# Patient Record
Sex: Female | Born: 1945 | Race: White | Hispanic: No | Smoking: Never smoker
Health system: Southern US, Community
[De-identification: ages and names within clinical notes are randomized; demographics above are authoritative.]

## PROBLEM LIST (undated history)

## (undated) DIAGNOSIS — M797 Fibromyalgia: Secondary | ICD-10-CM

## (undated) DIAGNOSIS — E0500 Thyrotoxicosis with diffuse goiter without thyrotoxic crisis or storm: Secondary | ICD-10-CM

## (undated) DIAGNOSIS — H47399 Other disorders of optic disc, unspecified eye: Secondary | ICD-10-CM

## (undated) DIAGNOSIS — I2510 Atherosclerotic heart disease of native coronary artery without angina pectoris: Secondary | ICD-10-CM

## (undated) DIAGNOSIS — H509 Unspecified strabismus: Secondary | ICD-10-CM

## (undated) DIAGNOSIS — H269 Unspecified cataract: Secondary | ICD-10-CM

## (undated) DIAGNOSIS — J342 Deviated nasal septum: Secondary | ICD-10-CM

## (undated) DIAGNOSIS — Z947 Corneal transplant status: Secondary | ICD-10-CM

## (undated) DIAGNOSIS — M8580 Other specified disorders of bone density and structure, unspecified site: Secondary | ICD-10-CM

## (undated) DIAGNOSIS — M810 Age-related osteoporosis without current pathological fracture: Secondary | ICD-10-CM

## (undated) DIAGNOSIS — R5382 Chronic fatigue, unspecified: Secondary | ICD-10-CM

## (undated) DIAGNOSIS — M5020 Other cervical disc displacement, unspecified cervical region: Secondary | ICD-10-CM

## (undated) DIAGNOSIS — J40 Bronchitis, not specified as acute or chronic: Secondary | ICD-10-CM

## (undated) DIAGNOSIS — I639 Cerebral infarction, unspecified: Secondary | ICD-10-CM

## (undated) DIAGNOSIS — G2581 Restless legs syndrome: Secondary | ICD-10-CM

## (undated) DIAGNOSIS — Z9889 Other specified postprocedural states: Secondary | ICD-10-CM

## (undated) DIAGNOSIS — C801 Malignant (primary) neoplasm, unspecified: Secondary | ICD-10-CM

## (undated) DIAGNOSIS — M1990 Unspecified osteoarthritis, unspecified site: Secondary | ICD-10-CM

## (undated) DIAGNOSIS — D869 Sarcoidosis, unspecified: Secondary | ICD-10-CM

## (undated) DIAGNOSIS — J189 Pneumonia, unspecified organism: Secondary | ICD-10-CM

## (undated) DIAGNOSIS — B1920 Unspecified viral hepatitis C without hepatic coma: Secondary | ICD-10-CM

## (undated) DIAGNOSIS — I10 Essential (primary) hypertension: Secondary | ICD-10-CM

## (undated) DIAGNOSIS — Z961 Presence of intraocular lens: Secondary | ICD-10-CM

## (undated) DIAGNOSIS — H0520 Unspecified exophthalmos: Secondary | ICD-10-CM

## (undated) DIAGNOSIS — M4310 Spondylolisthesis, site unspecified: Secondary | ICD-10-CM

## (undated) DIAGNOSIS — T7840XA Allergy, unspecified, initial encounter: Secondary | ICD-10-CM

## (undated) HISTORY — DX: Atherosclerotic heart disease of native coronary artery without angina pectoris: I25.10

## (undated) HISTORY — PX: SPINE SURGERY: SHX786

## (undated) HISTORY — DX: Chronic fatigue, unspecified: R53.82

## (undated) HISTORY — DX: Other specified postprocedural states: Z98.890

## (undated) HISTORY — DX: Pneumonia, unspecified organism: J18.9

## (undated) HISTORY — DX: Sarcoidosis, unspecified: D86.9

## (undated) HISTORY — DX: Essential (primary) hypertension: I10

## (undated) HISTORY — DX: Corneal transplant status: Z94.7

## (undated) HISTORY — PX: COLONOSCOPY: SHX174

## (undated) HISTORY — DX: Cerebral infarction, unspecified: I63.9

## (undated) HISTORY — DX: Deviated nasal septum: J34.2

## (undated) HISTORY — DX: Unspecified strabismus: H50.9

## (undated) HISTORY — DX: Other cervical disc displacement, unspecified cervical region: M50.20

## (undated) HISTORY — DX: Other disorders of optic disc, unspecified eye: H47.399

## (undated) HISTORY — DX: Presence of intraocular lens: Z96.1

## (undated) HISTORY — DX: Other specified disorders of bone density and structure, unspecified site: M85.80

## (undated) HISTORY — DX: Unspecified cataract: H26.9

## (undated) HISTORY — DX: Unspecified osteoarthritis, unspecified site: M19.90

## (undated) HISTORY — DX: Fibromyalgia: M79.7

## (undated) HISTORY — DX: Bronchitis, not specified as acute or chronic: J40

## (undated) HISTORY — DX: Unspecified exophthalmos: H05.20

## (undated) HISTORY — DX: Malignant (primary) neoplasm, unspecified: C80.1

## (undated) HISTORY — DX: Unspecified viral hepatitis C without hepatic coma: B19.20

## (undated) HISTORY — DX: Age-related osteoporosis without current pathological fracture: M81.0

## (undated) HISTORY — PX: EYE SURGERY: SHX253

## (undated) HISTORY — DX: Restless legs syndrome: G25.81

## (undated) HISTORY — DX: Thyrotoxicosis with diffuse goiter without thyrotoxic crisis or storm: E05.00

## (undated) HISTORY — DX: Spondylolisthesis, site unspecified: M43.10

## (undated) HISTORY — DX: Allergy, unspecified, initial encounter: T78.40XA

---

## 1972-07-24 HISTORY — PX: OTHER SURGICAL HISTORY: SHX169

## 1973-07-24 HISTORY — PX: CHOLECYSTECTOMY: SHX55

## 1978-07-24 DIAGNOSIS — D869 Sarcoidosis, unspecified: Secondary | ICD-10-CM

## 1978-07-24 HISTORY — DX: Sarcoidosis, unspecified: D86.9

## 1984-07-24 DIAGNOSIS — M5020 Other cervical disc displacement, unspecified cervical region: Secondary | ICD-10-CM

## 1984-07-24 HISTORY — PX: LAMINECTOMY: SHX219

## 1984-07-24 HISTORY — PX: LUMBAR DISC SURGERY: SHX700

## 1984-07-24 HISTORY — DX: Other cervical disc displacement, unspecified cervical region: M50.20

## 1992-07-24 DIAGNOSIS — J189 Pneumonia, unspecified organism: Secondary | ICD-10-CM

## 1992-07-24 DIAGNOSIS — J40 Bronchitis, not specified as acute or chronic: Secondary | ICD-10-CM

## 1992-07-24 HISTORY — DX: Pneumonia, unspecified organism: J18.9

## 1992-07-24 HISTORY — DX: Bronchitis, not specified as acute or chronic: J40

## 1998-07-24 DIAGNOSIS — Z9889 Other specified postprocedural states: Secondary | ICD-10-CM

## 1998-07-24 HISTORY — PX: OTHER SURGICAL HISTORY: SHX169

## 1998-07-24 HISTORY — PX: ABDOMINAL HYSTERECTOMY: SHX81

## 1998-07-24 HISTORY — DX: Other specified postprocedural states: Z98.890

## 1999-07-25 DIAGNOSIS — E0500 Thyrotoxicosis with diffuse goiter without thyrotoxic crisis or storm: Secondary | ICD-10-CM

## 1999-07-25 HISTORY — DX: Thyrotoxicosis with diffuse goiter without thyrotoxic crisis or storm: E05.00

## 2003-07-25 DIAGNOSIS — M8580 Other specified disorders of bone density and structure, unspecified site: Secondary | ICD-10-CM

## 2003-07-25 DIAGNOSIS — M797 Fibromyalgia: Secondary | ICD-10-CM

## 2003-07-25 DIAGNOSIS — I2510 Atherosclerotic heart disease of native coronary artery without angina pectoris: Secondary | ICD-10-CM

## 2003-07-25 DIAGNOSIS — H47399 Other disorders of optic disc, unspecified eye: Secondary | ICD-10-CM

## 2003-07-25 DIAGNOSIS — R5382 Chronic fatigue, unspecified: Secondary | ICD-10-CM

## 2003-07-25 DIAGNOSIS — G2581 Restless legs syndrome: Secondary | ICD-10-CM

## 2003-07-25 HISTORY — DX: Other disorders of optic disc, unspecified eye: H47.399

## 2003-07-25 HISTORY — DX: Chronic fatigue, unspecified: R53.82

## 2003-07-25 HISTORY — DX: Atherosclerotic heart disease of native coronary artery without angina pectoris: I25.10

## 2003-07-25 HISTORY — DX: Restless legs syndrome: G25.81

## 2003-07-25 HISTORY — DX: Other specified disorders of bone density and structure, unspecified site: M85.80

## 2003-07-25 HISTORY — DX: Fibromyalgia: M79.7

## 2007-07-25 DIAGNOSIS — Z961 Presence of intraocular lens: Secondary | ICD-10-CM

## 2007-07-25 HISTORY — PX: CARPAL TUNNEL RELEASE: SHX101

## 2007-07-25 HISTORY — DX: Presence of intraocular lens: Z96.1

## 2008-07-24 DIAGNOSIS — Z947 Corneal transplant status: Secondary | ICD-10-CM

## 2008-07-24 HISTORY — DX: Corneal transplant status: Z94.7

## 2009-07-24 DIAGNOSIS — H0520 Unspecified exophthalmos: Secondary | ICD-10-CM

## 2009-07-24 HISTORY — DX: Unspecified exophthalmos: H05.20

## 2009-07-24 HISTORY — PX: TOTAL THYROIDECTOMY: SHX2547

## 2011-03-25 DIAGNOSIS — J342 Deviated nasal septum: Secondary | ICD-10-CM

## 2011-03-25 HISTORY — DX: Deviated nasal septum: J34.2

## 2011-03-25 HISTORY — PX: OTHER SURGICAL HISTORY: SHX169

## 2011-07-25 DIAGNOSIS — H509 Unspecified strabismus: Secondary | ICD-10-CM

## 2011-07-25 HISTORY — DX: Unspecified strabismus: H50.9

## 2012-05-24 DIAGNOSIS — Z9889 Other specified postprocedural states: Secondary | ICD-10-CM

## 2012-05-24 HISTORY — DX: Other specified postprocedural states: Z98.890

## 2012-05-24 HISTORY — PX: SECONDARY INTRAOCULAR LENSE IMPLANTATION: SHX2390

## 2017-04-23 DIAGNOSIS — I2510 Atherosclerotic heart disease of native coronary artery without angina pectoris: Secondary | ICD-10-CM | POA: Insufficient documentation

## 2018-05-22 NOTE — Progress Notes (Signed)
Office Visit Note  Patient: Brooke Sanders             Date of Birth: 01-10-1946           MRN: [id number]             PCP: Jamesetta Orleans, PA-C Referring: Nickola Major Visit Date: 05/30/2018 Occupation: @GUAROCC @  Subjective:  Pain in multiple joints   History of Present Illness: Brooke Sanders is a 72 y.o. female seen in consultation per request of her PCP.  According to patient in 1980 she developed some cervical lymph nodes which were biopsied and they were consistent with sarcoidosis.  She was given prednisone but it was discontinued due to reaction to the prednisone.  She had followed by a pulmonologist as she developed pneumonia.  She was again given a course of prednisone but discontinued due to side effects.  She had repeated x-rays after that which she eventually became clear in 2002.  In 2005 she was diagnosed with fibromyalgia and chronic fatigue syndrome.  In 2012 she was started on Reclast infusions for osteoporosis.  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  In 2016 she had laminectomy.  And eventually fusion.  She was seen by a rheumatologist in 2015 who was treating her for fibromyalgia and sarcoidosis.  She was given Plaquenil for couple of years and then discontinued.  She was also treated with Lyrica for pain management.  That she has pain in all of her joints.  She also has generalized pain from fibromyalgia.  She has increased discomfort in her knee joints.  She states she has some swelling in her knee joints off and on.  She has been experiencing  increased fatigue.  She moved to [place] 1-1/2-year ago from [place].  Activities of Daily Living:  Patient reports morning stiffness for 30 minutes.   Patient Reports nocturnal pain.  Difficulty dressing/grooming: Denies Difficulty climbing stairs: Denies Difficulty getting out of chair: Denies Difficulty using hands for taps, buttons, cutlery,  and/or writing: Reports  Review of Systems  Constitutional: Positive for fatigue. Negative for night sweats, weight gain and weight loss.  HENT: Positive for mouth dryness. Negative for mouth sores, trouble swallowing, trouble swallowing and nose dryness.   Eyes: Positive for dryness. Negative for pain, redness and visual disturbance.  Respiratory: Negative for cough, shortness of breath and difficulty breathing.   Cardiovascular: Negative for chest pain, palpitations, hypertension, irregular heartbeat and swelling in legs/feet.  Gastrointestinal: Negative for blood in stool, constipation and diarrhea.  Endocrine: Negative for increased urination.  Genitourinary: Negative for difficulty urinating and vaginal dryness.  Musculoskeletal: Positive for arthralgias, joint pain, myalgias, morning stiffness and myalgias. Negative for joint swelling, muscle weakness and muscle tenderness.  Skin: Positive for rash and hair loss. Negative for color change, skin tightness, ulcers and sensitivity to sunlight.       Hives with change in barometric pressure  Allergic/Immunologic: Negative for susceptible to infections.  Neurological: Positive for numbness, headaches and weakness. Negative for dizziness, memory loss and night sweats.  Hematological: Negative for swollen glands.  Psychiatric/Behavioral: Positive for depressed mood and sleep disturbance. The patient is not nervous/anxious.     PMFS History:  There are no active problems to display for this patient.   Past Medical History:  Diagnosis Date  . Bilateral artificial lens implant 2009  . Bronchitis 1994  . Cervical herniated disc 1986  . Chronic  fatigue 2005  . 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 Coronary artery disease 2005  . Deviated septum 03/2011  . Fibromyalgia 2005  . Graves disease 2001  . Hepatitis C   . History of coronary angiogram 05/2012  . Hx of LASIK 2000  . Optic disc hemorrhage 2005  . Osteopenia 2005  . Pneumonia  1994  . Proptosis 2011  . Restless leg syndrome 2005  . Sarcoidosis 1980  . Spondylolisthesis   . Strabismus 2013    History reviewed. No pertinent family history. Past Surgical History:  Procedure Laterality Date  . ABDOMINAL HYSTERECTOMY  2000  . CARPAL TUNNEL RELEASE Right 2009  . CHOLECYSTECTOMY  1975  . cystic ectopic ovary  2000  . LAMINECTOMY  1986  . Dennis  . orbital decompression Bilateral 03/2011  . ossified ganglion surgery Right 1974  . SECONDARY INTRAOCULAR LENSE IMPLANTATION Right 05/2012  . TOTAL THYROIDECTOMY  2011   Social History   Social History Narrative  . Not on file    Objective: Vital Signs: BP 133/78 (BP Location: Left Arm, Patient Position: Sitting, Cuff Size: Normal)   Pulse 79   Ht 5\' 1"  (1.549 m)   Wt 140 lb (63.5 kg)   BMI 26.45 kg/m    Physical Exam  Constitutional: She is oriented to person, place, and time. She appears well-developed and well-nourished.  HENT:  Head: Normocephalic and atraumatic.  Eyes: Conjunctivae and EOM are normal.  Neck: Normal range of motion.  Cardiovascular: Normal rate, regular rhythm, normal heart sounds and intact distal pulses.  Pulmonary/Chest: Effort normal and breath sounds normal.  Abdominal: Soft. Bowel sounds are normal.  Lymphadenopathy:    She has no cervical adenopathy.  Neurological: She is alert and oriented to person, place, and time.  Skin: Skin is warm and dry. Capillary refill takes less than 2 seconds.  Psychiatric: She has a normal mood and affect. Her behavior is normal.  Nursing note and vitals reviewed.    Musculoskeletal Exam: C-spine good range of motion.  She has limited painful range of motion of lumbar spine.  Shoulder joints elbow joints wrist joints were in good range of motion.  She has some DIP and PIP thickening.  No synovitis was noted.  Hip joints were in good range of motion.  She is crepitus in her knee joints without any warmth swelling or effusion.   Ankle joints MTPs PIPs were in good range of motion.  She had generalized hyperalgesia.   CDAI Exam: CDAI Score: Not documented Patient Global Assessment: Not documented; Provider Global Assessment: Not documented Swollen: Not documented; Tender: Not documented Joint Exam   Not documented   There is currently no information documented on the homunculus. Go to the Rheumatology activity and complete the homunculus joint exam.  Investigation: No additional findings.  Imaging: Xr Hand 2 View Left  Result Date: 05/30/2018 CMC, PIP and DIP narrowing was noted.  Severe narrowing of first and second DIP narrowing with subluxation was noted.  No MCP, intercarpal radiocarpal joint space narrowing was noted.  No erosive changes were noted. Impression: These findings are consistent with osteoarthritis of the hand.  Xr Hand 2 View Right  Result Date: 05/30/2018 PIP and DIP narrowing was noted.  No MCP joint narrowing was noted.  No intercarpal radiocarpal joint space narrowing was noted.  No erosive changes were noted. Impression: These findings are consistent with osteoarthritis of the hand.  Xr Knee 3 View Left  Result Date:  05/30/2018 Moderate medial compartment narrowing.  No chondrocalcinosis was noted.  Moderate patellofemoral narrowing was noted. 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  Xr Knee 3 View Right  Result Date: 05/30/2018 Moderate medial compartment narrowing.  No chondrocalcinosis was noted.  Moderate patellofemoral narrowing was noted. 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   Recent Labs: No results found for: WBC, HGB, PLT, NA, K, CL, CO2, GLUCOSE, BUN, CREATININE, BILITOT, ALKPHOS, AST, ALT, PROT, ALBUMIN, CALCIUM, GFRAA, QFTBGOLD, QFTBGOLDPLUS  Speciality Comments: No specialty comments available.  Procedures:  No procedures performed Allergies: Prednisone    Assessment / Plan:     Visit Diagnoses: Sarcoidosis -patient was diagnosed with sarcoidosis in 1980 after positive cervical lymph node biopsy.  She had one episode of pneumonia with possibility of sarcoidosis.  She was given prednisone but could not tolerate it.  She had no recurrence of the symptoms.  She had a yearly chest x-ray for a while.  She was also treated by rheumatologist with Plaquenil for couple of years.  She has no increased shortness of breath currently.  She had no palpable lymph nodes.  I do not see any synovitis on examination.  She has been experiencing arthralgias.  Plan: Urinalysis, Routine w reflex microscopic, Sedimentation rate, Angiotensin converting enzyme, DG Chest 2 View  Pain in both hands -clinical findings are consistent with osteoarthritis with DIP and PIP thickening.  No synovitis was noted.  Plan: XR Hand 2 View Right, XR Hand 2 View Left, x-ray of bilateral hands were consistent with osteoarthritis of the hand.  Sedimentation rate, Rheumatoid factor, ANA  Chronic pain of both knees -patient complains of chronic pain and discomfort in her bilateral knee joints.  No warmth swelling or effusion was noted.  She is crepitus in her bilateral knee joints.  Plan: XR KNEE 3 VIEW RIGHT, XR KNEE 3 VIEW LEFT.  The x-ray of the knee joint showed moderate osteoarthritis and moderate chondral malacia patella.  Fibromyalgia-she has long-standing history of fibromyalgia with generalized pain and discomfort.  She has been treated in [place] for many years with the different medications.  Spinal stenosis of lumbar region without neurogenic claudication-patient had several surgeries including laminectomies and fusion of her lumbar spine.  Chronic fatigue - Plan: CBC with Differential/Platelet, COMPLETE METABOLIC PANEL WITH GFR, CK  History of hypothyroidism  History of coronary artery disease  Essential hypertension-her systolic blood pressure is mildly elevated  today.  History of hyperlipidemia  Age-related osteoporosis without current pathological fracture -patient reports that osteoporosis was induced due to treatment of Graves' disease.  She was also on Lyrica for many years.  Reclast this started in 2012   Orders: Orders Placed This Encounter  Procedures  . XR Hand 2 View Right  . XR Hand 2 View Left  . XR KNEE 3 VIEW RIGHT  . XR KNEE 3 VIEW LEFT  . DG Chest 2 View  . CBC with Differential/Platelet  . COMPLETE METABOLIC PANEL WITH GFR  . Urinalysis, Routine w reflex microscopic  . CK  . Sedimentation rate  . Rheumatoid factor  . ANA  . Angiotensin converting enzyme   No orders of the defined types were placed in this encounter.   Face-to-face time spent with patient was 50 minutes. Greater than 50% of time was spent in counseling and coordination of care.  Follow-Up Instructions: Return for sarcoidosis , OA, FMS.   Bo Merino, MD  Note - This record has  been created using [employer].  Chart creation errors have been sought, but may not always  have been located. Such creation errors do not reflect on  the standard of medical care.

## 2018-05-30 ENCOUNTER — Ambulatory Visit (INDEPENDENT_AMBULATORY_CARE_PROVIDER_SITE_OTHER): Payer: Medicare Other

## 2018-05-30 ENCOUNTER — Ambulatory Visit (INDEPENDENT_AMBULATORY_CARE_PROVIDER_SITE_OTHER): Payer: Medicare Other | Admitting: Rheumatology

## 2018-05-30 ENCOUNTER — Encounter: Payer: Self-pay | Admitting: Rheumatology

## 2018-05-30 ENCOUNTER — Ambulatory Visit (INDEPENDENT_AMBULATORY_CARE_PROVIDER_SITE_OTHER): Payer: Self-pay

## 2018-05-30 VITALS — BP 133/78 | HR 79 | Ht 61.0 in | Wt 140.0 lb

## 2018-05-30 DIAGNOSIS — M79642 Pain in left hand: Secondary | ICD-10-CM

## 2018-05-30 DIAGNOSIS — M79641 Pain in right hand: Secondary | ICD-10-CM

## 2018-05-30 DIAGNOSIS — Z8639 Personal history of other endocrine, nutritional and metabolic disease: Secondary | ICD-10-CM

## 2018-05-30 DIAGNOSIS — G8929 Other chronic pain: Secondary | ICD-10-CM | POA: Diagnosis not present

## 2018-05-30 DIAGNOSIS — D869 Sarcoidosis, unspecified: Secondary | ICD-10-CM | POA: Diagnosis not present

## 2018-05-30 DIAGNOSIS — M48061 Spinal stenosis, lumbar region without neurogenic claudication: Secondary | ICD-10-CM

## 2018-05-30 DIAGNOSIS — M797 Fibromyalgia: Secondary | ICD-10-CM | POA: Diagnosis not present

## 2018-05-30 DIAGNOSIS — M810 Age-related osteoporosis without current pathological fracture: Secondary | ICD-10-CM

## 2018-05-30 DIAGNOSIS — I10 Essential (primary) hypertension: Secondary | ICD-10-CM

## 2018-05-30 DIAGNOSIS — M25561 Pain in right knee: Secondary | ICD-10-CM

## 2018-05-30 DIAGNOSIS — R5382 Chronic fatigue, unspecified: Secondary | ICD-10-CM

## 2018-05-30 DIAGNOSIS — M25562 Pain in left knee: Secondary | ICD-10-CM | POA: Diagnosis not present

## 2018-05-30 DIAGNOSIS — Z8679 Personal history of other diseases of the circulatory system: Secondary | ICD-10-CM

## 2018-05-30 NOTE — Patient Instructions (Signed)
Knee Exercises Ask your health care provider which exercises are safe for you. Do exercises exactly as told by your health care provider and adjust them as directed. It is normal to feel mild stretching, pulling, tightness, or discomfort as you do these exercises, but you should stop right away if you feel sudden pain or your pain gets worse.Do not begin these exercises until told by your health care provider. STRETCHING AND RANGE OF MOTION EXERCISES These exercises warm up your muscles and joints and improve the movement and flexibility of your knee. These exercises also help to relieve pain, numbness, and tingling. Exercise A: Knee Extension, Prone 1. Lie on your abdomen on a bed. 2. Place your left / right knee just beyond the edge of the surface so your knee is not on the bed. You can put a towel under your left / right thigh just above your knee for comfort. 3. Relax your leg muscles and allow gravity to straighten your knee. You should feel a stretch behind your left / right knee. 4. Hold this position for __________ seconds. 5. Scoot up so your knee is supported between repetitions. Repeat __________ times. Complete this stretch __________ times a day. Exercise B: Knee Flexion, Active  1. Lie on your back with both knees straight. If this causes back discomfort, bend your left / right knee so your foot is flat on the floor. 2. Slowly slide your left / right heel back toward your buttocks until you feel a gentle stretch in the front of your knee or thigh. 3. Hold this position for __________ seconds. 4. Slowly slide your left / right heel back to the starting position. Repeat __________ times. Complete this exercise __________ times a day. Exercise C: Quadriceps, Prone  1. Lie on your abdomen on a firm surface, such as a bed or padded floor. 2. Bend your left / right knee and hold your ankle. If you cannot reach your ankle or pant leg, loop a belt around your foot and grab the belt  instead. 3. Gently pull your heel toward your buttocks. Your knee should not slide out to the side. You should feel a stretch in the front of your thigh and knee. 4. Hold this position for __________ seconds. Repeat __________ times. Complete this stretch __________ times a day. Exercise D: Hamstring, Supine 1. Lie on your back. 2. Loop a belt or towel over the ball of your left / right foot. The ball of your foot is on the walking surface, right under your toes. 3. Straighten your left / right knee and slowly pull on the belt to raise your leg until you feel a gentle stretch behind your knee. ? Do not let your left / right knee bend while you do this. ? Keep your other leg flat on the floor. 4. Hold this position for __________ seconds. Repeat __________ times. Complete this stretch __________ times a day. STRENGTHENING EXERCISES These exercises build strength and endurance in your knee. Endurance is the ability to use your muscles for a long time, even after they get tired. Exercise E: Quadriceps, Isometric  1. Lie on your back with your left / right leg extended and your other knee bent. Put a rolled towel or small pillow under your knee if told by your health care provider. 2. Slowly tense the muscles in the front of your left / right thigh. You should see your kneecap slide up toward your hip or see increased dimpling just above the knee. This   motion will push the back of the knee toward the floor. 3. For __________ seconds, keep the muscle as tight as you can without increasing your pain. 4. Relax the muscles slowly and completely. Repeat __________ times. Complete this exercise __________ times a day. Exercise F: Straight Leg Raises - Quadriceps 1. Lie on your back with your left / right leg extended and your other knee bent. 2. Tense the muscles in the front of your left / right thigh. You should see your kneecap slide up or see increased dimpling just above the knee. Your thigh may  even shake a bit. 3. Keep these muscles tight as you raise your leg 4-6 inches (10-15 cm) off the floor. Do not let your knee bend. 4. Hold this position for __________ seconds. 5. Keep these muscles tense as you lower your leg. 6. Relax your muscles slowly and completely after each repetition. Repeat __________ times. Complete this exercise __________ times a day. Exercise G: Hamstring, Isometric 1. Lie on your back on a firm surface. 2. Bend your left / right knee approximately __________ degrees. 3. Dig your left / right heel into the surface as if you are trying to pull it toward your buttocks. Tighten the muscles in the back of your thighs to dig as hard as you can without increasing any pain. 4. Hold this position for __________ seconds. 5. Release the tension gradually and allow your muscles to relax completely for __________ seconds after each repetition. Repeat __________ times. Complete this exercise __________ times a day. Exercise H: Hamstring Curls  If told by your health care provider, do this exercise while wearing ankle weights. Begin with __________ weights. Then increase the weight by 1 lb (0.5 kg) increments. Do not wear ankle weights that are more than __________. 1. Lie on your abdomen with your legs straight. 2. Bend your left / right knee as far as you can without feeling pain. Keep your hips flat against the floor. 3. Hold this position for __________ seconds. 4. Slowly lower your leg to the starting position.  Repeat __________ times. Complete this exercise __________ times a day. Exercise I: Squats (Quadriceps) 1. Stand in front of a table, with your feet and knees pointing straight ahead. You may rest your hands on the table for balance but not for support. 2. Slowly bend your knees and lower your hips like you are going to sit in a chair. ? Keep your weight over your heels, not over your toes. ? Keep your lower legs upright so they are parallel with the table  legs. ? Do not let your hips go lower than your knees. ? Do not bend lower than told by your health care provider. ? If your knee pain increases, do not bend as low. 3. Hold the squat position for __________ seconds. 4. Slowly push with your legs to return to standing. Do not use your hands to pull yourself to standing. Repeat __________ times. Complete this exercise __________ times a day. Exercise J: Wall Slides (Quadriceps)  1. Lean your back against a smooth wall or door while you walk your feet out 18-24 inches (46-61 cm) from it. 2. Place your feet hip-width apart. 3. Slowly slide down the wall or door until your knees bend __________ degrees. Keep your knees over your heels, not over your toes. Keep your knees in line with your hips. 4. Hold for __________ seconds. Repeat __________ times. Complete this exercise __________ times a day. Exercise K: Straight Leg Raises -   Hip Abductors 1. Lie on your side with your left / right leg in the top position. Lie so your head, shoulder, knee, and hip line up. You may bend your bottom knee to help you keep your balance. 2. Roll your hips slightly forward so your hips are stacked directly over each other and your left / right knee is facing forward. 3. Leading with your heel, lift your top leg 4-6 inches (10-15 cm). You should feel the muscles in your outer hip lifting. ? Do not let your foot drift forward. ? Do not let your knee roll toward the ceiling. 4. Hold this position for __________ seconds. 5. Slowly return your leg to the starting position. 6. Let your muscles relax completely after each repetition. Repeat __________ times. Complete this exercise __________ times a day. Exercise L: Straight Leg Raises - Hip Extensors 1. Lie on your abdomen on a firm surface. You can put a pillow under your hips if that is more comfortable. 2. Tense the muscles in your buttocks and lift your left / right leg about 4-6 inches (10-15 cm). Keep your knee  straight as you lift your leg. 3. Hold this position for __________ seconds. 4. Slowly lower your leg to the starting position. 5. Let your leg relax completely after each repetition. Repeat __________ times. Complete this exercise __________ times a day. This information is not intended to replace advice given to you by your health care provider. Make sure you discuss any questions you have with your health care provider. Document Released: 05/24/2005 Document Revised: 04/03/2016 Document Reviewed: 05/16/2015 Elsevier Interactive Patient Education  2018 [employer] Hand Exercises Hand exercises can be helpful to almost anyone. These exercises can strengthen the hands, improve flexibility and movement, and increase blood flow to the hands. These results can make work and daily tasks easier. Hand exercises can be especially helpful for people who have joint pain from arthritis or have nerve damage from overuse (carpal tunnel syndrome). These exercises can also help people who have injured a hand. Most of these hand exercises are fairly gentle stretching routines. You can do them often throughout the day. Still, it is a good idea to ask your health care provider which exercises would be best for you. Warming your hands before exercise may help to reduce stiffness. You can do this with gentle massage or by placing your hands in warm water for 15 minutes. Also, make sure you pay attention to your level of hand pain as you begin an exercise routine. Exercises Knuckle Bend Repeat this exercise 5-10 times with each hand. 1. Stand or sit with your arm, hand, and all five fingers pointed straight up. Make sure your wrist is straight. 2. Gently and slowly bend your fingers down and inward until the tips of your fingers are touching the tops of your palm. 3. Hold this position for a few seconds. 4. Extend your fingers out to their original position, all pointing straight up again.  Finger Fan Repeat this  exercise 5-10 times with each hand. 1. Hold your arm and hand out in front of you. Keep your wrist straight. 2. Squeeze your hand into a fist. 3. Hold this position for a few seconds. 4. Fan out, or spread apart, your hand and fingers as much as possible, stretching every joint fully.  Tabletop Repeat this exercise 5-10 times with each hand. 1. Stand or sit with your arm, hand, and all five fingers pointed straight up. Make sure your wrist is straight.   2. Gently and slowly bend your fingers at the knuckles where they meet the hand until your hand is making an upside-down L shape. Your fingers should form a tabletop. 3. Hold this position for a few seconds. 4. Extend your fingers out to their original position, all pointing straight up again.  Making Os Repeat this exercise 5-10 times with each hand. 1. Stand or sit with your arm, hand, and all five fingers pointed straight up. Make sure your wrist is straight. 2. Make an O shape by touching your pointer finger to your thumb. Hold for a few seconds. Then open your hand wide. 3. Repeat this motion with each finger on your hand.  Table Spread Repeat this exercise 5-10 times with each hand. 1. Place your hand on a table with your palm facing down. Make sure your wrist is straight. 2. Spread your fingers out as much as possible. Hold this position for a few seconds. 3. Slide your fingers back together again. Hold for a few seconds.  Ball Grip  Repeat this exercise 10-15 times with each hand. 1. Hold a tennis ball or another soft ball in your hand. 2. While slowly increasing pressure, squeeze the ball as hard as possible. 3. Squeeze as hard as you can for 3-5 seconds. 4. Relax and repeat.  Wrist Curls Repeat this exercise 10-15 times with each hand. 1. Sit in a chair that has armrests. 2. Hold a light weight in your hand, such as a dumbbell that weighs 1-3 pounds (0.5-1.4 kg). Ask your health care provider what weight would be best for  you. 3. Rest your hand just over the end of the chair arm with your palm facing up. 4. Gently pivot your wrist up and down while holding the weight. Do not twist your wrist from side to side.  Contact a health care provider if:  Your hand pain or discomfort gets much worse when you do an exercise.  Your hand pain or discomfort does not improve within 2 hours after you exercise. If you have any of these problems, stop doing these exercises right away. Do not do them again unless your health care provider says that you can. Get help right away if:  You develop sudden, severe hand pain. If this happens, stop doing these exercises right away. Do not do them again unless your health care provider says that you can. This information is not intended to replace advice given to you by your health care provider. Make sure you discuss any questions you have with your health care provider. Document Released: 06/21/2015 Document Revised: 12/16/2015 Document Reviewed: 01/18/2015 Elsevier Interactive Patient Education  2018 [employer]  

## 2018-06-03 LAB — CBC WITH DIFFERENTIAL/PLATELET
BASOS ABS: 42 {cells}/uL (ref 0–200)
Basophils Relative: 0.4 %
EOS PCT: 0.4 %
Eosinophils Absolute: 42 cells/uL (ref 15–500)
HCT: 40.6 % (ref 35.0–45.0)
HEMOGLOBIN: 13.9 g/dL (ref 11.7–15.5)
Lymphs Abs: 1924 cells/uL (ref 850–3900)
MCH: 30.2 pg (ref 27.0–33.0)
MCHC: 34.2 g/dL (ref 32.0–36.0)
MCV: 88.1 fL (ref 80.0–100.0)
MONOS PCT: 9.1 %
MPV: 11.1 fL (ref 7.5–12.5)
NEUTROS ABS: 7446 {cells}/uL (ref 1500–7800)
NEUTROS PCT: 71.6 %
PLATELETS: 267 10*3/uL (ref 140–400)
RBC: 4.61 10*6/uL (ref 3.80–5.10)
RDW: 11.5 % (ref 11.0–15.0)
Total Lymphocyte: 18.5 %
WBC mixed population: 946 cells/uL (ref 200–950)
WBC: 10.4 10*3/uL (ref 3.8–10.8)

## 2018-06-03 LAB — COMPLETE METABOLIC PANEL WITH GFR
AG Ratio: 1.8 (calc) (ref 1.0–2.5)
ALT: 14 U/L (ref 6–29)
AST: 22 U/L (ref 10–35)
Albumin: 4.8 g/dL (ref 3.6–5.1)
Alkaline phosphatase (APISO): 55 U/L (ref 33–130)
BUN: 14 mg/dL (ref 7–25)
CALCIUM: 10 mg/dL (ref 8.6–10.4)
CO2: 31 mmol/L (ref 20–32)
CREATININE: 0.86 mg/dL (ref 0.60–0.93)
Chloride: 88 mmol/L — ABNORMAL LOW (ref 98–110)
GFR, EST NON AFRICAN AMERICAN: 67 mL/min/{1.73_m2} (ref >=60)
GFR, Est African American: 78 mL/min/{1.73_m2} (ref >=60)
GLOBULIN: 2.6 g/dL (ref 1.9–3.7)
GLUCOSE: 87 mg/dL (ref 65–99)
Potassium: 3.7 mmol/L (ref 3.5–5.3)
SODIUM: 130 mmol/L — AB (ref 135–146)
Total Bilirubin: 0.9 mg/dL (ref 0.2–1.2)
Total Protein: 7.4 g/dL (ref 6.1–8.1)

## 2018-06-03 LAB — ANTI-NUCLEAR AB-TITER (ANA TITER)

## 2018-06-03 LAB — RHEUMATOID FACTOR: Rhuematoid fact SerPl-aCnc: <14 IU/mL (ref <14)

## 2018-06-03 LAB — URINALYSIS, ROUTINE W REFLEX MICROSCOPIC
BILIRUBIN URINE: NEGATIVE
Bacteria, UA: NONE SEEN /HPF
Glucose, UA: NEGATIVE
Hgb urine dipstick: NEGATIVE
Hyaline Cast: NONE SEEN /LPF
NITRITE: NEGATIVE
PROTEIN: NEGATIVE
SQUAMOUS EPITHELIAL / LPF: NONE SEEN /HPF (ref <=5)
Specific Gravity, Urine: 1.02 (ref 1.001–1.03)
pH: 5.5 (ref 5.0–8.0)

## 2018-06-03 LAB — ANA: Anti Nuclear Antibody(ANA): POSITIVE — AB

## 2018-06-03 LAB — ANGIOTENSIN CONVERTING ENZYME: Angiotensin-Converting Enzyme: 23 U/L (ref 9–67)

## 2018-06-03 LAB — CK: Total CK: 72 U/L (ref 29–143)

## 2018-06-03 LAB — SEDIMENTATION RATE: SED RATE: 9 mm/h (ref 0–30)

## 2018-06-03 NOTE — Progress Notes (Signed)
Will discuss labs at the fu visit.

## 2018-06-04 ENCOUNTER — Ambulatory Visit (HOSPITAL_BASED_OUTPATIENT_CLINIC_OR_DEPARTMENT_OTHER)
Admission: RE | Admit: 2018-06-04 | Discharge: 2018-06-04 | Disposition: A | Discharge status: Home / Self Care | Payer: Medicare Other | Source: Ambulatory Visit | Attending: Rheumatology | Admitting: Rheumatology

## 2018-06-04 DIAGNOSIS — D869 Sarcoidosis, unspecified: Secondary | ICD-10-CM

## 2018-06-04 NOTE — Progress Notes (Signed)
CXR normal.

## 2018-06-06 DIAGNOSIS — Z9109 Other allergy status, other than to drugs and biological substances: Secondary | ICD-10-CM | POA: Insufficient documentation

## 2018-06-24 DIAGNOSIS — M5136 Other intervertebral disc degeneration, lumbar region: Secondary | ICD-10-CM | POA: Insufficient documentation

## 2018-06-24 DIAGNOSIS — D869 Sarcoidosis, unspecified: Secondary | ICD-10-CM | POA: Insufficient documentation

## 2018-06-24 DIAGNOSIS — Z8679 Personal history of other diseases of the circulatory system: Secondary | ICD-10-CM | POA: Insufficient documentation

## 2018-06-24 DIAGNOSIS — M51369 Other intervertebral disc degeneration, lumbar region without mention of lumbar back pain or lower extremity pain: Secondary | ICD-10-CM | POA: Insufficient documentation

## 2018-06-24 DIAGNOSIS — E890 Postprocedural hypothyroidism: Secondary | ICD-10-CM | POA: Insufficient documentation

## 2018-06-24 DIAGNOSIS — M19041 Primary osteoarthritis, right hand: Secondary | ICD-10-CM | POA: Insufficient documentation

## 2018-06-24 DIAGNOSIS — M810 Age-related osteoporosis without current pathological fracture: Secondary | ICD-10-CM | POA: Insufficient documentation

## 2018-06-24 DIAGNOSIS — I10 Essential (primary) hypertension: Secondary | ICD-10-CM | POA: Insufficient documentation

## 2018-06-24 DIAGNOSIS — M797 Fibromyalgia: Secondary | ICD-10-CM | POA: Insufficient documentation

## 2018-06-24 DIAGNOSIS — M19042 Primary osteoarthritis, left hand: Secondary | ICD-10-CM

## 2018-06-24 DIAGNOSIS — M170 Bilateral primary osteoarthritis of knee: Secondary | ICD-10-CM | POA: Insufficient documentation

## 2018-06-24 DIAGNOSIS — Z8639 Personal history of other endocrine, nutritional and metabolic disease: Secondary | ICD-10-CM | POA: Insufficient documentation

## 2018-06-24 DIAGNOSIS — E039 Hypothyroidism, unspecified: Secondary | ICD-10-CM | POA: Insufficient documentation

## 2018-06-24 DIAGNOSIS — E785 Hyperlipidemia, unspecified: Secondary | ICD-10-CM | POA: Insufficient documentation

## 2018-06-24 NOTE — Progress Notes (Signed)
Office Visit Note  Patient: Brooke Sanders             Date of Birth: August 26, 1945           MRN: [id number]             PCP: Jamesetta Orleans, PA-C Referring: No ref. provider found Visit Date: 07/04/2018 Occupation: '@GUAROCC' @  Subjective:  Pain in joints   History of Present Illness: Brooke Sanders is a 72 y.o. female with history of sarcoidosis osteoarthritis and fibromyalgia.  Continues to have discomfort in her hands and knees due to underlying osteoarthritis.  Lower back pain also persist she has history of a spinal stenosis and had fusion x2.  She also has neck pain due to underlying disc disease per patient.  Her last bone density was in 2015.  She states she had Reclast from 2012 till 2015.  Activities of Daily Living:  Patient reports morning stiffness for 15 minutes.   Patient Denies nocturnal pain.  Difficulty dressing/grooming: Denies Difficulty climbing stairs: Denies Difficulty getting out of chair: Denies Difficulty using hands for taps, buttons, cutlery, and/or writing: Reports  Review of Systems  Constitutional: Negative for fatigue, night sweats, weight gain and weight loss.  HENT: Negative for mouth sores, trouble swallowing, trouble swallowing, mouth dryness and nose dryness.   Eyes: Positive for dryness. Negative for pain, redness and visual disturbance.  Respiratory: Negative for cough, shortness of breath and difficulty breathing.   Cardiovascular: Negative for chest pain, palpitations, hypertension, irregular heartbeat and swelling in legs/feet.  Gastrointestinal: Negative for blood in stool, constipation and diarrhea.  Endocrine: Negative for increased urination.  Genitourinary: Negative for difficulty urinating and vaginal dryness.  Musculoskeletal: Positive for arthralgias, joint pain, muscle weakness and morning stiffness. Negative for joint swelling, myalgias, muscle tenderness and myalgias.  Skin: Positive for rash. Negative for color change,  hair loss, skin tightness, ulcers and sensitivity to sunlight.  Allergic/Immunologic: Negative for susceptible to infections.  Neurological: Negative for dizziness, numbness, memory loss, night sweats and weakness.  Hematological: Negative for bruising/bleeding tendency and swollen glands.  Psychiatric/Behavioral: Negative for depressed mood and sleep disturbance. The patient is not nervous/anxious.     PMFS History:  Patient Active Problem List   Diagnosis Date Noted  . Sarcoidosis 06/24/2018  . Primary osteoarthritis of both hands 06/24/2018  . Primary osteoarthritis of both knees 06/24/2018  . DDD (degenerative disc disease), lumbar 06/24/2018  . Fibromyalgia 06/24/2018  . Age-related osteoporosis without current pathological fracture 06/24/2018  . Essential hypertension 06/24/2018  . Dyslipidemia 06/24/2018  . History of coronary artery disease 06/24/2018  . History of hypothyroidism 06/24/2018    Past Medical History:  Diagnosis Date  . Bilateral artificial lens implant 2009  . Bronchitis 1994  . Cervical herniated disc 1986  . Chronic fatigue 2005  . 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 Coronary artery disease 2005  . Deviated septum 03/2011  . Fibromyalgia 2005  . Graves disease 2001  . Hepatitis C   . History of coronary angiogram 05/2012  . Hx of LASIK 2000  . Optic disc hemorrhage 2005  . Osteopenia 2005  . Pneumonia 1994  . Proptosis 2011  . Restless leg syndrome 2005  . Sarcoidosis 1980  . Spondylolisthesis   . Strabismus 2013    History reviewed. No pertinent family history. Past Surgical History:  Procedure Laterality Date  . ABDOMINAL HYSTERECTOMY  2000  . CARPAL TUNNEL RELEASE Right 2009  . CHOLECYSTECTOMY  1975  .  cystic ectopic ovary  2000  . LAMINECTOMY  1986  . [place]  . orbital decompression Bilateral 03/2011  . ossified ganglion surgery Right 1974  . SECONDARY INTRAOCULAR LENSE IMPLANTATION Right 05/2012  . TOTAL  THYROIDECTOMY  2011   Social History   Social History Narrative  . Not on file    Objective: Vital Signs: BP 118/64 (BP Location: Left Arm, Patient Position: Sitting, Cuff Size: Normal)   Pulse 85   Resp 16   Ht '5\' 1"'  (1.549 m)   Wt 141 lb 9.6 oz (64.2 kg)   BMI 26.76 kg/m    Physical Exam Vitals signs and nursing note reviewed.  Constitutional:      Appearance: She is well-developed.  HENT:     Head: Normocephalic and atraumatic.  Eyes:     Conjunctiva/sclera: Conjunctivae normal.  Neck:     Musculoskeletal: Normal range of motion.  Cardiovascular:     Rate and Rhythm: Normal rate and regular rhythm.     Heart sounds: Normal heart sounds.  Pulmonary:     Effort: Pulmonary effort is normal.     Breath sounds: Normal breath sounds.  Abdominal:     General: Bowel sounds are normal.     Palpations: Abdomen is soft.  Lymphadenopathy:     Cervical: No cervical adenopathy.  Skin:    General: Skin is warm and dry.     Capillary Refill: Capillary refill takes less than 2 seconds.  Neurological:     Mental Status: She is alert and oriented to person, place, and time.  Psychiatric:        Behavior: Behavior normal.      Musculoskeletal Exam: She has limited range of motion of her cervical and lumbar spine.  Shoulder joints elbow joints wrist joints were in good range of motion.  She has bilateral PIP and DIP thickening consistent with osteoarthritis.  Hip joints and knee joints with good range of motion with no synovitis.  She had positive tender points on examination.  CDAI Exam: CDAI Score: Not documented Patient Global Assessment: Not documented; Provider Global Assessment: Not documented Swollen: Not documented; Tender: Not documented Joint Exam   Not documented   There is currently no information documented on the homunculus. Go to the Rheumatology activity and complete the homunculus joint exam.  Investigation: No additional findings.  Imaging: No results  found.  Recent Labs: Lab Results  Component Value Date   WBC 10.4 05/30/2018   HGB 13.9 05/30/2018   PLT 267 05/30/2018   NA 130 (L) 05/30/2018   K 3.7 05/30/2018   CL 88 (L) 05/30/2018   CO2 31 05/30/2018   GLUCOSE 87 05/30/2018   BUN 14 05/30/2018   CREATININE 0.86 05/30/2018   BILITOT 0.9 05/30/2018   AST 22 05/30/2018   ALT 14 05/30/2018   PROT 7.4 05/30/2018   CALCIUM 10.0 05/30/2018   GFRAA 78 05/30/2018   UA negative, ANA 1: 80 homogeneous, ACE 23, RF negative, ESR 9, CK 72 Speciality Comments: No specialty comments available.  Procedures:  No procedures performed Allergies: Prednisone   Assessment / Plan:     Visit Diagnoses: Sarcoidosis - Diagnosed in 1980 after positive cervical lymph node biopsy.  Treated by pulmonologist in the past with Plaquenil.  Her chest was clear to auscultation.  Her ACE level is normal.  Sedimentation rate is normal.  All the labs were reviewed with patient.  She has low titer positive ANA but no clinical features  of autoimmune disease at this point.  Primary osteoarthritis of both hands-joint protection muscle strengthening was discussed.  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  Muscle strengthening was discussed.  DDD (degenerative disc disease), cervical-she has limited range of motion with chronic discomfort.  DDD (degenerative disc disease), lumbar - With a spinal stenosis and neurogenic claudication.  status post fusion x2.  She continues to have some lower back pain.  Fibromyalgia-she has generalized pain positive tender points.  Need for regular exercise and stretching exercises were discussed.  Other fatigue-sleep hygiene was discussed.  It appears her fatigue may be related to insomnia.  Age-related osteoporosis without current pathological fracture - Due to Graves' disease per patient.  History of treatment with Lyrica.  Reclast since 2012. Last DXA in 2015.   I will schedule her bone density.  Use of calcium, vitamin D and resistive exercises was discussed.  Essential hypertension-her blood pressure is well controlled.  Dyslipidemia  History of coronary artery disease  History of hypothyroidism   Orders: Orders Placed This Encounter  Procedures  . DG BONE DENSITY (DXA)   No orders of the defined types were placed in this encounter.     Follow-Up Instructions: Return in about 6 months (around 01/03/2019) for sarcoidosis, , Osteoarthritis, Osteoporosis.   Bo Merino, MD  Note - This record has been created using Editor, commissioning.  Chart creation errors have been sought, but may not always  have been located. Such creation errors do not reflect on  the standard of medical care.

## 2018-07-04 ENCOUNTER — Ambulatory Visit (INDEPENDENT_AMBULATORY_CARE_PROVIDER_SITE_OTHER): Payer: Medicare Other | Admitting: Rheumatology

## 2018-07-04 ENCOUNTER — Encounter: Payer: Self-pay | Admitting: Rheumatology

## 2018-07-04 VITALS — BP 118/64 | HR 85 | Resp 16 | Ht 61.0 in | Wt 141.6 lb

## 2018-07-04 DIAGNOSIS — Z8639 Personal history of other endocrine, nutritional and metabolic disease: Secondary | ICD-10-CM

## 2018-07-04 DIAGNOSIS — M67431 Ganglion, right wrist: Secondary | ICD-10-CM | POA: Insufficient documentation

## 2018-07-04 DIAGNOSIS — M810 Age-related osteoporosis without current pathological fracture: Secondary | ICD-10-CM

## 2018-07-04 DIAGNOSIS — D869 Sarcoidosis, unspecified: Secondary | ICD-10-CM

## 2018-07-04 DIAGNOSIS — M19041 Primary osteoarthritis, right hand: Secondary | ICD-10-CM | POA: Diagnosis not present

## 2018-07-04 DIAGNOSIS — R5383 Other fatigue: Secondary | ICD-10-CM

## 2018-07-04 DIAGNOSIS — M5030 Other cervical disc degeneration, unspecified cervical region: Secondary | ICD-10-CM | POA: Diagnosis not present

## 2018-07-04 DIAGNOSIS — M797 Fibromyalgia: Secondary | ICD-10-CM

## 2018-07-04 DIAGNOSIS — E785 Hyperlipidemia, unspecified: Secondary | ICD-10-CM

## 2018-07-04 DIAGNOSIS — Z8679 Personal history of other diseases of the circulatory system: Secondary | ICD-10-CM

## 2018-07-04 DIAGNOSIS — M170 Bilateral primary osteoarthritis of knee: Secondary | ICD-10-CM | POA: Diagnosis not present

## 2018-07-04 DIAGNOSIS — I10 Essential (primary) hypertension: Secondary | ICD-10-CM

## 2018-07-04 DIAGNOSIS — M5136 Other intervertebral disc degeneration, lumbar region: Secondary | ICD-10-CM

## 2018-07-04 DIAGNOSIS — M19042 Primary osteoarthritis, left hand: Secondary | ICD-10-CM

## 2018-08-21 ENCOUNTER — Telehealth: Payer: Self-pay | Admitting: Rheumatology

## 2018-08-21 NOTE — Telephone Encounter (Signed)
Patient has been provided with information to [facility] so she may schedule her bone density Scan.

## 2018-08-21 NOTE — Telephone Encounter (Signed)
Patient left a voicemail stating she had to leave town for 3 weeks and was unable to schedule her bone density scan.  Patient is requesting a return call.

## 2018-09-03 ENCOUNTER — Ambulatory Visit (HOSPITAL_BASED_OUTPATIENT_CLINIC_OR_DEPARTMENT_OTHER)
Admission: RE | Admit: 2018-09-03 | Discharge: 2018-09-03 | Disposition: A | Discharge status: Home / Self Care | Payer: Medicare Other | Source: Ambulatory Visit | Attending: Rheumatology | Admitting: Rheumatology

## 2018-09-03 DIAGNOSIS — M810 Age-related osteoporosis without current pathological fracture: Secondary | ICD-10-CM | POA: Diagnosis not present

## 2018-09-03 NOTE — Progress Notes (Signed)
DXA is consistent with osteopenia.  She may try calcium, vitamin D and resistive exercises.  She will need repeat bone density in 2 years.

## 2018-12-24 NOTE — Progress Notes (Signed)
Office Visit Note  Patient: Brooke Sanders             Date of Birth: 1945-08-15           MRN: [id number]             PCP: Jamesetta Orleans, PA-C Referring: Nickola Major Visit Date: 01/02/2019 Occupation: @GUAROCC @  Subjective:  Pain in multiple joints.   History of Present Illness: Brooke Sanders is a 73 y.o. female with history of osteoarthritis.  She states she continues to have pain and stiffness in her hands, bilateral knee joints and her lower back.  She denies any shortness of breath.  She has longstanding history of fibromyalgia which causes generalized pain and discomfort.  Activities of Daily Living:  Patient reports morning stiffness for 5 minutes.   Patient Reports nocturnal pain.  Difficulty dressing/grooming: Denies Difficulty climbing stairs: Denies Difficulty getting out of chair: Denies Difficulty using hands for taps, buttons, cutlery, and/or writing: Reports  Review of Systems  Constitutional: Positive for fatigue. Negative for night sweats, weight gain and weight loss.  HENT: Negative for mouth sores, trouble swallowing, trouble swallowing, mouth dryness and nose dryness.   Eyes: Positive for dryness. Negative for pain, redness and visual disturbance.  Respiratory: Negative for cough, shortness of breath and difficulty breathing.   Cardiovascular: Negative for chest pain, palpitations, hypertension, irregular heartbeat and swelling in legs/feet.  Gastrointestinal: Negative for blood in stool, constipation and diarrhea.  Endocrine: Negative for excessive thirst and increased urination.  Genitourinary: Negative for painful urination and vaginal dryness.  Musculoskeletal: Positive for arthralgias, joint pain and morning stiffness. Negative for joint swelling, myalgias, muscle weakness, muscle tenderness and myalgias.  Skin: Negative for color change, rash, hair loss, skin tightness, ulcers and sensitivity to sunlight.  Allergic/Immunologic:  Negative for susceptible to infections.  Neurological: Negative for dizziness, memory loss, night sweats and weakness.  Hematological: Negative for bruising/bleeding tendency and swollen glands.  Psychiatric/Behavioral: Positive for depressed mood. Negative for sleep disturbance. The patient is not nervous/anxious.     PMFS History:  Patient Active Problem List   Diagnosis Date Noted  . Sarcoidosis 06/24/2018  . Primary osteoarthritis of both hands 06/24/2018  . Primary osteoarthritis of both knees 06/24/2018  . DDD (degenerative disc disease), lumbar 06/24/2018  . Fibromyalgia 06/24/2018  . Age-related osteoporosis without current pathological fracture 06/24/2018  . Essential hypertension 06/24/2018  . Dyslipidemia 06/24/2018  . History of coronary artery disease 06/24/2018  . History of hypothyroidism 06/24/2018    Past Medical History:  Diagnosis Date  . Bilateral artificial lens implant 2009  . Bronchitis 1994  . Cervical herniated disc 1986  . Chronic fatigue 2005  . 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 Coronary artery disease 2005  . Deviated septum 03/2011  . Fibromyalgia 2005  . Graves disease 2001  . Hepatitis C   . History of coronary angiogram 05/2012  . Hx of LASIK 2000  . Optic disc hemorrhage 2005  . Osteopenia 2005  . Pneumonia 1994  . Proptosis 2011  . Restless leg syndrome 2005  . Sarcoidosis 1980  . Spondylolisthesis   . Strabismus 2013    History reviewed. No pertinent family history. Past Surgical History:  Procedure Laterality Date  . ABDOMINAL HYSTERECTOMY  2000  . CARPAL TUNNEL RELEASE Right 2009  . CHOLECYSTECTOMY  1975  . cystic ectopic ovary  2000  . LAMINECTOMY  1986  . [place]  . orbital  decompression Bilateral 03/2011  . ossified ganglion surgery Right 1974  . SECONDARY INTRAOCULAR LENSE IMPLANTATION Right 05/2012  . TOTAL THYROIDECTOMY  2011   Social History   Social History Narrative  . Not on file     There is no immunization history on file for this patient.   Objective: Vital Signs: BP 136/73 (BP Location: Left Arm, Patient Position: Sitting, Cuff Size: Normal)   Pulse 75   Resp 16   Ht 5' 1.5" (1.562 m)   Wt 145 lb 12.8 oz (66.1 kg)   BMI 27.10 kg/m    Physical Exam Vitals signs and nursing note reviewed.  Constitutional:      Appearance: She is well-developed.  HENT:     Head: Normocephalic and atraumatic.  Eyes:     Conjunctiva/sclera: Conjunctivae normal.  Neck:     Musculoskeletal: Normal range of motion.  Cardiovascular:     Rate and Rhythm: Normal rate and regular rhythm.     Heart sounds: Normal heart sounds.  Pulmonary:     Effort: Pulmonary effort is normal.     Breath sounds: Normal breath sounds.  Abdominal:     General: Bowel sounds are normal.     Palpations: Abdomen is soft.  Lymphadenopathy:     Cervical: No cervical adenopathy.  Skin:    General: Skin is warm and dry.     Capillary Refill: Capillary refill takes less than 2 seconds.  Neurological:     Mental Status: She is alert and oriented to person, place, and time.  Psychiatric:        Behavior: Behavior normal.      Musculoskeletal Exam: C-spine thoracic spine was in good range of motion.  She has some discomfort range of motion of her lumbar spine.  Shoulder joints elbow joints wrist joints with good range of motion.  She has DIP and PIP thickening and some subluxation of DIPs due to underlying osteoarthritis.  No synovitis was noted.  Hip joints were in good range of motion.  She has crepitus in her knee joints without any warmth swelling or effusion.  She has some generalized hyperalgesia.  CDAI Exam: CDAI Score: - Patient Global: -; Provider Global: - Swollen: -; Tender: - Joint Exam   No joint exam has been documented for this visit   There is currently no information documented on the homunculus. Go to the Rheumatology activity and complete the homunculus joint exam.   Investigation: No additional findings.  Imaging: No results found.  Recent Labs: Lab Results  Component Value Date   WBC 10.4 05/30/2018   HGB 13.9 05/30/2018   PLT 267 05/30/2018   NA 130 (L) 05/30/2018   K 3.7 05/30/2018   CL 88 (L) 05/30/2018   CO2 31 05/30/2018   GLUCOSE 87 05/30/2018   BUN 14 05/30/2018   CREATININE 0.86 05/30/2018   BILITOT 0.9 05/30/2018   AST 22 05/30/2018   ALT 14 05/30/2018   PROT 7.4 05/30/2018   CALCIUM 10.0 05/30/2018   GFRAA 78 05/30/2018  UA negative, ANA 1: 80 homogeneous, CK 72, sed rate 9, RF negative, ACE negative  DXA 09/03/2018 The BMD measured at Femur Neck Left is 0.697 g/cm2 with a T-score of -2.4.  Speciality Comments: No specialty comments available.  Procedures:  No procedures performed Allergies: Prednisone   Assessment / Plan:     Visit Diagnoses: Sarcoidosis - Diagnosed in 1980 after positive cervical lymph node biopsy.ACE normal.  Her most recent chest x-ray was unremarkable.  She is currently not having any symptoms.  Patient states she was treated with Plaquenil in 1980s by rheumatologist.  Primary osteoarthritis of both hands -clinical and radiographic findings are consistent with osteoarthritis.  Plan: Joint protection muscle strengthening was discussed.  Use of diclofenac gel was discussed.  A handout on hand exercises was given.  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 -She continues to have pain and discomfort in her knee joints.  Plan: She may use diclofenac gel on her knees as well.  A handout on knee exercises was given.  DDD (degenerative disc disease), cervical - Plan: Exercises for cervical spine were demonstrated in the office.  DDD (degenerative disc disease), lumbar - Status post fusion x2 - Plan: A handout on back exercises was given.  Fibromyalgia -patient has longstanding history of fibromyalgia.  She has been doing regular exercises.  Good sleep hygiene was discussed.   Other fatigue -related to insomnia.  Good sleep hygiene was emphasized.  Age-related osteoporosis without current pathological fracture - Due to Graves' disease per patient.  History of treatment with Lyrica.  Reclast since 2012. Last DXA 08/2018 showed T score of minus 2.4. - Plan: Use of calcium and vitamin D was discussed.  She will need repeat bone density in 2 years.  Essential hypertension - Plan: Her blood pressure is well controlled.  Other medical problems are listed as follows:  Dyslipidemia   History of coronary artery disease   History of hypothyroidism  Orders: No orders of the defined types were placed in this encounter.  No orders of the defined types were placed in this encounter.   Face-to-face time spent with patient was 30 minutes. Greater than 50% of time was spent in counseling and coordination of care.  Follow-Up Instructions: Return in about 1 year (around 01/02/2020) for Sarcoidosis, Osteoarthritis.   Bo Merino, MD  Note - This record has been created using Editor, commissioning.  Chart creation errors have been sought, but may not always  have been located. Such creation errors do not reflect on  the standard of medical care.

## 2019-01-02 ENCOUNTER — Other Ambulatory Visit: Payer: Self-pay

## 2019-01-02 ENCOUNTER — Ambulatory Visit (INDEPENDENT_AMBULATORY_CARE_PROVIDER_SITE_OTHER): Payer: Medicare Other | Admitting: Rheumatology

## 2019-01-02 ENCOUNTER — Encounter: Payer: Self-pay | Admitting: Rheumatology

## 2019-01-02 VITALS — BP 136/73 | HR 75 | Resp 16 | Ht 61.5 in | Wt 145.8 lb

## 2019-01-02 DIAGNOSIS — M170 Bilateral primary osteoarthritis of knee: Secondary | ICD-10-CM

## 2019-01-02 DIAGNOSIS — M5030 Other cervical disc degeneration, unspecified cervical region: Secondary | ICD-10-CM

## 2019-01-02 DIAGNOSIS — Z8639 Personal history of other endocrine, nutritional and metabolic disease: Secondary | ICD-10-CM

## 2019-01-02 DIAGNOSIS — M797 Fibromyalgia: Secondary | ICD-10-CM

## 2019-01-02 DIAGNOSIS — M19041 Primary osteoarthritis, right hand: Secondary | ICD-10-CM

## 2019-01-02 DIAGNOSIS — Z8679 Personal history of other diseases of the circulatory system: Secondary | ICD-10-CM

## 2019-01-02 DIAGNOSIS — M810 Age-related osteoporosis without current pathological fracture: Secondary | ICD-10-CM

## 2019-01-02 DIAGNOSIS — D869 Sarcoidosis, unspecified: Secondary | ICD-10-CM | POA: Diagnosis not present

## 2019-01-02 DIAGNOSIS — R5383 Other fatigue: Secondary | ICD-10-CM

## 2019-01-02 DIAGNOSIS — M5136 Other intervertebral disc degeneration, lumbar region: Secondary | ICD-10-CM

## 2019-01-02 DIAGNOSIS — M19042 Primary osteoarthritis, left hand: Secondary | ICD-10-CM

## 2019-01-02 DIAGNOSIS — E785 Hyperlipidemia, unspecified: Secondary | ICD-10-CM

## 2019-01-02 DIAGNOSIS — I10 Essential (primary) hypertension: Secondary | ICD-10-CM

## 2019-01-02 NOTE — Patient Instructions (Signed)
Knee Exercises Ask your health care provider which exercises are safe for you. Do exercises exactly as told by your health care provider and adjust them as directed. It is normal to feel mild stretching, pulling, tightness, or discomfort as you do these exercises, but you should stop right away if you feel sudden pain or your pain gets worse.Do not begin these exercises until told by your health care provider. STRETCHING AND RANGE OF MOTION EXERCISES These exercises warm up your muscles and joints and improve the movement and flexibility of your knee. These exercises also help to relieve pain, numbness, and tingling. Exercise A: Knee Extension, Prone 1. Lie on your abdomen on a bed. 2. Place your left / right knee just beyond the edge of the surface so your knee is not on the bed. You can put a towel under your left / right thigh just above your knee for comfort. 3. Relax your leg muscles and allow gravity to straighten your knee. You should feel a stretch behind your left / right knee. 4. Hold this position for __________ seconds. 5. Scoot up so your knee is supported between repetitions. Repeat __________ times. Complete this stretch __________ times a day. Exercise B: Knee Flexion, Active  1. Lie on your back with both knees straight. If this causes back discomfort, bend your left / right knee so your foot is flat on the floor. 2. Slowly slide your left / right heel back toward your buttocks until you feel a gentle stretch in the front of your knee or thigh. 3. Hold this position for __________ seconds. 4. Slowly slide your left / right heel back to the starting position. Repeat __________ times. Complete this exercise __________ times a day. Exercise C: Quadriceps, Prone  1. Lie on your abdomen on a firm surface, such as a bed or padded floor. 2. Bend your left / right knee and hold your ankle. If you cannot reach your ankle or pant leg, loop a belt around your foot and grab the belt  instead. 3. Gently pull your heel toward your buttocks. Your knee should not slide out to the side. You should feel a stretch in the front of your thigh and knee. 4. Hold this position for __________ seconds. Repeat __________ times. Complete this stretch __________ times a day. Exercise D: Hamstring, Supine 1. Lie on your back. 2. Loop a belt or towel over the ball of your left / right foot. The ball of your foot is on the walking surface, right under your toes. 3. Straighten your left / right knee and slowly pull on the belt to raise your leg until you feel a gentle stretch behind your knee. ? Do not let your left / right knee bend while you do this. ? Keep your other leg flat on the floor. 4. Hold this position for __________ seconds. Repeat __________ times. Complete this stretch __________ times a day. STRENGTHENING EXERCISES These exercises build strength and endurance in your knee. Endurance is the ability to use your muscles for a long time, even after they get tired. Exercise E: Quadriceps, Isometric  1. Lie on your back with your left / right leg extended and your other knee bent. Put a rolled towel or small pillow under your knee if told by your health care provider. 2. Slowly tense the muscles in the front of your left / right thigh. You should see your kneecap slide up toward your hip or see increased dimpling just above the knee. This   motion will push the back of the knee toward the floor. 3. For __________ seconds, keep the muscle as tight as you can without increasing your pain. 4. Relax the muscles slowly and completely. Repeat __________ times. Complete this exercise __________ times a day. Exercise F: Straight Leg Raises - Quadriceps 1. Lie on your back with your left / right leg extended and your other knee bent. 2. Tense the muscles in the front of your left / right thigh. You should see your kneecap slide up or see increased dimpling just above the knee. Your thigh may  even shake a bit. 3. Keep these muscles tight as you raise your leg 4-6 inches (10-15 cm) off the floor. Do not let your knee bend. 4. Hold this position for __________ seconds. 5. Keep these muscles tense as you lower your leg. 6. Relax your muscles slowly and completely after each repetition. Repeat __________ times. Complete this exercise __________ times a day. Exercise G: Hamstring, Isometric 1. Lie on your back on a firm surface. 2. Bend your left / right knee approximately __________ degrees. 3. Dig your left / right heel into the surface as if you are trying to pull it toward your buttocks. Tighten the muscles in the back of your thighs to dig as hard as you can without increasing any pain. 4. Hold this position for __________ seconds. 5. Release the tension gradually and allow your muscles to relax completely for __________ seconds after each repetition. Repeat __________ times. Complete this exercise __________ times a day. Exercise H: Hamstring Curls  If told by your health care provider, do this exercise while wearing ankle weights. Begin with __________ weights. Then increase the weight by 1 lb (0.5 kg) increments. Do not wear ankle weights that are more than __________. 1. Lie on your abdomen with your legs straight. 2. Bend your left / right knee as far as you can without feeling pain. Keep your hips flat against the floor. 3. Hold this position for __________ seconds. 4. Slowly lower your leg to the starting position.  Repeat __________ times. Complete this exercise __________ times a day. Exercise I: Squats (Quadriceps) 1. Stand in front of a table, with your feet and knees pointing straight ahead. You may rest your hands on the table for balance but not for support. 2. Slowly bend your knees and lower your hips like you are going to sit in a chair. ? Keep your weight over your heels, not over your toes. ? Keep your lower legs upright so they are parallel with the table  legs. ? Do not let your hips go lower than your knees. ? Do not bend lower than told by your health care provider. ? If your knee pain increases, do not bend as low. 3. Hold the squat position for __________ seconds. 4. Slowly push with your legs to return to standing. Do not use your hands to pull yourself to standing. Repeat __________ times. Complete this exercise __________ times a day. Exercise J: Wall Slides (Quadriceps)  1. Lean your back against a smooth wall or door while you walk your feet out 18-24 inches (46-61 cm) from it. 2. Place your feet hip-width apart. 3. Slowly slide down the wall or door until your knees bend __________ degrees. Keep your knees over your heels, not over your toes. Keep your knees in line with your hips. 4. Hold for __________ seconds. Repeat __________ times. Complete this exercise __________ times a day. Exercise K: Straight Leg Raises -   Hip Abductors 1. Lie on your side with your left / right leg in the top position. Lie so your head, shoulder, knee, and hip line up. You may bend your bottom knee to help you keep your balance. 2. Roll your hips slightly forward so your hips are stacked directly over each other and your left / right knee is facing forward. 3. Leading with your heel, lift your top leg 4-6 inches (10-15 cm). You should feel the muscles in your outer hip lifting. ? Do not let your foot drift forward. ? Do not let your knee roll toward the ceiling. 4. Hold this position for __________ seconds. 5. Slowly return your leg to the starting position. 6. Let your muscles relax completely after each repetition. Repeat __________ times. Complete this exercise __________ times a day. Exercise L: Straight Leg Raises - Hip Extensors 1. Lie on your abdomen on a firm surface. You can put a pillow under your hips if that is more comfortable. 2. Tense the muscles in your buttocks and lift your left / right leg about 4-6 inches (10-15 cm). Keep your knee  straight as you lift your leg. 3. Hold this position for __________ seconds. 4. Slowly lower your leg to the starting position. 5. Let your leg relax completely after each repetition. Repeat __________ times. Complete this exercise __________ times a day. This information is not intended to replace advice given to you by your health care provider. Make sure you discuss any questions you have with your health care provider. Document Released: 05/24/2005 Document Revised: 04/03/2016 Document Reviewed: 05/16/2015 Elsevier Interactive Patient Education  2018 [employer] Hand Exercises Hand exercises can be helpful to almost anyone. These exercises can strengthen the hands, improve flexibility and movement, and increase blood flow to the hands. These results can make work and daily tasks easier. Hand exercises can be especially helpful for people who have joint pain from arthritis or have nerve damage from overuse (carpal tunnel syndrome). These exercises can also help people who have injured a hand. Most of these hand exercises are fairly gentle stretching routines. You can do them often throughout the day. Still, it is a good idea to ask your health care provider which exercises would be best for you. Warming your hands before exercise may help to reduce stiffness. You can do this with gentle massage or by placing your hands in warm water for 15 minutes. Also, make sure you pay attention to your level of hand pain as you begin an exercise routine. Exercises Knuckle bend Repeat this exercise 5-10 times with each hand. 1. Stand or sit with your arm, hand, and all five fingers pointed straight up. Make sure your wrist is straight. 2. Gently and slowly bend your fingers down and inward until the tips of your fingers are touching the tops of your palm. 3. Hold this position for a few seconds. 4. Extend your fingers out to their original position, all pointing straight up again. Finger fan Repeat this  exercise 5-10 times with each hand. 1. Hold your arm and hand out in front of you. Keep your wrist straight. 2. Squeeze your hand into a fist. 3. Hold this position for a few seconds. 4. Fan out, or spread apart, your hand and fingers as much as possible, stretching every joint fully. Tabletop Repeat this exercise 5-10 times with each hand. 1. Stand or sit with your arm, hand, and all five fingers pointed straight up. Make sure your wrist is straight. 2. Gently   and slowly bend your fingers at the knuckles where they meet the hand until your hand is making an upside-down L shape. Your fingers should form a tabletop. 3. Hold this position for a few seconds. 4. Extend your fingers out to their original position, all pointing straight up again. Making Os Repeat this exercise 5-10 times with each hand. 1. Stand or sit with your arm, hand, and all five fingers pointed straight up. Make sure your wrist is straight. 2. Make an O shape by touching your pointer finger to your thumb. Hold for a few seconds. Then open your hand wide. 3. Repeat this motion with each finger on your hand. Table spread Repeat this exercise 5-10 times with each hand. 1. Place your hand on a table with your palm facing down. Make sure your wrist is straight. 2. Spread your fingers out as much as possible. Hold this position for a few seconds. 3. Slide your fingers back together again. Hold for a few seconds. Ball grip Repeat this exercise 10-15 times with each hand. 1. Hold a tennis ball or another soft ball in your hand. 2. While slowly increasing pressure, squeeze the ball as hard as possible. 3. Squeeze as hard as you can for 3-5 seconds. 4. Relax and repeat.  Wrist curls Repeat this exercise 10-15 times with each hand. 1. Sit in a chair that has armrests. 2. Hold a light weight in your hand, such as a dumbbell that weighs 1-3 pounds (0.5-1.4 kg). Ask your health care provider what weight would be best for you. 3.  Rest your hand just over the end of the chair arm with your palm facing up. 4. Gently pivot your wrist up and down while holding the weight. Do not twist your wrist from side to side. Contact a health care provider if:  Your hand pain or discomfort gets much worse when you do an exercise.  Your hand pain or discomfort does not improve within 2 hours after you exercise. If you have any of these problems, stop doing these exercises right away. Do not do them again unless your health care provider says that you can. Get help right away if:  You develop sudden, severe hand pain. If this happens, stop doing these exercises right away. Do not do them again unless your health care provider says that you can. This information is not intended to replace advice given to you by your health care provider. Make sure you discuss any questions you have with your health care provider. Document Released: 06/21/2015 Document Revised: 11/13/2017 Document Reviewed: 01/18/2015 Elsevier Interactive Patient Education  2019 [employer] Back Exercises The following exercises strengthen the muscles that help to support the back. They also help to keep the lower back flexible. Doing these exercises can help to prevent back pain or lessen existing pain. If you have back pain or discomfort, try doing these exercises 2-3 times each day or as told by your health care provider. When the pain goes away, do them once each day, but increase the number of times that you repeat the steps for each exercise (do more repetitions). If you do not have back pain or discomfort, do these exercises once each day or as told by your health care provider. Exercises Single Knee to Chest Repeat these steps 3-5 times for each leg: 1. Lie on your back on a firm bed or the floor with your legs extended. 2. Bring one knee to your chest. Your other leg should stay extended  and in contact with the floor. 3. Hold your knee in place by grabbing your  knee or thigh. 4. Pull on your knee until you feel a gentle stretch in your lower back. 5. Hold the stretch for 10-30 seconds. 6. Slowly release and straighten your leg. Pelvic Tilt Repeat these steps 5-10 times: 1. Lie on your back on a firm bed or the floor with your legs extended. 2. Bend your knees so they are pointing toward the ceiling and your feet are flat on the floor. 3. Tighten your lower abdominal muscles to press your lower back against the floor. This motion will tilt your pelvis so your tailbone points up toward the ceiling instead of pointing to your feet or the floor. 4. With gentle tension and even breathing, hold this position for 5-10 seconds. Cat-Cow Repeat these steps until your lower back becomes more flexible: 1. Get into a hands-and-knees position on a firm surface. Keep your hands under your shoulders, and keep your knees under your hips. You may place padding under your knees for comfort. 2. Let your head hang down, and point your tailbone toward the floor so your lower back becomes rounded like the back of a cat. 3. Hold this position for 5 seconds. 4. Slowly lift your head and point your tailbone up toward the ceiling so your back forms a sagging arch like the back of a cow. 5. Hold this position for 5 seconds.  Press-Ups Repeat these steps 5-10 times: 1. Lie on your abdomen (face-down) on the floor. 2. Place your palms near your head, about shoulder-width apart. 3. While you keep your back as relaxed as possible and keep your hips on the floor, slowly straighten your arms to raise the top half of your body and lift your shoulders. Do not use your back muscles to raise your upper torso. You may adjust the placement of your hands to make yourself more comfortable. 4. Hold this position for 5 seconds while you keep your back relaxed. 5. Slowly return to lying flat on the floor.  Bridges Repeat these steps 10 times: 1. Lie on your back on a firm surface. 2. Bend  your knees so they are pointing toward the ceiling and your feet are flat on the floor. 3. Tighten your buttocks muscles and lift your buttocks off of the floor until your waist is at almost the same height as your knees. You should feel the muscles working in your buttocks and the back of your thighs. If you do not feel these muscles, slide your feet 1-2 inches farther away from your buttocks. 4. Hold this position for 3-5 seconds. 5. Slowly lower your hips to the starting position, and allow your buttocks muscles to relax completely. If this exercise is too easy, try doing it with your arms crossed over your chest. Abdominal Crunches Repeat these steps 5-10 times: 1. Lie on your back on a firm bed or the floor with your legs extended. 2. Bend your knees so they are pointing toward the ceiling and your feet are flat on the floor. 3. Cross your arms over your chest. 4. Tip your chin slightly toward your chest without bending your neck. 5. Tighten your abdominal muscles and slowly raise your trunk (torso) high enough to lift your shoulder blades a tiny bit off of the floor. Avoid raising your torso higher than that, because it can put too much stress on your low back and it does not help to strengthen your abdominal muscles.  6. Slowly return to your starting position. Back Lifts Repeat these steps 5-10 times: 1. Lie on your abdomen (face-down) with your arms at your sides, and rest your forehead on the floor. 2. Tighten the muscles in your legs and your buttocks. 3. Slowly lift your chest off of the floor while you keep your hips pressed to the floor. Keep the back of your head in line with the curve in your back. Your eyes should be looking at the floor. 4. Hold this position for 3-5 seconds. 5. Slowly return to your starting position. Contact a health care provider if:  Your back pain or discomfort gets much worse when you do an exercise.  Your back pain or discomfort does not lessen within  2 hours after you exercise. If you have any of these problems, stop doing these exercises right away. Do not do them again unless your health care provider says that you can. Get help right away if:  You develop sudden, severe back pain. If this happens, stop doing the exercises right away. Do not do them again unless your health care provider says that you can. This information is not intended to replace advice given to you by your health care provider. Make sure you discuss any questions you have with your health care provider. Document Released: 08/17/2004 Document Revised: 11/13/2017 Document Reviewed: 09/03/2014 Elsevier Interactive Patient Education  [employer].

## 2019-07-24 ENCOUNTER — Telehealth: Payer: Self-pay | Admitting: Rheumatology

## 2019-07-24 NOTE — Telephone Encounter (Signed)
Advised patient, per Dr. Estanislado Pandy she does recommend the vaccine and patient is okay to receive it due to the vaccine not being live. Advised patient she can contact the health department about registering. Patient verbalized understanding.

## 2019-07-24 NOTE — Telephone Encounter (Signed)
Patient left a voicemail requesting a return call regarding the COVID vaccine and information on how to register.

## 2019-07-25 HISTORY — PX: COLON SURGERY: SHX602

## 2019-07-29 ENCOUNTER — Encounter: Payer: Self-pay | Admitting: Rheumatology

## 2019-08-12 ENCOUNTER — Ambulatory Visit: Payer: Medicare Other | Attending: Internal Medicine

## 2019-08-12 DIAGNOSIS — Z23 Encounter for immunization: Secondary | ICD-10-CM

## 2019-08-12 NOTE — Progress Notes (Signed)
   Covid-19 Vaccination Clinic  Name:  Brooke Sanders    MRN: [id number] DOB: 1945-10-14  08/12/2019  Brooke Sanders was observed post Covid-19 immunization for 15 minutes without incidence. She was provided with Vaccine Information Sheet and instruction to access the V-Safe system.   Brooke Sanders was instructed to call 911 with any severe reactions post vaccine: Marland Kitchen Difficulty breathing  . Swelling of your face and throat  . A fast heartbeat  . A bad rash all over your body  . Dizziness and weakness    Immunizations Administered    Name Date Dose VIS Date Route   Pfizer COVID-19 Vaccine 08/12/2019  1:04 PM 0.3 mL 07/04/2019 Intramuscular   Manufacturer: [place]   Lot: [id number]   [place]: [id number]

## 2019-09-01 ENCOUNTER — Ambulatory Visit: Payer: Medicare Other | Attending: Internal Medicine

## 2019-09-01 DIAGNOSIS — Z23 Encounter for immunization: Secondary | ICD-10-CM | POA: Insufficient documentation

## 2019-09-01 NOTE — Progress Notes (Signed)
   Covid-19 Vaccination Clinic  Name:  Brooke Sanders    MRN: [id number] DOB: 02/03/1946  09/01/2019  Brooke Sanders was observed post Covid-19 immunization for 15 minutes without incidence. She was provided with Vaccine Information Sheet and instruction to access the V-Safe system.   Brooke Sanders was instructed to call 911 with any severe reactions post vaccine: Marland Kitchen Difficulty breathing  . Swelling of your face and throat  . A fast heartbeat  . A bad rash all over your body  . Dizziness and weakness    Immunizations Administered    Name Date Dose VIS Date Route   Pfizer COVID-19 Vaccine 09/01/2019  1:29 PM 0.3 mL 07/04/2019 Intramuscular   Manufacturer: [place]   Lot: [id number]   [place]: [id number]

## 2019-09-19 DIAGNOSIS — Z86010 Personal history of colonic polyps: Secondary | ICD-10-CM | POA: Insufficient documentation

## 2020-02-11 DIAGNOSIS — A630 Anogenital (venereal) warts: Secondary | ICD-10-CM | POA: Insufficient documentation

## 2020-04-12 ENCOUNTER — Other Ambulatory Visit (HOSPITAL_BASED_OUTPATIENT_CLINIC_OR_DEPARTMENT_OTHER): Payer: Self-pay | Admitting: Ophthalmology

## 2020-04-13 ENCOUNTER — Other Ambulatory Visit (HOSPITAL_BASED_OUTPATIENT_CLINIC_OR_DEPARTMENT_OTHER): Payer: Self-pay | Admitting: Ophthalmology

## 2020-04-13 DIAGNOSIS — E0500 Thyrotoxicosis with diffuse goiter without thyrotoxic crisis or storm: Secondary | ICD-10-CM

## 2020-04-23 NOTE — Progress Notes (Signed)
Office Visit Note  Patient: Brooke Sanders             Date of Birth: Jun 28, 1946           MRN: [id number]             PCP: Drosinis, Pamalee Leyden, PA-C Referring: Drosinis, Pamalee Leyden, PA-C Visit Date: 05/06/2020 Occupation: @GUAROCC @  Subjective:  Other (right knee pain- patient reports recent fall approximately 6 weeks ago. )   History of Present Illness: Brooke Sanders is a 74 y.o. female with history of sarcoidosis, osteoarthritis and fibromyalgia.  Her last chest x-ray 2 years ago was negative.  She denies any shortness of breath or any other symptoms.  She has flares of fibromyalgia off and on which causes discomfort.  Currently she is not having any discomfort.  She does have osteoarthritis in her hands and knees.  She had a fall about 6 weeks ago and she landed on her both knees.  She has been having increased discomfort in her right knee joint.  She states the pain has improved but has been persistent pain for the last 2 weeks.  She has noticed swelling in her right knee especially when she walks.  She denies any locking or giving out sensation.  She has been taking calcium and vitamin D for the history of osteoporosis.  She was recently seen by her ophthalmologist who referred her to a plastic surgeon for evaluation.    Activities of Daily Living:  Patient reports morning stiffness for 30  minutes.   Patient Reports nocturnal pain.  Difficulty dressing/grooming: Denies Difficulty climbing stairs: Denies Difficulty getting out of chair: Denies Difficulty using hands for taps, buttons, cutlery, and/or writing: Reports  Review of Systems  Constitutional: Positive for fatigue.  HENT: Positive for mouth dryness and nose dryness. Negative for mouth sores.   Eyes: Negative for pain, itching and dryness.  Respiratory: Negative for shortness of breath and difficulty breathing.   Cardiovascular: Negative for chest pain and palpitations.  Gastrointestinal: Negative for blood in  stool, constipation and diarrhea.  Endocrine: Negative for increased urination.  Genitourinary: Negative for difficulty urinating.  Musculoskeletal: Positive for arthralgias, joint pain, myalgias, morning stiffness, muscle tenderness and myalgias. Negative for joint swelling.  Skin: Negative for color change and rash.  Allergic/Immunologic: Negative for susceptible to infections.  Neurological: Positive for numbness. Negative for dizziness, headaches, memory loss and weakness.  Hematological: Positive for bruising/bleeding tendency.  Psychiatric/Behavioral: Positive for sleep disturbance. Negative for confusion.    PMFS History:  Patient Active Problem List   Diagnosis Date Noted  . Sarcoidosis 06/24/2018  . Primary osteoarthritis of both hands 06/24/2018  . Primary osteoarthritis of both knees 06/24/2018  . DDD (degenerative disc disease), lumbar 06/24/2018  . Fibromyalgia 06/24/2018  . Age-related osteoporosis without current pathological fracture 06/24/2018  . Essential hypertension 06/24/2018  . Dyslipidemia 06/24/2018  . History of coronary artery disease 06/24/2018  . History of hypothyroidism 06/24/2018    Past Medical History:  Diagnosis Date  . Bilateral artificial lens implant 2009  . Bronchitis 1994  . Cervical herniated disc 1986  . Chronic fatigue 2005  . 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 Coronary artery disease 2005  . Deviated septum 03/2011  . Fibromyalgia 2005  . Graves disease 2001  . Hepatitis C   . History of coronary angiogram 05/2012  . Hx of LASIK 2000  . Optic disc hemorrhage 2005  . Osteopenia 2005  . Pneumonia 1994  .  Proptosis 2011  . Restless leg syndrome 2005  . Sarcoidosis 1980  . Spondylolisthesis   . Strabismus 2013    History reviewed. No pertinent family history. Past Surgical History:  Procedure Laterality Date  . ABDOMINAL HYSTERECTOMY  2000  . CARPAL TUNNEL RELEASE Right 2009  . CHOLECYSTECTOMY  1975  . COLON SURGERY  2021   . COLONOSCOPY     x3 in 6 months. 2020-2021  . cystic ectopic ovary  2000  . LAMINECTOMY  1986  . [place]  . orbital decompression Bilateral 03/2011  . ossified ganglion surgery Right 1974  . SECONDARY INTRAOCULAR LENSE IMPLANTATION Right 05/2012  . TOTAL THYROIDECTOMY  2011   Social History   Social History Narrative  . Not on file   Immunization History  Administered Date(s) Administered  . PFIZER SARS-COV-2 Vaccination 08/12/2019, 09/01/2019, 04/02/2020     Objective: Vital Signs: BP (!) 152/74 (BP Location: Left Arm, Patient Position: Sitting, Cuff Size: Normal)   Pulse 75   Resp 14   Ht 5\' 2"  (1.575 m)   Wt 125 lb (56.7 kg)   BMI 22.86 kg/m    Physical Exam Vitals and nursing note reviewed.  Constitutional:      Appearance: She is well-developed.  HENT:     Head: Normocephalic and atraumatic.  Eyes:     Conjunctiva/sclera: Conjunctivae normal.  Cardiovascular:     Rate and Rhythm: Normal rate and regular rhythm.     Heart sounds: Normal heart sounds.  Pulmonary:     Effort: Pulmonary effort is normal.     Breath sounds: Normal breath sounds.  Abdominal:     General: Bowel sounds are normal.     Palpations: Abdomen is soft.  Musculoskeletal:     Cervical back: Normal range of motion.  Lymphadenopathy:     Cervical: No cervical adenopathy.  Skin:    General: Skin is warm and dry.     Capillary Refill: Capillary refill takes less than 2 seconds.  Neurological:     Mental Status: She is alert and oriented to person, place, and time.  Psychiatric:        Behavior: Behavior normal.      Musculoskeletal Exam: C-spine was in good range of motion.  She had limited range of motion of her lumbar spine with some discomfort.  Shoulder joints, elbow joints, wrist joints with good range of motion.  She has bilateral PIP and DIP thickening of her hands and feet consistent with osteoarthritis.  She had discomfort range of motion of her right knee  joint but no warmth swelling or effusion was noted.  CDAI Exam: CDAI Score: -- Patient Global: --; Provider Global: -- Swollen: --; Tender: -- Joint Exam 05/06/2020   No joint exam has been documented for this visit   There is currently no information documented on the homunculus. Go to the Rheumatology activity and complete the homunculus joint exam.  Investigation: No additional findings.  Imaging: No results found.  Recent Labs: Lab Results  Component Value Date   WBC 10.4 05/30/2018   HGB 13.9 05/30/2018   PLT 267 05/30/2018   NA 130 (L) 05/30/2018   K 3.7 05/30/2018   CL 88 (L) 05/30/2018   CO2 31 05/30/2018   GLUCOSE 87 05/30/2018   BUN 14 05/30/2018   CREATININE 0.86 05/30/2018   BILITOT 0.9 05/30/2018   AST 22 05/30/2018   ALT 14 05/30/2018   PROT 7.4 05/30/2018   CALCIUM 10.0 05/30/2018  GFRAA 78 05/30/2018    Speciality Comments: No specialty comments available.  Procedures:  No procedures performed Allergies: Prednisone   Assessment / Plan:     Visit Diagnoses: Sarcoidosis - Diagnosed in 1980 after positive cervical lymph node biopsy.ACE normal.  Patient states that she had lung involvement at one point.  She has been asymptomatic for many years.  Her most recent chest x-ray June 04, 2018  was unremarkable.  Primary osteoarthritis of both hands - clinical and radiographic findings are consistent with osteoarthritis.  Joint protection muscle strengthening was discussed.  Acute pain of right knee -she had a fall about 6 weeks ago.  She has been having persistent pain in her right knee joint.  No warmth swelling or effusion was noted.  Patient denies any history of instability.  Plan: XR KNEE 3 VIEW RIGHT.  X-ray showed moderate osteoarthritis and moderate chondromalacia patella.  On Ro calcinosis was noted.  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  Which is not symptomatic.  No treatment is needed at this point.  Close fracture of right patella,  initial encounter-patella fracture was noted on the x-ray.  X-ray findings were reviewed with the patient.  I also discussed x-rays findings with Dr. Durward Fortes.  He recommended an immobilizer brace.  A prescription for the brace was given.  She will use it for the next 2 weeks as needed.  Have advised her to schedule an appointment with Dr. Durward Fortes in case she has persistent symptoms.  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  DDD (degenerative disc disease), lumbar - Status post fusion x2.  She has some limitation and discomfort but doing fairly well.  Fibromyalgia-she continues to have some episodic discomfort from fibromyalgia  Other fatigue-related to fibromyalgia.  Age-related osteoporosis without current pathological fracture - Due to Graves' disease per patient.  History of treatment with Lyrica.  Reclast since 2012x 3. Last DXA 08/2018 showed T score of minus 2.4.  I will schedule DEXA for February 2022.  Other medical problems are listed as follows:  Dyslipidemia  History of coronary artery disease  Essential hypertension-her systolic blood pressure is elevated.  Have advised her to monitor blood pressure closely.  History of hypothyroidism  Educated about COVID-19 virus infection-she is fully vaccinated against COVID-19.  Use of mask, social distancing and hand hygiene was discussed.  Orders: Orders Placed This Encounter  Procedures  . XR KNEE 3 VIEW RIGHT   No orders of the defined types were placed in this encounter.    Follow-Up Instructions: Return in about 6 months (around 11/04/2020) for Osteoarthritis.   Bo Merino, MD  Note - This record has been created using Editor, commissioning.  Chart creation errors have been sought, but may not always  have been located. Such creation errors do not reflect on  the standard of medical care.

## 2020-05-06 ENCOUNTER — Ambulatory Visit: Payer: Self-pay

## 2020-05-06 ENCOUNTER — Encounter: Payer: Self-pay | Admitting: Rheumatology

## 2020-05-06 ENCOUNTER — Ambulatory Visit (INDEPENDENT_AMBULATORY_CARE_PROVIDER_SITE_OTHER): Payer: Medicare Other | Admitting: Rheumatology

## 2020-05-06 ENCOUNTER — Other Ambulatory Visit: Payer: Self-pay

## 2020-05-06 VITALS — BP 152/74 | HR 75 | Resp 14 | Ht 62.0 in | Wt 125.0 lb

## 2020-05-06 DIAGNOSIS — Z8639 Personal history of other endocrine, nutritional and metabolic disease: Secondary | ICD-10-CM

## 2020-05-06 DIAGNOSIS — M797 Fibromyalgia: Secondary | ICD-10-CM

## 2020-05-06 DIAGNOSIS — I10 Essential (primary) hypertension: Secondary | ICD-10-CM

## 2020-05-06 DIAGNOSIS — D869 Sarcoidosis, unspecified: Secondary | ICD-10-CM | POA: Diagnosis not present

## 2020-05-06 DIAGNOSIS — M25561 Pain in right knee: Secondary | ICD-10-CM

## 2020-05-06 DIAGNOSIS — S82091A Other fracture of right patella, initial encounter for closed fracture: Secondary | ICD-10-CM

## 2020-05-06 DIAGNOSIS — Z7189 Other specified counseling: Secondary | ICD-10-CM

## 2020-05-06 DIAGNOSIS — M51369 Other intervertebral disc degeneration, lumbar region without mention of lumbar back pain or lower extremity pain: Secondary | ICD-10-CM

## 2020-05-06 DIAGNOSIS — E785 Hyperlipidemia, unspecified: Secondary | ICD-10-CM

## 2020-05-06 DIAGNOSIS — M170 Bilateral primary osteoarthritis of knee: Secondary | ICD-10-CM | POA: Diagnosis not present

## 2020-05-06 DIAGNOSIS — M5030 Other cervical disc degeneration, unspecified cervical region: Secondary | ICD-10-CM

## 2020-05-06 DIAGNOSIS — M810 Age-related osteoporosis without current pathological fracture: Secondary | ICD-10-CM

## 2020-05-06 DIAGNOSIS — M19042 Primary osteoarthritis, left hand: Secondary | ICD-10-CM

## 2020-05-06 DIAGNOSIS — M5136 Other intervertebral disc degeneration, lumbar region: Secondary | ICD-10-CM

## 2020-05-06 DIAGNOSIS — M1120 Other chondrocalcinosis, unspecified site: Secondary | ICD-10-CM

## 2020-05-06 DIAGNOSIS — M19041 Primary osteoarthritis, right hand: Secondary | ICD-10-CM | POA: Diagnosis not present

## 2020-05-06 DIAGNOSIS — Z8679 Personal history of other diseases of the circulatory system: Secondary | ICD-10-CM

## 2020-05-06 DIAGNOSIS — R5383 Other fatigue: Secondary | ICD-10-CM

## 2020-05-13 ENCOUNTER — Other Ambulatory Visit: Payer: Self-pay

## 2020-05-13 ENCOUNTER — Ambulatory Visit (HOSPITAL_BASED_OUTPATIENT_CLINIC_OR_DEPARTMENT_OTHER)
Admission: RE | Admit: 2020-05-13 | Discharge: 2020-05-13 | Disposition: A | Discharge status: Home / Self Care | Payer: Medicare Other | Source: Ambulatory Visit | Attending: Ophthalmology | Admitting: Ophthalmology

## 2020-05-13 ENCOUNTER — Encounter (HOSPITAL_BASED_OUTPATIENT_CLINIC_OR_DEPARTMENT_OTHER): Payer: Self-pay

## 2020-05-13 DIAGNOSIS — E0500 Thyrotoxicosis with diffuse goiter without thyrotoxic crisis or storm: Secondary | ICD-10-CM | POA: Diagnosis present

## 2020-05-13 MED ORDER — IOHEXOL 300 MG/ML  SOLN
100.0000 mL | Freq: Once | INTRAMUSCULAR | Status: AC | PRN
  Administered 2020-05-13: 80 mL via INTRAVENOUS

## 2020-06-03 ENCOUNTER — Telehealth: Payer: Self-pay | Admitting: *Deleted

## 2020-06-03 NOTE — Telephone Encounter (Signed)
Labs received from Dr. Colman Cater Drawn on 05/12/2020 Reviewed by Hazel Sams, PA-C  Glucose 134 Sodium 133 Chloride 91  We do note have patient on any medication.

## 2020-09-20 ENCOUNTER — Other Ambulatory Visit: Payer: Self-pay

## 2020-09-20 ENCOUNTER — Ambulatory Visit (HOSPITAL_BASED_OUTPATIENT_CLINIC_OR_DEPARTMENT_OTHER)
Admission: RE | Admit: 2020-09-20 | Discharge: 2020-09-20 | Disposition: A | Discharge status: Home / Self Care | Payer: Medicare Other | Source: Ambulatory Visit | Attending: Rheumatology | Admitting: Rheumatology

## 2020-09-20 DIAGNOSIS — M810 Age-related osteoporosis without current pathological fracture: Secondary | ICD-10-CM

## 2020-09-20 NOTE — Progress Notes (Signed)
DEXA findings are consistent with osteopenia.  We will discuss results at the follow-up visit.

## 2020-10-29 NOTE — Progress Notes (Signed)
Office Visit Note  Patient: Brooke Sanders             Date of Birth: 1945/10/19           MRN: [id number]             PCP: Drosinis, Pamalee Leyden, PA-C Referring: Drosinis, Pamalee Leyden, PA-C Visit Date: 11/11/2020 Occupation: @GUAROCC @  Subjective:  Pain in multiple joints.   History of Present Illness: Brooke Sanders is a 75 y.o. female with a history of osteoarthritis, degenerative disc disease, chondrocalcinosis, sarcoidosis, fibromyalgia and osteoporosis.  She continues to have some discomfort in her bilateral hands and bilateral knee joints.  She is some stiffness in her cervical spine.  The lower back pain is doing well.  She has flares of osteoarthritis and fibromyalgia with the weather change.  She has not had a flare of sarcoidosis in many years.  She denies any shortness of breath.  Has been taking calcium and vitamin D and has been exercising on a regular basis.  Activities of Daily Living:  Patient reports morning stiffness for less than 5  minutes.   Patient Reports nocturnal pain.  Difficulty dressing/grooming: Denies Difficulty climbing stairs: Denies Difficulty getting out of chair: Denies Difficulty using hands for taps, buttons, cutlery, and/or writing: Reports  Review of Systems  Constitutional: Negative for fatigue, night sweats, weight gain and weight loss.  HENT: Positive for mouth dryness and nose dryness. Negative for mouth sores, trouble swallowing and trouble swallowing.   Eyes: Positive for dryness. Negative for pain, redness, itching and visual disturbance.  Respiratory: Negative for cough, shortness of breath and difficulty breathing.   Cardiovascular: Negative for chest pain, palpitations, hypertension, irregular heartbeat and swelling in legs/feet.  Gastrointestinal: Negative for blood in stool, constipation and diarrhea.  Endocrine: Negative for increased urination.  Genitourinary: Negative for difficulty urinating and vaginal dryness.   Musculoskeletal: Positive for arthralgias, joint pain and morning stiffness. Negative for joint swelling, myalgias, muscle weakness, muscle tenderness and myalgias.  Skin: Negative for color change, rash, hair loss, redness, skin tightness, ulcers and sensitivity to sunlight.  Allergic/Immunologic: Negative for susceptible to infections.  Neurological: Positive for headaches. Negative for dizziness, numbness, memory loss, night sweats and weakness.  Hematological: Negative for bruising/bleeding tendency and swollen glands.  Psychiatric/Behavioral: Negative for depressed mood, confusion and sleep disturbance. The patient is not nervous/anxious.     PMFS History:  Patient Active Problem List   Diagnosis Date Noted  . DDD (degenerative disc disease), cervical 11/11/2020  . Chondrocalcinosis 11/11/2020  . Sarcoidosis 06/24/2018  . Primary osteoarthritis of both hands 06/24/2018  . Primary osteoarthritis of both knees 06/24/2018  . DDD (degenerative disc disease), lumbar 06/24/2018  . Fibromyalgia 06/24/2018  . Age-related osteoporosis without current pathological fracture 06/24/2018  . Essential hypertension 06/24/2018  . Dyslipidemia 06/24/2018  . History of coronary artery disease 06/24/2018  . History of hypothyroidism 06/24/2018    Past Medical History:  Diagnosis Date  . Bilateral artificial lens implant 2009  . Bronchitis 1994  . Cervical herniated disc 1986  . Chronic fatigue 2005  . 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 Coronary artery disease 2005  . Deviated septum 03/2011  . Fibromyalgia 2005  . Graves disease 2001  . Hepatitis C   . History of coronary angiogram 05/2012  . Hx of LASIK 2000  . Optic disc hemorrhage 2005  . Osteopenia 2005  . Pneumonia 1994  . Proptosis 2011  . Restless leg syndrome 2005  .  Sarcoidosis 1980  . Spondylolisthesis   . Strabismus 2013    History reviewed. No pertinent family history. Past Surgical History:  Procedure Laterality  Date  . ABDOMINAL HYSTERECTOMY  2000  . CARPAL TUNNEL RELEASE Right 2009  . CHOLECYSTECTOMY  1975  . COLON SURGERY  2021  . COLONOSCOPY     x3 in 6 months. 2020-2021  . cystic ectopic ovary  2000  . LAMINECTOMY  1986  . [place]  . orbital decompression Bilateral 03/2011  . ossified ganglion surgery Right 1974  . SECONDARY INTRAOCULAR LENSE IMPLANTATION Right 05/2012  . TOTAL THYROIDECTOMY  2011   Social History   Social History Narrative  . Not on file   Immunization History  Administered Date(s) Administered  . PFIZER(Purple Top)SARS-COV-2 Vaccination 08/12/2019, 09/01/2019, 04/02/2020, 10/22/2020     Objective: Vital Signs: BP 138/84 (BP Location: Left Arm, Patient Position: Sitting, Cuff Size: Normal)   Pulse 60   Resp 14   Ht 5\' 2"  (1.575 m)   Wt 126 lb 12.8 oz (57.5 kg)   BMI 23.19 kg/m    Physical Exam Vitals and nursing note reviewed.  Constitutional:      Appearance: She is well-developed.  HENT:     Head: Normocephalic and atraumatic.  Eyes:     Conjunctiva/sclera: Conjunctivae normal.  Cardiovascular:     Rate and Rhythm: Normal rate and regular rhythm.     Heart sounds: Normal heart sounds.  Pulmonary:     Effort: Pulmonary effort is normal.     Breath sounds: Normal breath sounds.  Abdominal:     General: Bowel sounds are normal.     Palpations: Abdomen is soft.  Musculoskeletal:     Cervical back: Normal range of motion.  Lymphadenopathy:     Cervical: No cervical adenopathy.  Skin:    General: Skin is warm and dry.     Capillary Refill: Capillary refill takes less than 2 seconds.  Neurological:     Mental Status: She is alert and oriented to person, place, and time.  Psychiatric:        Behavior: Behavior normal.      Musculoskeletal Exam: She has limited range of motion of her cervical and lumbar spine .  She has some thoracic kyphosis.  Shoulder joints, elbow joints, wrist joints with good range of motion.  She has  bilateral PIP and DIP thickening.  Hip joints, knee joints, ankles with good range of motion.  She had no tenderness over MTPs.  CDAI Exam: CDAI Score: -- Patient Global: --; Provider Global: -- Swollen: --; Tender: -- Joint Exam 11/11/2020   No joint exam has been documented for this visit   There is currently no information documented on the homunculus. Go to the Rheumatology activity and complete the homunculus joint exam.  Investigation: No additional findings.  Imaging: No results found.  Recent Labs: Lab Results  Component Value Date   WBC 10.4 05/30/2018   HGB 13.9 05/30/2018   PLT 267 05/30/2018   NA 130 (L) 05/30/2018   K 3.7 05/30/2018   CL 88 (L) 05/30/2018   CO2 31 05/30/2018   GLUCOSE 87 05/30/2018   BUN 14 05/30/2018   CREATININE 0.86 05/30/2018   BILITOT 0.9 05/30/2018   AST 22 05/30/2018   ALT 14 05/30/2018   PROT 7.4 05/30/2018   CALCIUM 10.0 05/30/2018   GFRAA 78 05/30/2018    Speciality Comments: No specialty comments available.  Procedures:  No procedures performed Allergies:  Prednisone   Assessment / Plan:     Visit Diagnoses: Sarcoidosis - Diagnosed in 1980 after positive cervical lymph node biopsy.ACE normal.  She is asymptomatic.  She has no lymphadenopathy and no shortness of breath.  Primary osteoarthritis of both hands - clinical and radiographic findings are consistent with osteoarthritis.  Joint protection muscle strengthening was discussed.  I have given her a handout on hand muscle strengthening exercises.  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  She has off-and-on discomfort in her knee joints.  Lower extremity muscle exercises were discussed and handout was given.  Chondrocalcinosis-she has not had any flares of chondrocalcinosis.  DDD (degenerative disc disease), cervical-she has limited range of motion and off-and-on discomfort.  DDD (degenerative disc disease), lumbar - Status post fusion x2.  She  is not having any lower back pain.  Fibromyalgia-she has fibromyalgia flares with the weather change.  Other fatigue -she has ongoing fatigue.  Plan: TSH  Age-related osteoporosis without current pathological fracture - Reclast since 2012x 3. DEXA 09/20/20 The BMD measured at Femur Neck Left is 0.699 g/cm2 with a T-score of -2.4.  She has been off Reclast since 2015. -I discussed the bone density results.  She would like to restart on Reclast.  She has been seeing a dentist on a regular basis.  She states she would not need any dental work in the near future.  Use of calcium rich diet and vitamin D was discussed.  We will also schedule her Reclast infusion after the lab work is available.  A handout on Reclast was given and the side effects were discussed.  Plan: VITAMIN D 25 Hydroxy (Vit-D Deficiency, Fractures), Parathyroid hormone, intact (no Ca), Phosphorus, Serum protein electrophoresis with reflex  Dyslipidemia-she will see her cardiologist.  History of coronary artery disease-need for regular exercise and dietary management was discussed.  Essential hypertension-her blood pressure is well controlled.  History of hypothyroidism  Medication monitoring encounter - Plan: CBC with Differential/Platelet, COMPLETE METABOLIC PANEL WITH GFR  Orders: Orders Placed This Encounter  Procedures  . CBC with Differential/Platelet  . COMPLETE METABOLIC PANEL WITH GFR  . VITAMIN D 25 Hydroxy (Vit-D Deficiency, Fractures)  . TSH  . Parathyroid hormone, intact (no Ca)  . Phosphorus  . Serum protein electrophoresis with reflex   No orders of the defined types were placed in this encounter.     Follow-Up Instructions: Return in about 6 months (around 05/13/2021) for Osteoarthritis, Osteoporosis.   Bo Merino, MD  Note - This record has been created using Editor, commissioning.  Chart creation errors have been sought, but may not always  have been located. Such creation errors do not reflect  on  the standard of medical care.

## 2020-11-11 ENCOUNTER — Other Ambulatory Visit: Payer: Self-pay

## 2020-11-11 ENCOUNTER — Ambulatory Visit (INDEPENDENT_AMBULATORY_CARE_PROVIDER_SITE_OTHER): Payer: Medicare Other | Admitting: Rheumatology

## 2020-11-11 ENCOUNTER — Telehealth: Payer: Self-pay

## 2020-11-11 ENCOUNTER — Encounter: Payer: Self-pay | Admitting: Rheumatology

## 2020-11-11 VITALS — BP 138/84 | HR 60 | Resp 14 | Ht 62.0 in | Wt 126.8 lb

## 2020-11-11 DIAGNOSIS — E785 Hyperlipidemia, unspecified: Secondary | ICD-10-CM

## 2020-11-11 DIAGNOSIS — R5383 Other fatigue: Secondary | ICD-10-CM

## 2020-11-11 DIAGNOSIS — M1120 Other chondrocalcinosis, unspecified site: Secondary | ICD-10-CM

## 2020-11-11 DIAGNOSIS — M19041 Primary osteoarthritis, right hand: Secondary | ICD-10-CM | POA: Diagnosis not present

## 2020-11-11 DIAGNOSIS — M797 Fibromyalgia: Secondary | ICD-10-CM

## 2020-11-11 DIAGNOSIS — D869 Sarcoidosis, unspecified: Secondary | ICD-10-CM

## 2020-11-11 DIAGNOSIS — M5030 Other cervical disc degeneration, unspecified cervical region: Secondary | ICD-10-CM

## 2020-11-11 DIAGNOSIS — M170 Bilateral primary osteoarthritis of knee: Secondary | ICD-10-CM

## 2020-11-11 DIAGNOSIS — M25561 Pain in right knee: Secondary | ICD-10-CM

## 2020-11-11 DIAGNOSIS — Z8679 Personal history of other diseases of the circulatory system: Secondary | ICD-10-CM

## 2020-11-11 DIAGNOSIS — Z8639 Personal history of other endocrine, nutritional and metabolic disease: Secondary | ICD-10-CM

## 2020-11-11 DIAGNOSIS — M810 Age-related osteoporosis without current pathological fracture: Secondary | ICD-10-CM

## 2020-11-11 DIAGNOSIS — M19042 Primary osteoarthritis, left hand: Secondary | ICD-10-CM

## 2020-11-11 DIAGNOSIS — I10 Essential (primary) hypertension: Secondary | ICD-10-CM

## 2020-11-11 DIAGNOSIS — M5136 Other intervertebral disc degeneration, lumbar region: Secondary | ICD-10-CM

## 2020-11-11 DIAGNOSIS — Z5181 Encounter for therapeutic drug level monitoring: Secondary | ICD-10-CM

## 2020-11-11 DIAGNOSIS — S82091A Other fracture of right patella, initial encounter for closed fracture: Secondary | ICD-10-CM

## 2020-11-11 NOTE — Patient Instructions (Addendum)
Zoledronic Acid Injection (Paget's Disease, Osteoporosis) What is this medicine? ZOLEDRONIC ACID (ZOE le dron ik AS id) slows calcium loss from bones. It treats Paget's disease and osteoporosis. It may be used in other people at risk for bone loss. This medicine may be used for other purposes; ask your health care provider or pharmacist if you have questions. COMMON BRAND NAME(S): Reclast, Zometa What should I tell my health care provider before I take this medicine? They need to know if you have any of these conditions:  bleeding disorder  cancer  dental disease  kidney disease  low levels of calcium in the blood  low red blood cell counts  lung or breathing disease (asthma)  receiving steroids like dexamethasone or prednisone  an unusual or allergic reaction to zoledronic acid, other medicines, foods, dyes, or preservatives  pregnant or trying to get pregnant  breast-feeding How should I use this medicine? This drug is injected into a vein. It is given by a health care provider in a hospital or clinic setting. A special MedGuide will be given to you before each treatment. Be sure to read this information carefully each time. Talk to your health care provider about the use of this drug in children. Special care may be needed. Overdosage: If you think you have taken too much of this medicine contact a poison control center or emergency room at once. NOTE: This medicine is only for you. Do not share this medicine with others. What if I miss a dose? Keep appointments for follow-up doses. It is important not to miss your dose. Call your health care provider if you are unable to keep an appointment. What may interact with this medicine?  certain antibiotics given by injection  NSAIDs, medicines for pain and inflammation, like ibuprofen or naproxen  some diuretics like bumetanide, furosemide  teriparatide This list may not describe all possible interactions. Give your  health care provider a list of all the medicines, herbs, non-prescription drugs, or dietary supplements you use. Also tell them if you smoke, drink alcohol, or use illegal drugs. Some items may interact with your medicine. What should I watch for while using this medicine? Visit your health care provider for regular checks on your progress. It may be some time before you see the benefit from this drug. Some people who take this drug have severe bone, joint, or muscle pain. This drug may also increase your risk for jaw problems or a broken thigh bone. Tell your health care provider right away if you have severe pain in your jaw, bones, joints, or muscles. Tell you health care provider if you have any pain that does not go away or that gets worse. You should make sure you get enough calcium and vitamin D while you are taking this drug. Discuss the foods you eat and the vitamins you take with your health care provider. You may need blood work done while you are taking this drug. Tell your dentist and dental surgeon that you are taking this drug. You should not have major dental surgery while on this drug. See your dentist to have a dental exam and fix any dental problems before starting this drug. Take good care of your teeth while on this drug. Make sure you see your dentist for regular follow-up appointments. What side effects may I notice from receiving this medicine? Side effects that you should report to your doctor or health care provider as soon as possible:  allergic reactions (skin rash, itching or  hives; swelling of the face, lips, or tongue)  bone pain  infection (fever, chills, cough, sore throat, pain or trouble passing urine)  jaw pain, especially after dental work  joint pain  kidney injury (trouble passing urine or change in the amount of urine)  low calcium levels (fast heartbeat; muscle cramps or pain; pain, tingling, or numbness in the hands or feet; seizures)  low red blood  cell counts (trouble breathing; feeling faint; lightheaded, falls; unusually weak or tired)  muscle pain  palpitations  redness, blistering, peeling, or loosening of the skin, including inside the mouth Side effects that usually do not require medical attention (report to your doctor or health care provider if they continue or are bothersome):  diarrhea  eye irritation, itching, or pain  fever  general ill feeling or flu-like symptoms  headache  increase in blood pressure  nausea  pain, redness, or irritation at site where injected  stomach pain  upset stomach This list may not describe all possible side effects. Call your doctor for medical advice about side effects. You may report side effects to FDA at 1-800-FDA-1088. Where should I keep my medicine? This drug is given in a hospital or clinic. It will not be stored at home. NOTE: This sheet is a summary. It may not cover all possible information. If you have questions about this medicine, talk to your doctor, pharmacist, or health care provider.  2021 Elsevier/Gold Standard (2019-04-28 11:46:18)     Hand Exercises Hand exercises can be helpful for almost anyone. These exercises can strengthen the hands, improve flexibility and movement, and increase blood flow to the hands. These results can make work and daily tasks easier. Hand exercises can be especially helpful for people who have joint pain from arthritis or have nerve damage from overuse (carpal tunnel syndrome). These exercises can also help people who have injured a hand. Exercises Most of these hand exercises are gentle stretching and motion exercises. It is usually safe to do them often throughout the day. Warming up your hands before exercise may help to reduce stiffness. You can do this with gentle massage or by placing your hands in warm water for 10-15 minutes. It is normal to feel some stretching, pulling, tightness, or mild discomfort as you begin new  exercises. This will gradually improve. Stop an exercise right away if you feel sudden, severe pain or your pain gets worse. Ask your health care provider which exercises are best for you. Knuckle bend or "claw" fist 1. Stand or sit with your arm, hand, and all five fingers pointed straight up. Make sure to keep your wrist straight during the exercise. 2. Gently bend your fingers down toward your palm until the tips of your fingers are touching the top of your palm. Keep your big knuckle straight and just bend the small knuckles in your fingers. 3. Hold this position for __________ seconds. 4. Straighten (extend) your fingers back to the starting position. Repeat this exercise 5-10 times with each hand. Full finger fist 1. Stand or sit with your arm, hand, and all five fingers pointed straight up. Make sure to keep your wrist straight during the exercise. 2. Gently bend your fingers into your palm until the tips of your fingers are touching the middle of your palm. 3. Hold this position for __________ seconds. 4. Extend your fingers back to the starting position, stretching every joint fully. Repeat this exercise 5-10 times with each hand. Straight fist 1. Stand or sit with your  arm, hand, and all five fingers pointed straight up. Make sure to keep your wrist straight during the exercise. 2. Gently bend your fingers at the big knuckle, where your fingers meet your hand, and the middle knuckle. Keep the knuckle at the tips of your fingers straight and try to touch the bottom of your palm. 3. Hold this position for __________ seconds. 4. Extend your fingers back to the starting position, stretching every joint fully. Repeat this exercise 5-10 times with each hand. Tabletop 1. Stand or sit with your arm, hand, and all five fingers pointed straight up. Make sure to keep your wrist straight during the exercise. 2. Gently bend your fingers at the big knuckle, where your fingers meet your hand, as far  down as you can while keeping the small knuckles in your fingers straight. Think of forming a tabletop with your fingers. 3. Hold this position for __________ seconds. 4. Extend your fingers back to the starting position, stretching every joint fully. Repeat this exercise 5-10 times with each hand. Finger spread 1. Place your hand flat on a table with your palm facing down. Make sure your wrist stays straight as you do this exercise. 2. Spread your fingers and thumb apart from each other as far as you can until you feel a gentle stretch. Hold this position for __________ seconds. 3. Bring your fingers and thumb tight together again. Hold this position for __________ seconds. Repeat this exercise 5-10 times with each hand. Making circles 1. Stand or sit with your arm, hand, and all five fingers pointed straight up. Make sure to keep your wrist straight during the exercise. 2. Make a circle by touching the tip of your thumb to the tip of your index finger. 3. Hold for __________ seconds. Then open your hand wide. 4. Repeat this motion with your thumb and each finger on your hand. Repeat this exercise 5-10 times with each hand. Thumb motion 1. Sit with your forearm resting on a table and your wrist straight. Your thumb should be facing up toward the ceiling. Keep your fingers relaxed as you move your thumb. 2. Lift your thumb up as high as you can toward the ceiling. Hold for __________ seconds. 3. Bend your thumb across your palm as far as you can, reaching the tip of your thumb for the small finger (pinkie) side of your palm. Hold for __________ seconds. Repeat this exercise 5-10 times with each hand. Grip strengthening 1. Hold a stress ball or other soft ball in the middle of your hand. 2. Slowly increase the pressure, squeezing the ball as much as you can without causing pain. Think of bringing the tips of your fingers into the middle of your palm. All of your finger joints should bend when  doing this exercise. 3. Hold your squeeze for __________ seconds, then relax. Repeat this exercise 5-10 times with each hand.   Contact a health care provider if:  Your hand pain or discomfort gets much worse when you do an exercise.  Your hand pain or discomfort does not improve within 2 hours after you exercise. If you have any of these problems, stop doing these exercises right away. Do not do them again unless your health care provider says that you can. Get help right away if:  You develop sudden, severe hand pain or swelling. If this happens, stop doing these exercises right away. Do not do them again unless your health care provider says that you can. This information is not  intended to replace advice given to you by your health care provider. Make sure you discuss any questions you have with your health care provider. Document Revised: 10/31/2018 Document Reviewed: 07/11/2018 Elsevier Patient Education  2021 [place] for Nurse Practitioners, 15(4), [phone]. Retrieved April 29, 2018 from http://clinicalkey.com/nursing">  Knee Exercises Ask your health care provider which exercises are safe for you. Do exercises exactly as told by your health care provider and adjust them as directed. It is normal to feel mild stretching, pulling, tightness, or discomfort as you do these exercises. Stop right away if you feel sudden pain or your pain gets worse. Do not begin these exercises until told by your health care provider. Stretching and range-of-motion exercises These exercises warm up your muscles and joints and improve the movement and flexibility of your knee. These exercises also help to relieve pain and swelling. Knee extension, prone 1. Lie on your abdomen (prone position) on a bed. 2. Place your left / right knee just beyond the edge of the surface so your knee is not on the bed. You can put a towel under your left / right thigh just above your kneecap for  comfort. 3. Relax your leg muscles and allow gravity to straighten your knee (extension). You should feel a stretch behind your left / right knee. 4. Hold this position for __________ seconds. 5. Scoot up so your knee is supported between repetitions. Repeat __________ times. Complete this exercise __________ times a day. Knee flexion, active 1. Lie on your back with both legs straight. If this causes back discomfort, bend your left / right knee so your foot is flat on the floor. 2. Slowly slide your left / right heel back toward your buttocks. Stop when you feel a gentle stretch in the front of your knee or thigh (flexion). 3. Hold this position for __________ seconds. 4. Slowly slide your left / right heel back to the starting position. Repeat __________ times. Complete this exercise __________ times a day.   Quadriceps stretch, prone 1. Lie on your abdomen on a firm surface, such as a bed or padded floor. 2. Bend your left / right knee and hold your ankle. If you cannot reach your ankle or pant leg, loop a belt around your foot and grab the belt instead. 3. Gently pull your heel toward your buttocks. Your knee should not slide out to the side. You should feel a stretch in the front of your thigh and knee (quadriceps). 4. Hold this position for __________ seconds. Repeat __________ times. Complete this exercise __________ times a day.   Hamstring, supine 1. Lie on your back (supine position). 2. Loop a belt or towel over the ball of your left / right foot. The ball of your foot is on the walking surface, right under your toes. 3. Straighten your left / right knee and slowly pull on the belt to raise your leg until you feel a gentle stretch behind your knee (hamstring). ? Do not let your knee bend while you do this. ? Keep your other leg flat on the floor. 4. Hold this position for __________ seconds. Repeat __________ times. Complete this exercise __________ times a day. Strengthening  exercises These exercises build strength and endurance in your knee. Endurance is the ability to use your muscles for a long time, even after they get tired. Quadriceps, isometric This exercise stretches the muscles in front of your thigh (quadriceps) without moving your knee joint (isometric). 1. Lie on your  back with your left / right leg extended and your other knee bent. Put a rolled towel or small pillow under your knee if told by your health care provider. 2. Slowly tense the muscles in the front of your left / right thigh. You should see your kneecap slide up toward your hip or see increased dimpling just above the knee. This motion will push the back of the knee toward the floor. 3. For __________ seconds, hold the muscle as tight as you can without increasing your pain. 4. Relax the muscles slowly and completely. Repeat __________ times. Complete this exercise __________ times a day.   Straight leg raises This exercise stretches the muscles in front of your thigh (quadriceps) and the muscles that move your hips (hip flexors). 1. Lie on your back with your left / right leg extended and your other knee bent. 2. Tense the muscles in the front of your left / right thigh. You should see your kneecap slide up or see increased dimpling just above the knee. Your thigh may even shake a bit. 3. Keep these muscles tight as you raise your leg 4-6 inches (10-15 cm) off the floor. Do not let your knee bend. 4. Hold this position for __________ seconds. 5. Keep these muscles tense as you lower your leg. 6. Relax your muscles slowly and completely after each repetition. Repeat __________ times. Complete this exercise __________ times a day. Hamstring, isometric 1. Lie on your back on a firm surface. 2. Bend your left / right knee about __________ degrees. 3. Dig your left / right heel into the surface as if you are trying to pull it toward your buttocks. Tighten the muscles in the back of your thighs  (hamstring) to "dig" as hard as you can without increasing any pain. 4. Hold this position for __________ seconds. 5. Release the tension gradually and allow your muscles to relax completely for __________ seconds after each repetition. Repeat __________ times. Complete this exercise __________ times a day. Hamstring curls If told by your health care provider, do this exercise while wearing ankle weights. Begin with __________ lb weights. Then increase the weight by 1 lb (0.5 kg) increments. Do not wear ankle weights that are more than __________ lb. 1. Lie on your abdomen with your legs straight. 2. Bend your left / right knee as far as you can without feeling pain. Keep your hips flat against the floor. 3. Hold this position for __________ seconds. 4. Slowly lower your leg to the starting position. Repeat __________ times. Complete this exercise __________ times a day.   Squats This exercise strengthens the muscles in front of your thigh and knee (quadriceps). 1. Stand in front of a table, with your feet and knees pointing straight ahead. You may rest your hands on the table for balance but not for support. 2. Slowly bend your knees and lower your hips like you are going to sit in a chair. ? Keep your weight over your heels, not over your toes. ? Keep your lower legs upright so they are parallel with the table legs. ? Do not let your hips go lower than your knees. ? Do not bend lower than told by your health care provider. ? If your knee pain increases, do not bend as low. 3. Hold the squat position for __________ seconds. 4. Slowly push with your legs to return to standing. Do not use your hands to pull yourself to standing. Repeat __________ times. Complete this exercise __________ times  a day. Wall slides This exercise strengthens the muscles in front of your thigh and knee (quadriceps). 1. Lean your back against a smooth wall or door, and walk your feet out 18-24 inches (46-61 cm) from  it. 2. Place your feet hip-width apart. 3. Slowly slide down the wall or door until your knees bend __________ degrees. Keep your knees over your heels, not over your toes. Keep your knees in line with your hips. 4. Hold this position for __________ seconds. Repeat __________ times. Complete this exercise __________ times a day.   Straight leg raises This exercise strengthens the muscles that rotate the leg at the hip and move it away from your body (hip abductors). 1. Lie on your side with your left / right leg in the top position. Lie so your head, shoulder, knee, and hip line up. You may bend your bottom knee to help you keep your balance. 2. Roll your hips slightly forward so your hips are stacked directly over each other and your left / right knee is facing forward. 3. Leading with your heel, lift your top leg 4-6 inches (10-15 cm). You should feel the muscles in your outer hip lifting. ? Do not let your foot drift forward. ? Do not let your knee roll toward the ceiling. 4. Hold this position for __________ seconds. 5. Slowly return your leg to the starting position. 6. Let your muscles relax completely after each repetition. Repeat __________ times. Complete this exercise __________ times a day.   Straight leg raises This exercise stretches the muscles that move your hips away from the front of the pelvis (hip extensors). 1. Lie on your abdomen on a firm surface. You can put a pillow under your hips if that is more comfortable. 2. Tense the muscles in your buttocks and lift your left / right leg about 4-6 inches (10-15 cm). Keep your knee straight as you lift your leg. 3. Hold this position for __________ seconds. 4. Slowly lower your leg to the starting position. 5. Let your leg relax completely after each repetition. Repeat __________ times. Complete this exercise __________ times a day. This information is not intended to replace advice given to you by your health care provider. Make  sure you discuss any questions you have with your health care provider. Document Revised: 04/30/2018 Document Reviewed: 04/30/2018 Elsevier Patient Education  2021 [employer].

## 2020-11-11 NOTE — Telephone Encounter (Signed)
Findings of benefits investigation for Reclast [id number] :   Insurance: Medicare/ [employer]   Phone:  [phone]  Plan is ACTIVE  Medicare covers 80% of the infusion and no authorization is required, and the supplement would cover the 20% of the cost that was not paid for by Medicare as long as Medicare covered the medication after patient pays her $233 Medicare B deductible. Once met coverage will be 100% for patient.

## 2020-11-11 NOTE — Telephone Encounter (Signed)
Please call patient to discuss reclast IV. Patient has had 3 reclast infusion in the past prior to moving. I advised patient you would call her to discuss and provide instructions when orders are placed. Thanks!

## 2020-11-11 NOTE — Telephone Encounter (Signed)
Patient returned call and we discussed Reclast BIV. Last infusion was in December 2014 in [place] per her recollection. She is amenable to paying the Part B deductible. Labs are pending from today's OV and patient advised that orders will not be placed until labs result

## 2020-11-11 NOTE — Telephone Encounter (Signed)
ATC patient to discuss. She has Medicare + [employer] supplement.  Will start Reclast BIV

## 2020-11-15 LAB — COMPLETE METABOLIC PANEL WITH GFR
AG Ratio: 1.8 (calc) (ref 1.0–2.5)
ALT: 19 U/L (ref 6–29)
AST: 23 U/L (ref 10–35)
Albumin: 4.7 g/dL (ref 3.6–5.1)
Alkaline phosphatase (APISO): 44 U/L (ref 37–153)
BUN: 13 mg/dL (ref 7–25)
CO2: 31 mmol/L (ref 20–32)
Calcium: 9.5 mg/dL (ref 8.6–10.4)
Chloride: 91 mmol/L — ABNORMAL LOW (ref 98–110)
Creat: 0.7 mg/dL (ref 0.60–0.93)
GFR, Est African American: 98 mL/min/{1.73_m2} (ref >=60)
GFR, Est Non African American: 84 mL/min/{1.73_m2} (ref >=60)
Globulin: 2.6 g/dL (calc) (ref 1.9–3.7)
Glucose, Bld: 86 mg/dL (ref 65–99)
Potassium: 4.3 mmol/L (ref 3.5–5.3)
Sodium: 130 mmol/L — ABNORMAL LOW (ref 135–146)
Total Bilirubin: 0.7 mg/dL (ref 0.2–1.2)
Total Protein: 7.3 g/dL (ref 6.1–8.1)

## 2020-11-15 LAB — CBC WITH DIFFERENTIAL/PLATELET
Absolute Monocytes: 632 cells/uL (ref 200–950)
Basophils Absolute: 57 cells/uL (ref 0–200)
Basophils Relative: 0.7 %
Eosinophils Absolute: 41 cells/uL (ref 15–500)
Eosinophils Relative: 0.5 %
HCT: 40.6 % (ref 35.0–45.0)
Hemoglobin: 13.6 g/dL (ref 11.7–15.5)
Lymphs Abs: 2009 cells/uL (ref 850–3900)
MCH: 30.2 pg (ref 27.0–33.0)
MCHC: 33.5 g/dL (ref 32.0–36.0)
MCV: 90 fL (ref 80.0–100.0)
MPV: 10.3 fL (ref 7.5–12.5)
Monocytes Relative: 7.8 %
Neutro Abs: 5362 cells/uL (ref 1500–7800)
Neutrophils Relative %: 66.2 %
Platelets: 271 10*3/uL (ref 140–400)
RBC: 4.51 10*6/uL (ref 3.80–5.10)
RDW: 11.4 % (ref 11.0–15.0)
Total Lymphocyte: 24.8 %
WBC: 8.1 10*3/uL (ref 3.8–10.8)

## 2020-11-15 LAB — PROTEIN ELECTROPHORESIS, SERUM, WITH REFLEX
Albumin ELP: 4.7 g/dL (ref 3.8–4.8)
Alpha 1: 0.3 g/dL (ref 0.2–0.3)
Alpha 2: 0.7 g/dL (ref 0.5–0.9)
Beta 2: 0.3 g/dL (ref 0.2–0.5)
Beta Globulin: 0.5 g/dL (ref 0.4–0.6)
Gamma Globulin: 0.8 g/dL (ref 0.8–1.7)
Total Protein: 7.4 g/dL (ref 6.1–8.1)

## 2020-11-15 LAB — PHOSPHORUS: Phosphorus: 3.9 mg/dL (ref 2.1–4.3)

## 2020-11-15 LAB — VITAMIN D 25 HYDROXY (VIT D DEFICIENCY, FRACTURES): Vit D, 25-Hydroxy: 36 ng/mL (ref 30–100)

## 2020-11-15 LAB — TSH: TSH: 1.17 mIU/L (ref 0.40–4.50)

## 2020-11-15 LAB — PARATHYROID HORMONE, INTACT (NO CA): PTH: 24 pg/mL (ref 16–77)

## 2020-11-15 NOTE — Progress Notes (Signed)
All the labs are within normal limits.  We should be able to proceed with Reclast IV.

## 2020-11-16 ENCOUNTER — Other Ambulatory Visit: Payer: Self-pay | Admitting: Pharmacist

## 2020-11-16 DIAGNOSIS — M810 Age-related osteoporosis without current pathological fracture: Secondary | ICD-10-CM

## 2020-11-16 NOTE — Telephone Encounter (Signed)
Reclast orders placed and patient was provided with phone number for [facility] in [place]. Will f/u to ensure Reclast is scheduled

## 2020-11-16 NOTE — Progress Notes (Addendum)
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   Called patient and provided with phone number [facility] [phone]) [place] . Left [place]. Will f/u to ensure Reclast is scheduled  Knox Saliva, PharmD, MPH Clinical Pharmacist (Rheumatology and Pulmonology)  Addendum: Reclast scheduled for 12/01/20. Will f/u to ensure completed

## 2020-12-01 ENCOUNTER — Ambulatory Visit (HOSPITAL_COMMUNITY)
Admission: RE | Admit: 2020-12-01 | Discharge: 2020-12-01 | Disposition: A | Discharge status: Home / Self Care | Payer: Medicare Other | Source: Ambulatory Visit | Attending: Rheumatology | Admitting: Rheumatology

## 2020-12-01 ENCOUNTER — Other Ambulatory Visit: Payer: Self-pay

## 2020-12-01 DIAGNOSIS — M810 Age-related osteoporosis without current pathological fracture: Secondary | ICD-10-CM | POA: Diagnosis present

## 2020-12-01 MED ORDER — ZOLEDRONIC ACID 5 MG/100ML IV SOLN
INTRAVENOUS | Status: AC
  Filled 2020-12-01: qty 100

## 2020-12-01 MED ORDER — ZOLEDRONIC ACID 5 MG/100ML IV SOLN
5.0000 mg | Freq: Once | INTRAVENOUS | Status: AC
  Administered 2020-12-01: 5 mg via INTRAVENOUS

## 2020-12-01 MED ORDER — DIPHENHYDRAMINE HCL 25 MG PO CAPS
ORAL_CAPSULE | ORAL | Status: AC
  Filled 2020-12-01: qty 1

## 2020-12-01 MED ORDER — ACETAMINOPHEN 325 MG PO TABS
650.0000 mg | ORAL_TABLET | Freq: Once | ORAL | Status: AC
  Administered 2020-12-01: 650 mg via ORAL

## 2020-12-01 MED ORDER — ACETAMINOPHEN 325 MG PO TABS
ORAL_TABLET | ORAL | Status: AC
  Filled 2020-12-01: qty 2

## 2020-12-01 MED ORDER — DIPHENHYDRAMINE HCL 25 MG PO CAPS
25.0000 mg | ORAL_CAPSULE | Freq: Once | ORAL | Status: AC
Start: 2020-12-01 — End: 2020-12-01
  Administered 2020-12-01: 25 mg via ORAL

## 2021-01-18 ENCOUNTER — Other Ambulatory Visit: Payer: Self-pay

## 2021-01-18 ENCOUNTER — Ambulatory Visit (INDEPENDENT_AMBULATORY_CARE_PROVIDER_SITE_OTHER): Payer: Medicare Other | Admitting: Family

## 2021-01-18 ENCOUNTER — Encounter: Payer: Self-pay | Admitting: Family

## 2021-01-18 VITALS — BP 130/72 | HR 66 | Temp 98.0°F | Ht 62.0 in | Wt 125.2 lb

## 2021-01-18 DIAGNOSIS — D869 Sarcoidosis, unspecified: Secondary | ICD-10-CM

## 2021-01-18 DIAGNOSIS — E785 Hyperlipidemia, unspecified: Secondary | ICD-10-CM

## 2021-01-18 DIAGNOSIS — M810 Age-related osteoporosis without current pathological fracture: Secondary | ICD-10-CM

## 2021-01-18 DIAGNOSIS — E039 Hypothyroidism, unspecified: Secondary | ICD-10-CM

## 2021-01-18 DIAGNOSIS — I10 Essential (primary) hypertension: Secondary | ICD-10-CM

## 2021-01-18 DIAGNOSIS — J309 Allergic rhinitis, unspecified: Secondary | ICD-10-CM

## 2021-01-18 DIAGNOSIS — I2510 Atherosclerotic heart disease of native coronary artery without angina pectoris: Secondary | ICD-10-CM

## 2021-01-18 MED ORDER — MONTELUKAST SODIUM 10 MG PO TABS: 10.0000 mg | ORAL_TABLET | Freq: Every day | ORAL | 0 refills | Status: DC

## 2021-01-18 NOTE — Progress Notes (Signed)
Brooke Sanders is a 75 y.o. female with the following history as recorded in EpicCare:  Patient Active Problem List   Diagnosis Date Noted   DDD (degenerative disc disease), cervical 11/11/2020   Chondrocalcinosis 11/11/2020   Sarcoidosis 06/24/2018   Primary osteoarthritis of both hands 06/24/2018   Primary osteoarthritis of both knees 06/24/2018   DDD (degenerative disc disease), lumbar 06/24/2018   Fibromyalgia 06/24/2018   Age-related osteoporosis without current pathological fracture 06/24/2018   Essential hypertension 06/24/2018   Dyslipidemia 06/24/2018   History of coronary artery disease 06/24/2018   History of hypothyroidism 06/24/2018    Current Outpatient Medications  Medication Sig Dispense Refill   APPLE CIDER VINEGAR PO Take by mouth daily.     aspirin EC 81 MG tablet Take by mouth.     b complex vitamins tablet Take by mouth.     Calcium-Magnesium-Vitamin D (CITRACAL SLOW RELEASE PO) Take by mouth.     COLLAGEN PO Take by mouth.     fluticasone (FLONASE) 50 MCG/ACT nasal spray Place into the nose.     hydrochlorothiazide (HYDRODIURIL) 25 MG tablet TAKE 1 TABLET(25 MG) BY MOUTH DAILY     levothyroxine (SYNTHROID, LEVOTHROID) 88 MCG tablet TAKE 1 TABLET BY MOUTH DAILY AT 6 AM     metoprolol succinate (TOPROL-XL) 25 MG 24 hr tablet Take 1 tablet by mouth daily.     montelukast (SINGULAIR) 10 MG tablet Take 1 tablet (10 mg total) by mouth at bedtime. 90 tablet 0   Multiple Vitamins-Minerals (ALIVE WOMENS ENERGY) TABS Take by mouth.      Polyethyl Glycol-Propyl Glycol (SYSTANE OP) Apply to eye.     pravastatin (PRAVACHOL) 10 MG tablet TAKE 1/2 TABLET(5 MG) BY MOUTH EVERY EVENING     pregabalin (LYRICA) 50 MG capsule Take by mouth.     Propylene Glycol 0.6 % SOLN 1 drop.     triamcinolone cream (KENALOG) 0.1 % Apply topically.     XIIDRA 5 % SOLN SMARTSIG:1 Drop(s) In Eye(s) Every 12 Hours     montelukast (SINGULAIR) 10 MG tablet Take by mouth.     No current  facility-administered medications for this visit.    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 2020-2021   cystic ectopic ovary  [place]   orbital decompression Bilateral 03/2011   ossified ganglion surgery Right 1974   SECONDARY INTRAOCULAR LENSE IMPLANTATION Right 05/2012   TOTAL THYROIDECTOMY  2011    No family history on file.  Social History   Tobacco Use   Smoking status: Never  Smokeless tobacco: Never  Substance Use Topics   Alcohol use: Yes    Comment: rarely    Subjective:  Patient presents today- in transition moving from [place] to [place];  Per patient, she is continuing to work with specialists;   Dr. Patrecia Pour- sarcoidosis/ osteoporosis Cardiology- yearly/ history of CAD/ HTN; CVA in 2005; due for follow up in 2005;  Under care of ID/ general surgery for condyloma- last seen in March 2022/ due for repeat in 2023;  Sees ophthalmology and dermatology as well;   Due for yearly CPE in August 2022;  Is unsure of date of last mammogram; wants to check records;    Objective:  Vitals:   01/18/21 0955 01/18/21 1039  BP: (!) 144/90 130/72  Pulse: 66   Temp: 98 F (36.7 C)   TempSrc: Oral    SpO2: 99%   Weight: 125 lb 3.2 oz (56.8 kg)   Height: 5\' 2"  (1.575 m)     General: Well developed, well nourished, in no acute distress  Skin : Warm and dry.  Head: Normocephalic and atraumatic  Eyes: Sclera and conjunctiva clear; pupils round and reactive to light; extraocular movements intact  Lungs: Respirations unlabored; clear to auscultation bilaterally without wheeze, rales, rhonchi  CVS exam: normal rate and regular rhythm.  Neurologic: Alert and oriented; speech intact; face symmetrical; moves all extremities well; CNII-XII intact without focal deficit   Assessment:  1. Essential hypertension   2. Hyperlipidemia, unspecified hyperlipidemia type   3. Hypothyroidism, unspecified type   4. Sarcoidosis   5. Osteoporosis, unspecified osteoporosis type, unspecified pathological fracture presence   6. Allergic rhinitis, unspecified seasonality, unspecified trigger   7. Coronary artery disease involving native heart without angina pectoris, unspecified vessel or lesion type     Plan:   Stable; continue same medications; Plan to check lipids at next OV; Plan to check TSH at next OV; Continue with rheumatology; Continue with rheumatology- on Reclast; Refill given on Singulair 10 mg daily; she will plan to see her ENT; Stable; continue with cardiology in November 2023;  She will check her records regarding mammogram and re-evaluate at upcoming [place];  This visit occurred during the SARS-CoV-2 public health emergency.  Safety protocols were in place, including screening questions prior to the visit, additional usage of staff PPE, and extensive cleaning of exam room while observing appropriate contact time as indicated for disinfecting solutions.    Return in about 3 months (around 04/20/2021) for yearly CPE.  No orders of the defined types were placed in this encounter.   Requested Prescriptions   Signed Prescriptions Disp Refills   montelukast (SINGULAIR) 10 MG tablet 90 tablet  0    Sig: Take 1 tablet (10 mg total) by mouth at bedtime.

## 2021-01-18 NOTE — Patient Instructions (Signed)
Please check on the status of your last mammogram;

## 2021-02-09 ENCOUNTER — Other Ambulatory Visit: Payer: Self-pay | Admitting: Family

## 2021-02-09 NOTE — Telephone Encounter (Signed)
Requesting: Lyrica 50mg  Contract: None UDS: None Last Visit: 01/18/2021 Next Visit: 04/21/2021 Last Refill: 05/30/2018 ?  Please Advise

## 2021-02-21 DIAGNOSIS — R053 Chronic cough: Secondary | ICD-10-CM | POA: Insufficient documentation

## 2021-04-21 ENCOUNTER — Encounter: Payer: Medicare Other | Admitting: Family

## 2021-04-26 ENCOUNTER — Ambulatory Visit (INDEPENDENT_AMBULATORY_CARE_PROVIDER_SITE_OTHER): Payer: Medicare Other | Admitting: Family

## 2021-04-26 ENCOUNTER — Telehealth (HOSPITAL_BASED_OUTPATIENT_CLINIC_OR_DEPARTMENT_OTHER): Payer: Self-pay

## 2021-04-26 ENCOUNTER — Other Ambulatory Visit: Payer: Self-pay

## 2021-04-26 VITALS — BP 120/60 | HR 70 | Temp 97.8°F | Ht 62.0 in | Wt 120.4 lb

## 2021-04-26 DIAGNOSIS — Z23 Encounter for immunization: Secondary | ICD-10-CM

## 2021-04-26 DIAGNOSIS — Z1231 Encounter for screening mammogram for malignant neoplasm of breast: Secondary | ICD-10-CM | POA: Diagnosis not present

## 2021-04-26 DIAGNOSIS — Z13220 Encounter for screening for lipoid disorders: Secondary | ICD-10-CM | POA: Diagnosis not present

## 2021-04-26 DIAGNOSIS — L989 Disorder of the skin and subcutaneous tissue, unspecified: Secondary | ICD-10-CM

## 2021-04-26 DIAGNOSIS — E039 Hypothyroidism, unspecified: Secondary | ICD-10-CM

## 2021-04-26 DIAGNOSIS — I10 Essential (primary) hypertension: Secondary | ICD-10-CM

## 2021-04-26 DIAGNOSIS — Z8639 Personal history of other endocrine, nutritional and metabolic disease: Secondary | ICD-10-CM

## 2021-04-26 DIAGNOSIS — E785 Hyperlipidemia, unspecified: Secondary | ICD-10-CM

## 2021-04-26 LAB — LIPID PANEL
Cholesterol: 173 mg/dL (ref 0–200)
HDL: 77.7 mg/dL (ref >39.00)
LDL Cholesterol: 77 mg/dL (ref 0–99)
NonHDL: 95.39
Total CHOL/HDL Ratio: 2
Triglycerides: 91 mg/dL (ref 0.0–149.0)
VLDL: 18.2 mg/dL (ref 0.0–40.0)

## 2021-04-26 LAB — TSH: TSH: 0.51 u[IU]/mL (ref 0.35–5.50)

## 2021-04-26 MED ORDER — LEVOTHYROXINE SODIUM 88 MCG PO TABS: ORAL_TABLET | ORAL | 3 refills | Status: DC

## 2021-04-26 MED ORDER — PRAVASTATIN SODIUM 10 MG PO TABS: ORAL_TABLET | ORAL | 3 refills | Status: DC

## 2021-04-26 MED ORDER — METOPROLOL SUCCINATE ER 25 MG PO TB24: 25.0000 mg | ORAL_TABLET | Freq: Every day | ORAL | 3 refills | Status: DC

## 2021-04-26 MED ORDER — PREGABALIN 50 MG PO CAPS: 50.0000 mg | ORAL_CAPSULE | Freq: Two times a day (BID) | ORAL | 1 refills | Status: DC

## 2021-04-26 MED ORDER — HYDROCHLOROTHIAZIDE 25 MG PO TABS: ORAL_TABLET | ORAL | 3 refills | Status: DC

## 2021-04-26 MED ORDER — MUPIROCIN 2 % EX OINT: 1.0000 "application " | TOPICAL_OINTMENT | Freq: Two times a day (BID) | CUTANEOUS | 0 refills | Status: DC

## 2021-04-26 NOTE — Progress Notes (Signed)
Brooke Sanders is a 75 y.o. female with the following history as recorded in EpicCare:  Patient Active Problem List   Diagnosis Date Noted   DDD (degenerative disc disease), cervical 11/11/2020   Chondrocalcinosis 11/11/2020   Sarcoidosis 06/24/2018   Primary osteoarthritis of both hands 06/24/2018   Primary osteoarthritis of both knees 06/24/2018   DDD (degenerative disc disease), lumbar 06/24/2018   Fibromyalgia 06/24/2018   Age-related osteoporosis without current pathological fracture 06/24/2018   Essential hypertension 06/24/2018   Dyslipidemia 06/24/2018   History of coronary artery disease 06/24/2018   History of hypothyroidism 06/24/2018    Current Outpatient Medications  Medication Sig Dispense Refill   APPLE CIDER VINEGAR PO Take by mouth daily.     aspirin EC 81 MG tablet Take by mouth.     b complex vitamins tablet Take by mouth.     Calcium-Magnesium-Vitamin D (CITRACAL SLOW RELEASE PO) Take by mouth.     COLLAGEN PO Take by mouth.     FLOVENT HFA 110 MCG/ACT inhaler Inhale 1 puff into the lungs 2 (two) times daily.     fluticasone (FLONASE) 50 MCG/ACT nasal spray Place into the nose.     montelukast (SINGULAIR) 10 MG tablet Take 1 tablet (10 mg total) by mouth at bedtime. 90 tablet 0   Multiple Vitamins-Minerals (ALIVE WOMENS ENERGY) TABS Take by mouth.      mupirocin ointment (BACTROBAN) 2 % Apply 1 application topically 2 (two) times daily. 22 g 0   Polyethyl Glycol-Propyl Glycol (SYSTANE OP) Apply to eye.     Propylene Glycol 0.6 % SOLN 1 drop.     triamcinolone cream (KENALOG) 0.1 % Apply topically.     XIIDRA 5 % SOLN SMARTSIG:1 Drop(s) In Eye(s) Every 12 Hours     hydrochlorothiazide (HYDRODIURIL) 25 MG tablet TAKE 1 TABLET(25 MG) BY MOUTH DAILY 90 tablet 3   levothyroxine (SYNTHROID) 88 MCG tablet TAKE 1 TABLET BY MOUTH DAILY AT 6 AM 90 tablet 3   metoprolol succinate (TOPROL-XL) 25 MG 24 hr tablet Take 1 tablet (25 mg total) by mouth daily. 90 tablet 3    pravastatin (PRAVACHOL) 10 MG tablet TAKE 1/2 TABLET(5 MG) BY MOUTH EVERY EVENING 90 tablet 3   pregabalin (LYRICA) 50 MG capsule Take 1 capsule (50 mg total) by mouth 2 (two) times daily. 180 capsule 1   No current facility-administered medications for this visit.    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 2020-2021   cystic ectopic ovary  [place]   orbital decompression Bilateral 03/2011  ossified ganglion surgery Right 1974   SECONDARY INTRAOCULAR LENSE IMPLANTATION Right 05/2012   TOTAL THYROIDECTOMY  2011    No family history on file.  Social History   Tobacco Use   Smoking status: Never   Smokeless tobacco: Never  Substance Use Topics   Alcohol use: Yes    Comment: rarely    Subjective:  Follow up on chronic care needs- requesting refills/ fasting labs; Will be getting flu shot here today and COVID vaccine through her pharmacy; Will be seeing ID provider at [facility] in March 2023 to follow up on anal dysplasia; Continuing with rheumatology for management of sarcoidosis/ osteoporosis;  Under care of ENT for chronic cough/ allergies;      Objective:  Vitals:   04/26/21 0841  BP: 120/60  Pulse: 70   Temp: 97.8 F (36.6 C)  TempSrc: Oral  SpO2: 99%  Weight: 120 lb 6.4 oz (54.6 kg)  Height: 5\' 2"  (1.575 m)    General: Well developed, well nourished, in no acute distress  Skin : Warm and dry.  Head: Normocephalic and atraumatic  Eyes: Sclera and conjunctiva clear; pupils round and reactive to light; extraocular movements intact  Ears: External normal; canals clear; tympanic membranes normal  Oropharynx: Pink, supple. No suspicious lesions  Neck: Supple without thyromegaly, adenopathy  Lungs: Respirations unlabored; clear to auscultation bilaterally without wheeze, rales, rhonchi  CVS exam: normal rate and regular rhythm.  Neurologic: Alert and oriented; speech intact; face symmetrical; moves all extremities well; CNII-XII intact without focal deficit   Assessment:  1. Hypothyroidism, unspecified type   2. Lipid screening   3. Visit for screening mammogram   4. Need for influenza vaccination   5. Essential hypertension   6. Dyslipidemia   7. Skin lesion     Plan:  Age appropriate preventive healthcare needs addressed; encouraged regular eye doctor and dental exams; encouraged regular exercise; will update labs and refills as needed today; follow-up to be determined; Flu shot given today; she is also encouraged to see dermatology about lesion on back of her left leg if does not respond to antibiotic cream given.  Follow up here in 6 months, sooner prn.   This visit occurred during the SARS-CoV-2 public health emergency.  Safety protocols were in place, including screening questions prior to the visit, additional usage of staff PPE, and extensive cleaning of exam room while observing appropriate contact time as indicated for disinfecting solutions.    No follow-ups on file.  Orders Placed This Encounter  Procedures   MM Digital Screening    Standing Status:   Future    Standing Expiration Date:   04/26/2022    Order Specific Question:   Reason for Exam (SYMPTOM  OR DIAGNOSIS  REQUIRED)    Answer:   screening mammogram    Order Specific Question:   Preferred imaging location?    Answer:   [facility]   Flu Vaccine QUAD High Dose(Fluad)   Lipid panel   TSH    Requested Prescriptions   Signed Prescriptions Disp Refills   hydrochlorothiazide (HYDRODIURIL) 25 MG tablet 90 tablet 3    Sig: TAKE 1 TABLET(25 MG) BY MOUTH DAILY   levothyroxine (SYNTHROID) 88 MCG tablet 90 tablet 3    Sig: TAKE 1 TABLET BY MOUTH DAILY AT 6 AM   metoprolol succinate (TOPROL-XL) 25 MG 24 hr tablet 90 tablet 3    Sig: Take 1 tablet (25 mg total) by mouth daily.   pravastatin (PRAVACHOL) 10  MG tablet 90 tablet 3    Sig: TAKE 1/2 TABLET(5 MG) BY MOUTH EVERY EVENING   pregabalin (LYRICA) 50 MG capsule 180 capsule 1    Sig: Take 1 capsule (50 mg total) by mouth 2 (two) times daily.   mupirocin ointment (BACTROBAN) 2 % 22 g 0    Sig: Apply 1 application topically 2 (two) times daily.

## 2021-04-26 NOTE — Patient Instructions (Signed)
Please see your dermatologist if the area on the back of your leg does not clear up; use the cream 2x per day on the area for the next 7-10 days;

## 2021-04-28 NOTE — Progress Notes (Signed)
Office Visit Note  Patient: Brooke Sanders             Date of Birth: 1945/12/08           MRN: [id number]             PCP: Marrian Salvage, FNP Referring: Malachi Pro Pamalee Leyden, PA-C Visit Date: 05/12/2021 Occupation: @GUAROCC @  Subjective:  Hair loss   History of Present Illness: Brooke Sanders is a 75 y.o. female with a history of sarcoidosis, osteoarthritis and osteoporosis.  She moved recently which was quite stressful.  She states that she had COVID-19 and flu vaccine.  She also had Reclast infusion.  She has been noticing increased hair loss.  She has some hair growth coming back.  She has been experiencing increasing stiffness in her neck which causes tenderness in the back of her head which she describes in the occipital region.  She states since her move she has been having discomfort in her shoulders and her elbows.  She has no difficulty raising her arms.  She has difficulty  lifting heavy objects.  She denies any history of joint swelling.  She denies any discomfort in her knee joints.  Activities of Daily Living:  Patient reports morning stiffness for  10 minutes.   Patient Reports nocturnal pain.  Difficulty dressing/grooming: Denies Difficulty climbing stairs: Denies Difficulty getting out of chair: Denies Difficulty using hands for taps, buttons, cutlery, and/or writing: Reports  Review of Systems  Constitutional:  Negative for fatigue.  HENT:  Positive for mouth dryness and nose dryness. Negative for mouth sores.   Eyes:  Positive for dryness. Negative for pain and itching.  Respiratory:  Negative for shortness of breath and difficulty breathing.   Cardiovascular:  Negative for chest pain and palpitations.  Gastrointestinal:  Negative for blood in stool, constipation and diarrhea.  Endocrine: Negative for increased urination.  Genitourinary:  Negative for difficulty urinating.  Musculoskeletal:  Positive for joint pain, joint pain, myalgias, muscle  weakness, morning stiffness and myalgias. Negative for joint swelling and muscle tenderness.  Skin:  Positive for hair loss. Negative for rash and redness.  Allergic/Immunologic: Negative for susceptible to infections.  Neurological:  Positive for numbness. Negative for dizziness, headaches, memory loss and weakness.  Hematological:  Negative for bruising/bleeding tendency.  Psychiatric/Behavioral:  Positive for sleep disturbance. Negative for confusion.    PMFS History:  Patient Active Problem List   Diagnosis Date Noted   DDD (degenerative disc disease), cervical 11/11/2020   Chondrocalcinosis 11/11/2020   Sarcoidosis 06/24/2018   Primary osteoarthritis of both hands 06/24/2018   Primary osteoarthritis of both knees 06/24/2018   DDD (degenerative disc disease), lumbar 06/24/2018   Fibromyalgia 06/24/2018   Age-related osteoporosis without current pathological fracture 06/24/2018   Essential hypertension 06/24/2018   Dyslipidemia 06/24/2018   History of coronary artery disease 06/24/2018   History of hypothyroidism 06/24/2018    Past Medical History:  Diagnosis Date   Bilateral artificial lens implant 2009   Bronchitis 1994   Cervical herniated disc 1986   Chronic fatigue 2005   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 No pertinent family history. Past Surgical History:  Procedure Laterality Date   ABDOMINAL HYSTERECTOMY  2000   CARPAL TUNNEL RELEASE Right 2009   CHOLECYSTECTOMY  1975   COLON SURGERY  2021   COLONOSCOPY     x3 in 6 months. 2020-2021   cystic ectopic ovary  [place]   orbital decompression Bilateral 03/2011   ossified ganglion surgery Right 1974   SECONDARY INTRAOCULAR LENSE IMPLANTATION Right 05/2012   TOTAL THYROIDECTOMY  2011   Social History   Social History Narrative   Not on file   Immunization History  Administered Date(s) Administered   Fluad Quad(high Dose 65+) 04/26/2021   PFIZER(Purple Top)SARS-COV-2 Vaccination 08/12/2019, 09/01/2019, 04/02/2020, 10/22/2020     Objective: Vital Signs: BP (!) 143/82 (BP Location: Left Arm, Patient Position: Sitting, Cuff Size: Normal)   Pulse 70   Ht 5\' 2"  (1.575 m)   Wt 124 lb 3.2 oz (56.3 kg)   BMI 22.72 kg/m    Physical Exam Vitals and nursing note reviewed.  Constitutional:      Appearance: She is well-developed.  HENT:     Head: Normocephalic and atraumatic.  Eyes:     Conjunctiva/sclera: Conjunctivae normal.  Cardiovascular:     Rate and Rhythm: Normal rate and regular rhythm.     Heart sounds: Normal heart sounds.  Pulmonary:     Effort: Pulmonary effort is normal.     Breath sounds: Normal breath sounds.  Abdominal:     General: Bowel sounds are normal.     Palpations: Abdomen is soft.  Musculoskeletal:     Cervical back: Normal range of motion.  Lymphadenopathy:     Cervical: No cervical adenopathy.  Skin:    General: Skin is warm and dry.     Capillary Refill: Capillary refill takes less than 2 seconds.  Neurological:     Mental Status: She is alert and oriented to person, place, and time.     Comments: She had no temporal artery tenderness.  No muscular weakness or tenderness was noted.  Psychiatric:        Behavior: Behavior normal.     Musculoskeletal Exam: C-spine was in good range of motion.  Shoulder joints with good range of motion.  She had tenderness over the anterior aspect of her shoulders and over the biceps tendon.  Elbow joints wrist joints MCPs PIPs and DIPs with good range of motion with no synovitis.  Hip joints, knee joints, ankles,  MTPs and PIPs with good range of motion with no synovitis.  CDAI Exam: CDAI Score: -- Patient Global: --; Provider Global: -- Swollen: --; Tender: -- Joint Exam 05/12/2021   No joint exam has been documented for this visit   There is currently no information documented on the homunculus. Go to the Rheumatology activity and complete the homunculus joint exam.  Investigation: No additional findings.  Imaging: No results found.  Recent Labs: Lab Results  Component Value Date   WBC 8.1 11/11/2020   HGB 13.6 11/11/2020   PLT 271 11/11/2020   NA 130 (L) 11/11/2020   K 4.3 11/11/2020   CL 91 (L) 11/11/2020   CO2 31 11/11/2020   GLUCOSE 86 11/11/2020   BUN 13 11/11/2020   CREATININE 0.70 11/11/2020   BILITOT 0.7 11/11/2020   AST 23 11/11/2020   ALT 19 11/11/2020  PROT 7.3 11/11/2020   PROT 7.4 11/11/2020   CALCIUM 9.5 11/11/2020   GFRAA 98 11/11/2020    Speciality Comments: No specialty comments available.  Procedures:  No procedures performed Allergies: Prednisone   Assessment / Plan:     Visit Diagnoses: Sarcoidosis - Diagnosed in 1980 after positive cervical lymph node biopsy.ACE normal.  She had no recurrence of sarcoidosis since then.  Primary osteoarthritis of both hands - clinical and radiographic findings are consistent with osteoarthritis.  Joint protection muscle strengthening was discussed.  Chronic pain of both shoulders-she complains of discomfort in her both shoulders and the over the anterior aspect since she has moved.  She has difficulty lifting objects.  She has no difficulty raising her arms.  There is no muscular weakness.  I will refer her to physical therapy.  Biceps tendinitis bilateral-she had tenderness over bilateral biceps tendon.  I will refer her to physical therapy.  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  Doing well she had no discomfort.  DDD (degenerative disc disease), cervical-she had good range of  motion of her cervical spine.  Although she believes she has some cervical strain and some occipital discomfort due to her neck pain.  Stretching exercises were discussed.  DDD (degenerative disc disease), lumbar - Status post fusion x2.  She denies any discomfort today.  Chondrocalcinosis-she has not had a flare.  Hair loss-patient has had increased hair loss recently.  She is concerned that it may be related to either flu shot or COVID vaccination or Reclast infusion.  She has been under a lot of stress and has moved recently.  Use of biotin and topical Rogaine was discussed.  Fibromyalgia-she continues to have some generalized pain and discomfort.  Other fatigue  Age-related osteoporosis without current pathological fracture - Reclast since 2012x 3. DEXA 09/20/20 The BMD measured at Femur Neck Left is 0.699 g/cm2 with a T-score of -2.4.  She has been off Reclast since 2015.  She had Reclast on Dec 01, 2020.  Dyslipidemia  History of coronary artery disease  History of hypothyroidism  Essential hypertension-blood pressure is mildly elevated today.  Orders: No orders of the defined types were placed in this encounter.  No orders of the defined types were placed in this encounter.  .  Follow-Up Instructions: Return in about 5 months (around 10/10/2021) for Osteoarthritis, Sarcoidosis, Osteoporosis.   Bo Merino, MD  Note - This record has been created using Editor, commissioning.  Chart creation errors have been sought, but may not always  have been located. Such creation errors do not reflect on  the standard of medical care.

## 2021-05-02 NOTE — Progress Notes (Signed)
Letter mailed to pt.  

## 2021-05-12 ENCOUNTER — Encounter: Payer: Self-pay | Admitting: Rheumatology

## 2021-05-12 ENCOUNTER — Other Ambulatory Visit: Payer: Self-pay

## 2021-05-12 ENCOUNTER — Ambulatory Visit (INDEPENDENT_AMBULATORY_CARE_PROVIDER_SITE_OTHER): Payer: Medicare Other | Admitting: Rheumatology

## 2021-05-12 VITALS — BP 143/82 | HR 70 | Ht 62.0 in | Wt 124.2 lb

## 2021-05-12 DIAGNOSIS — M7522 Bicipital tendinitis, left shoulder: Secondary | ICD-10-CM

## 2021-05-12 DIAGNOSIS — M25512 Pain in left shoulder: Secondary | ICD-10-CM

## 2021-05-12 DIAGNOSIS — M810 Age-related osteoporosis without current pathological fracture: Secondary | ICD-10-CM

## 2021-05-12 DIAGNOSIS — R5383 Other fatigue: Secondary | ICD-10-CM

## 2021-05-12 DIAGNOSIS — M19041 Primary osteoarthritis, right hand: Secondary | ICD-10-CM

## 2021-05-12 DIAGNOSIS — M797 Fibromyalgia: Secondary | ICD-10-CM

## 2021-05-12 DIAGNOSIS — G8929 Other chronic pain: Secondary | ICD-10-CM

## 2021-05-12 DIAGNOSIS — L659 Nonscarring hair loss, unspecified: Secondary | ICD-10-CM

## 2021-05-12 DIAGNOSIS — D869 Sarcoidosis, unspecified: Secondary | ICD-10-CM | POA: Diagnosis not present

## 2021-05-12 DIAGNOSIS — M5030 Other cervical disc degeneration, unspecified cervical region: Secondary | ICD-10-CM

## 2021-05-12 DIAGNOSIS — M7521 Bicipital tendinitis, right shoulder: Secondary | ICD-10-CM

## 2021-05-12 DIAGNOSIS — Z8679 Personal history of other diseases of the circulatory system: Secondary | ICD-10-CM

## 2021-05-12 DIAGNOSIS — M19042 Primary osteoarthritis, left hand: Secondary | ICD-10-CM

## 2021-05-12 DIAGNOSIS — M5136 Other intervertebral disc degeneration, lumbar region: Secondary | ICD-10-CM

## 2021-05-12 DIAGNOSIS — I10 Essential (primary) hypertension: Secondary | ICD-10-CM

## 2021-05-12 DIAGNOSIS — M170 Bilateral primary osteoarthritis of knee: Secondary | ICD-10-CM | POA: Diagnosis not present

## 2021-05-12 DIAGNOSIS — E785 Hyperlipidemia, unspecified: Secondary | ICD-10-CM

## 2021-05-12 DIAGNOSIS — I2510 Atherosclerotic heart disease of native coronary artery without angina pectoris: Secondary | ICD-10-CM

## 2021-05-12 DIAGNOSIS — M1120 Other chondrocalcinosis, unspecified site: Secondary | ICD-10-CM

## 2021-05-12 DIAGNOSIS — Z8639 Personal history of other endocrine, nutritional and metabolic disease: Secondary | ICD-10-CM

## 2021-05-12 DIAGNOSIS — M25511 Pain in right shoulder: Secondary | ICD-10-CM

## 2021-05-24 ENCOUNTER — Other Ambulatory Visit: Payer: Self-pay

## 2021-05-24 ENCOUNTER — Encounter (HOSPITAL_BASED_OUTPATIENT_CLINIC_OR_DEPARTMENT_OTHER): Payer: Self-pay

## 2021-05-24 ENCOUNTER — Ambulatory Visit (HOSPITAL_BASED_OUTPATIENT_CLINIC_OR_DEPARTMENT_OTHER)
Admission: RE | Admit: 2021-05-24 | Discharge: 2021-05-24 | Disposition: A | Discharge status: Home / Self Care | Payer: Medicare Other | Source: Ambulatory Visit | Attending: Family | Admitting: Family

## 2021-05-24 DIAGNOSIS — Z1231 Encounter for screening mammogram for malignant neoplasm of breast: Secondary | ICD-10-CM | POA: Insufficient documentation

## 2021-10-06 HISTORY — PX: TUMOR REMOVAL: SHX12

## 2021-11-01 ENCOUNTER — Ambulatory Visit: Payer: Medicare Other | Admitting: Family

## 2021-11-02 NOTE — Progress Notes (Signed)
? ?Office Visit Note ? ?Patient: Brooke Sanders             ?Date of Birth: [birth year]           ?MRN: [id number]             ?PCP: Marrian Salvage, [place] ?Referring: Marrian Salvage,* ?Visit Date: 11/16/2021 ?Occupation: '@GUAROCC'$ @ ? ?Subjective:  ?Discuss reclast   ? ?History of Present Illness: Brooke Sanders is a 76 y.o. female with history of sarcoidosis, fibromyalgia, osteoporosis, osteoarthritis, and DDD.  She is not currently taking any immunosuppressive agents.  She denies any recurrence of sarcoidosis.  She denies any symptoms of a flare in her eyes.  She is not experiencing eye pain, redness, or photophobia.  She denies any new or worsening pulmonary symptoms.  She denies any EN lesions or recent rashes.  She denies any increased joint pain or joint swelling.  She has occasional myalgias and muscle tenderness due to fibromyalgia.   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  The lesion was excised and she later developed a wound infection.  She was evaluated in the ED on 11/03/2021 and provided antibiotics and advised to follow up outpatient. She states the wound has completely healed.  ?She denies any recurrent falls or recent fractures.  Her last reclast infusion was in May 2022 (4th infusion total). She is taking a calcium and vitamin D supplement daily.  ? ? ?Activities of Daily Living:  ?Patient reports morning stiffness for 0 minutes.   ?Patient Reports nocturnal pain.  ?Difficulty dressing/grooming: Denies ?Difficulty climbing stairs: Denies ?Difficulty getting out of chair: Denies ?Difficulty using hands for taps, buttons, cutlery, and/or writing: Reports ? ?Review of Systems  ?Constitutional:  Positive for fatigue.  ?HENT:  Negative for mouth sores, mouth dryness and nose dryness.   ?Eyes:  Positive for dryness. Negative for pain and itching.  ?Respiratory:  Positive for shortness of breath. Negative for difficulty breathing.    ?Cardiovascular:  Negative for chest pain and palpitations.  ?Gastrointestinal:  Negative for blood in stool, constipation and diarrhea.  ?Endocrine: Negative for increased urination.  ?Genitourinary:  Negative for difficulty urinating.  ?Musculoskeletal:  Positive for myalgias and myalgias. Negative for joint pain, joint pain, joint swelling, morning stiffness and muscle tenderness.  ?Skin:  Negative for color change, rash and redness.  ?Allergic/Immunologic: Negative for susceptible to infections.  ?Neurological:  Positive for headaches. Negative for dizziness, numbness, memory loss and weakness.  ?Hematological:  Positive for bruising/bleeding tendency.  ?Psychiatric/Behavioral:  Negative for confusion.   ? ?PMFS History:  ?Patient Active Problem List  ? Diagnosis Date Noted  ? Chronic cough 02/21/2021  ? DDD (degenerative disc disease), cervical 11/11/2020  ? Chondrocalcinosis 11/11/2020  ? Condyloma 02/11/2020  ? History of colonic polyps 09/19/2019  ? Ganglion cyst of volar aspect of right wrist 07/04/2018  ? Sarcoidosis 06/24/2018  ? Primary osteoarthritis of both hands 06/24/2018  ? Primary osteoarthritis of both knees 06/24/2018  ? DDD (degenerative disc disease), lumbar 06/24/2018  ? Fibromyalgia 06/24/2018  ? Age-related osteoporosis without current pathological fracture 06/24/2018  ? Essential hypertension 06/24/2018  ? Dyslipidemia 06/24/2018  ? History of coronary artery disease 06/24/2018  ? History of hypothyroidism 06/24/2018  ? Allergy to pollen 06/06/2018  ? Coronary artery disease involving native coronary artery of native heart without angina pectoris 04/23/2017  ?  ?Past Medical History:  ?Diagnosis Date  ? Allergy 2009  ?  Arthritis   ? Bilateral artificial lens implant 2009  ? Bronchitis 1994  ? Cancer ([place]) 2023 Squamous  ? Cataract   ? Cervical herniated disc 1986  ? Chronic fatigue 2005  ? 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 Coronary artery disease 2005  ? Deviated septum 03/2011  ?  Fibromyalgia 2005  ? Graves disease 2001  ? Hepatitis C   ? History of coronary angiogram 05/2012  ? Hx of LASIK 2000  ? Hypertension   ? Optic disc hemorrhage 2005  ? Osteopenia 2005  ? Osteoporosis   ? Pneumonia 1994  ? Proptosis 2011  ? Restless leg syndrome 2005  ? Sarcoidosis 1980  ? Spondylolisthesis   ? Strabismus 2013  ? Stroke [facility])   ?  ?Family History  ?Problem Relation Age of Onset  ? Vision loss Mother   ? Varicose Veins Mother   ? Heart disease Father   ? Stroke Father   ? Vision loss Father   ? Heart disease Brother   ? Vision loss Brother   ? Vision loss Sister   ? ?Past Surgical History:  ?Procedure Laterality Date  ? ABDOMINAL HYSTERECTOMY  2000  ? CARPAL TUNNEL RELEASE Right 2009  ? CHOLECYSTECTOMY  1975  ? COLON SURGERY  2021  ? COLONOSCOPY    ? x3 in 6 months. 2020-2021  ? cystic ectopic ovary  2000  ? EYE SURGERY    ? LAMINECTOMY  1986  ? [place]  ? orbital decompression Bilateral 03/2011  ? ossified ganglion surgery Right 1974  ? SECONDARY INTRAOCULAR LENSE IMPLANTATION Right 05/2012  ? SPINE SURGERY    ? TOTAL THYROIDECTOMY  2011  ? TUMOR REMOVAL Left 10/06/2021  ? left leg  ? ?Social History  ? ?Social History Narrative  ? Lives alone, has dog-Josie.  ? 3 grown children.  ? 2 granddaughters, who live in [place].  ? ?Immunization History  ?Administered Date(s) Administered  ? Fluad Quad(high Dose 65+) 04/26/2021  ? Influenza-Unspecified 03/14/2019  ? PFIZER(Purple Top)SARS-COV-2 Vaccination 08/12/2019, 09/01/2019, 04/02/2020, 10/22/2020  ? Pneumococcal Conjugate-13 07/24/2014  ? Pneumococcal Polysaccharide-23 03/06/2018  ? Tdap 01/24/2019  ? Zoster Recombinat (Shingrix) 05/29/2018, 07/30/2018, 01/24/2019  ?  ? ?Objective: ?Vital Signs: BP 130/70 (BP Location: Left Arm, Patient Position: Sitting, Cuff Size: Normal)   Pulse 63   Ht '5\' 2"'$  (1.575 m)   Wt 125 lb 12.8 oz (57.1 kg)   BMI 23.01 kg/m?   ? ?Physical Exam ?Vitals and nursing note reviewed.  ?Constitutional:   ?    Appearance: She is well-developed.  ?HENT:  ?   Head: Normocephalic and atraumatic.  ?Eyes:  ?   Conjunctiva/sclera: Conjunctivae normal.  ?Cardiovascular:  ?   Rate and Rhythm: Normal rate and regular rhythm.  ?   Heart sounds: Normal heart sounds.  ?Pulmonary:  ?   Effort: Pulmonary effort is normal.  ?   Breath sounds: Normal breath sounds.  ?Abdominal:  ?   General: Bowel sounds are normal.  ?   Palpations: Abdomen is soft.  ?Musculoskeletal:  ?   Cervical back: Normal range of motion.  ?Skin: ?   General: Skin is warm and dry.  ?   Capillary Refill: Capillary refill takes less than 2 seconds.  ?Neurological:  ?   Mental Status: She is alert and oriented to person, place, and time.  ?Psychiatric:     ?   Behavior: Behavior normal.  ?  ? ?Musculoskeletal Exam: C-spine has slightly  limited ROM with lateral rotation. Thoracic spine and lumbar spine good ROM.  Shoulder joints, elbow joints, wrist joints, Mcps, PIPs, DIPs have good ROM with no synovitis.  Complete fist formation.  PIP and DIP thickening consistent with osteoarthritis of both hands. Hip joints, knee joints, and ankle joints have good ROM with no discomfort.  No warmth or effusion of knee joints.  No tenderness or swelling of ankle joints.  ? ?CDAI Exam: ?CDAI Score: -- ?Patient Global: --; Provider Global: -- ?Swollen: --; Tender: -- ?Joint Exam 11/16/2021  ? ?No joint exam has been documented for this visit  ? ?There is currently no information documented on the homunculus. Go to the Rheumatology activity and complete the homunculus joint exam. ? ?Investigation: ?No additional findings. ? ?Imaging: ?No results found. ? ?Recent Labs: ?Lab Results  ?Component Value Date  ? WBC 6.8 11/03/2021  ? HGB 12.6 11/03/2021  ? PLT 232 11/03/2021  ? NA 129 (L) 11/03/2021  ? K 3.8 11/03/2021  ? CL 90 (L) 11/03/2021  ? CO2 29 11/03/2021  ? GLUCOSE 85 11/03/2021  ? BUN 16 11/03/2021  ? CREATININE 0.70 11/03/2021  ? BILITOT 0.6 11/03/2021  ? ALKPHOS 41 11/03/2021  ?  AST 24 11/03/2021  ? ALT 16 11/03/2021  ? PROT 7.6 11/03/2021  ? ALBUMIN 5.0 11/03/2021  ? CALCIUM 9.8 11/03/2021  ? GFRAA 98 11/11/2020  ? ? ?Speciality Comments: Of Reclast since 2015.  Reclast infusion Dec 01, 2020

## 2021-11-03 ENCOUNTER — Ambulatory Visit (INDEPENDENT_AMBULATORY_CARE_PROVIDER_SITE_OTHER): Payer: Medicare Other

## 2021-11-03 ENCOUNTER — Other Ambulatory Visit: Payer: Self-pay

## 2021-11-03 ENCOUNTER — Emergency Department (HOSPITAL_BASED_OUTPATIENT_CLINIC_OR_DEPARTMENT_OTHER)
Admission: EM | Admit: 2021-11-03 | Discharge: 2021-11-04 | Disposition: A | Discharge status: Home / Self Care | Payer: Medicare Other | Attending: Emergency Medicine | Admitting: Emergency Medicine

## 2021-11-03 VITALS — Ht 62.0 in | Wt 124.0 lb

## 2021-11-03 DIAGNOSIS — Z79899 Other long term (current) drug therapy: Secondary | ICD-10-CM | POA: Insufficient documentation

## 2021-11-03 DIAGNOSIS — Z0000 Encounter for general adult medical examination without abnormal findings: Secondary | ICD-10-CM | POA: Diagnosis not present

## 2021-11-03 DIAGNOSIS — L089 Local infection of the skin and subcutaneous tissue, unspecified: Secondary | ICD-10-CM

## 2021-11-03 DIAGNOSIS — I10 Essential (primary) hypertension: Secondary | ICD-10-CM | POA: Diagnosis not present

## 2021-11-03 DIAGNOSIS — T8149XA Infection following a procedure, other surgical site, initial encounter: Secondary | ICD-10-CM | POA: Diagnosis not present

## 2021-11-03 DIAGNOSIS — Z7951 Long term (current) use of inhaled steroids: Secondary | ICD-10-CM | POA: Insufficient documentation

## 2021-11-03 DIAGNOSIS — Z85828 Personal history of other malignant neoplasm of skin: Secondary | ICD-10-CM | POA: Diagnosis not present

## 2021-11-03 DIAGNOSIS — Z7982 Long term (current) use of aspirin: Secondary | ICD-10-CM | POA: Diagnosis not present

## 2021-11-03 DIAGNOSIS — J45909 Unspecified asthma, uncomplicated: Secondary | ICD-10-CM | POA: Diagnosis not present

## 2021-11-03 DIAGNOSIS — Y69 Unspecified misadventure during surgical and medical care: Secondary | ICD-10-CM | POA: Diagnosis not present

## 2021-11-03 LAB — CBC WITH DIFFERENTIAL/PLATELET
Abs Immature Granulocytes: 0.02 10*3/uL (ref 0.00–0.07)
Basophils Absolute: 0.1 10*3/uL (ref 0.0–0.1)
Basophils Relative: 1 %
Eosinophils Absolute: 0.1 10*3/uL (ref 0.0–0.5)
Eosinophils Relative: 1 %
HCT: 37.2 % (ref 36.0–46.0)
Hemoglobin: 12.6 g/dL (ref 12.0–15.0)
Immature Granulocytes: 0 %
Lymphocytes Relative: 34 %
Lymphs Abs: 2.3 10*3/uL (ref 0.7–4.0)
MCH: 30.1 pg (ref 26.0–34.0)
MCHC: 33.9 g/dL (ref 30.0–36.0)
MCV: 89 fL (ref 80.0–100.0)
Monocytes Absolute: 0.7 10*3/uL (ref 0.1–1.0)
Monocytes Relative: 10 %
Neutro Abs: 3.7 10*3/uL (ref 1.7–7.7)
Neutrophils Relative %: 54 %
Platelets: 232 10*3/uL (ref 150–400)
RBC: 4.18 MIL/uL (ref 3.87–5.11)
RDW: 11.6 % (ref 11.5–15.5)
WBC: 6.8 10*3/uL (ref 4.0–10.5)
nRBC: 0 % (ref 0.0–0.2)

## 2021-11-03 LAB — COMPREHENSIVE METABOLIC PANEL
ALT: 16 U/L (ref 0–44)
AST: 24 U/L (ref 15–41)
Albumin: 5 g/dL (ref 3.5–5.0)
Alkaline Phosphatase: 41 U/L (ref 38–126)
Anion gap: 10 (ref 5–15)
BUN: 16 mg/dL (ref 8–23)
CO2: 29 mmol/L (ref 22–32)
Calcium: 9.8 mg/dL (ref 8.9–10.3)
Chloride: 90 mmol/L — ABNORMAL LOW (ref 98–111)
Creatinine, Ser: 0.7 mg/dL (ref 0.44–1.00)
GFR, Estimated: >60 mL/min (ref >60)
Glucose, Bld: 85 mg/dL (ref 70–99)
Potassium: 3.8 mmol/L (ref 3.5–5.1)
Sodium: 129 mmol/L — ABNORMAL LOW (ref 135–145)
Total Bilirubin: 0.6 mg/dL (ref 0.3–1.2)
Total Protein: 7.6 g/dL (ref 6.5–8.1)

## 2021-11-03 LAB — LACTIC ACID, PLASMA: Lactic Acid, Venous: 0.6 mmol/L (ref 0.5–1.9)

## 2021-11-03 NOTE — Telephone Encounter (Signed)
ERRONEOUS ENCOUNTER

## 2021-11-03 NOTE — Progress Notes (Signed)
? ?Subjective:  ? Brooke Sanders is a 76 y.o. female who presents for an Initial Medicare Annual Wellness Visit. ?Virtual Visit via Telephone Note ? ?I connected with  Brooke Sanders on 11/03/21 at  2:30 PM EDT by telephone and verified that I am speaking with the correct person using two identifiers. ? ?Location: ?Patient: HOME ?Provider: [facility] ?Persons participating in the virtual visit: patient/Nurse Health Advisor ?  ?I discussed the limitations, risks, security and privacy concerns of performing an evaluation and management service by telephone and the availability of in person appointments. The patient expressed understanding and agreed to proceed. ? ?Interactive audio and video telecommunications were attempted between this nurse and patient, however failed, due to patient having technical difficulties OR patient did not have access to video capability.  We continued and completed visit with audio only. ? ?Some vital signs may be absent or patient reported.  ? ?Chriss Driver, LPN ? ?Review of Systems    ? ?Cardiac Risk Factors include: advanced age (>15mn, >>3women);hypertension;dyslipidemia;sedentary lifestyle;Other (see comment), Risk factor comments: Fibromyalgia ? ?   ?Objective:  ?  ?Today's Vitals  ? 11/03/21 1424  ?Weight: 124 lb (56.2 kg)  ?Height: '5\' 2"'$  (1.575 m)  ? ?Body mass index is 22.68 kg/m?. ? ? ?  11/03/2021  ?  2:57 PM  ?Advanced Directives  ?Does Patient Have a Medical Advance Directive? Yes  ?[place] of [place]  ?Copy of [place]? No - copy requested  ?Would patient like information on creating a medical advance directive? No - Patient declined  ? ? ?Current Medications (verified) ?Outpatient Encounter Medications as of 11/03/2021  ?Medication Sig  ? APPLE CIDER VINEGAR PO Take by mouth daily.  ? aspirin 81 MG EC tablet Take 1 tablet by mouth daily.  ? b complex vitamins tablet Take by mouth.  ?  Calcium-Magnesium-Vitamin D (CITRACAL SLOW RELEASE PO) Take by mouth.  ? FLOVENT HFA 110 MCG/ACT inhaler Inhale 1 puff into the lungs 2 (two) times daily.  ? fluticasone (FLONASE) 50 MCG/ACT nasal spray Place into the nose.  ? hydrochlorothiazide (HYDRODIURIL) 25 MG tablet TAKE 1 TABLET(25 MG) BY MOUTH DAILY  ? levothyroxine (SYNTHROID) 88 MCG tablet TAKE 1 TABLET BY MOUTH DAILY AT 6 AM  ? metoprolol succinate (TOPROL-XL) 25 MG 24 hr tablet Take 1 tablet (25 mg total) by mouth daily.  ? montelukast (SINGULAIR) 10 MG tablet Take 1 tablet (10 mg total) by mouth at bedtime.  ? Multiple Vitamins-Minerals (ALIVE WOMENS ENERGY) TABS Take by mouth.   ? mupirocin ointment (BACTROBAN) 2 % Apply 1 application topically 2 (two) times daily.  ? Polyethyl Glycol-Propyl Glycol (SYSTANE OP) Apply to eye.  ? pravastatin (PRAVACHOL) 10 MG tablet TAKE 1/2 TABLET(5 MG) BY MOUTH EVERY EVENING  ? pregabalin (LYRICA) 50 MG capsule Take 1 capsule (50 mg total) by mouth 2 (two) times daily.  ? Propylene Glycol 0.6 % SOLN 1 drop.  ? RESTASIS 0.05 % ophthalmic emulsion 1 drop 2 (two) times daily.  ? triamcinolone cream (KENALOG) 0.1 % Apply topically.  ? Zoledronic Acid (RECLAST IV) Inject into the vein. Last infusion: 12/01/2020  ? [DISCONTINUED] aspirin EC 81 MG tablet Take by mouth.  ? [DISCONTINUED] COLLAGEN PO Take by mouth.  ? [DISCONTINUED] XIIDRA 5 % SOLN SMARTSIG:1 Drop(s) In Eye(s) Every 12 Hours  ? ?No facility-administered encounter medications on file as of 11/03/2021.  ? ? ?Allergies (verified) ?Prednisone  ? ?  History: ?Past Medical History:  ?Diagnosis Date  ? Allergy 2009  ? Arthritis   ? Bilateral artificial lens implant 2009  ? Bronchitis 1994  ? Cancer ([place]) 2023 Squamous  ? Cataract   ? Cervical herniated disc 1986  ? Chronic fatigue 2005  ? 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 Coronary artery disease 2005  ? Deviated septum 03/2011  ? Fibromyalgia 2005  ? Graves disease 2001  ? Hepatitis C   ? History of coronary  angiogram 05/2012  ? Hx of LASIK 2000  ? Hypertension   ? Optic disc hemorrhage 2005  ? Osteopenia 2005  ? Osteoporosis   ? Pneumonia 1994  ? Proptosis 2011  ? Restless leg syndrome 2005  ? Sarcoidosis 1980  ? Spondylolisthesis   ? Strabismus 2013  ? Stroke [facility])   ? ?Past Surgical History:  ?Procedure Laterality Date  ? ABDOMINAL HYSTERECTOMY  2000  ? CARPAL TUNNEL RELEASE Right 2009  ? CHOLECYSTECTOMY  1975  ? COLON SURGERY  2021  ? COLONOSCOPY    ? x3 in 6 months. 2020-2021  ? cystic ectopic ovary  2000  ? EYE SURGERY    ? LAMINECTOMY  1986  ? [place]  ? orbital decompression Bilateral 03/2011  ? ossified ganglion surgery Right 1974  ? SECONDARY INTRAOCULAR LENSE IMPLANTATION Right 05/2012  ? SPINE SURGERY    ? TOTAL THYROIDECTOMY  2011  ? ?Family History  ?Problem Relation Age of Onset  ? Vision loss Mother   ? Varicose Veins Mother   ? Heart disease Father   ? Stroke Father   ? Vision loss Father   ? Heart disease Brother   ? Vision loss Brother   ? Vision loss Sister   ? ?Social History  ? ?Socioeconomic History  ? Marital status: Divorced  ?  Spouse name: Not on file  ? Number of children: 3  ? Years of education: Not on file  ? Highest education level: Not on file  ?Occupational History  ? Not on file  ?Tobacco Use  ? Smoking status: Never  ? Smokeless tobacco: Never  ?Vaping Use  ? Vaping Use: Never used  ?Substance and Sexual Activity  ? Alcohol use: Not Currently  ?  Alcohol/week: 2.0 standard drinks  ?  Types: 1 Glasses of wine, 1 Cans of beer per week  ?  Comment: rarely  ? Drug use: Never  ? Sexual activity: Not Currently  ?Other Topics Concern  ? Not on file  ?Social History Narrative  ? Lives alone, has dog-Josie.  ? 3 grown children.  ? 2 granddaughters, who live in [place].  ? ?Social Determinants of Health  ? ?Financial Resource Strain: Low Risk   ? Difficulty of Paying Living Expenses: Not hard at all  ?Food Insecurity: No Food Insecurity  ? Worried About Charity fundraiser in the  Last Year: Never true  ? Ran Out of Food in the Last Year: Never true  ?Transportation Needs: No Transportation Needs  ? Lack of Transportation (Medical): No  ? Lack of Transportation (Non-Medical): No  ?Physical Activity: Sufficiently Active  ? Days of Exercise per Week: 5 days  ? Minutes of Exercise per Session: 30 min  ?Stress: No Stress Concern Present  ? Feeling of Stress : Not at all  ?Social Connections: Moderately Isolated  ? Frequency of Communication with Friends and Family: More than three times a week  ? Frequency of Social Gatherings with Friends and  Family: More than three times a week  ? Attends Religious Services: Never  ? Active Member of Clubs or Organizations: No  ? Attends Archivist Meetings: More than 4 times per year  ? Marital Status: Divorced  ? ? ?Tobacco Counseling ?Counseling given: Not Answered ? ? ?Clinical Intake: ? ?Pre-visit preparation completed: Yes ? ?Pain : No/denies pain ? ?  ? ?BMI - recorded: 22.68 ?Nutritional Status: BMI of 19-24  Normal ?Nutritional Risks: None ?Diabetes: No ? ?How often do you need to have someone help you when you read instructions, pamphlets, or other written materials from your doctor or pharmacy?: 1 - Never ? ?Diabetic?NO ? ?Interpreter Needed?: No ? ?Information entered by :: mj Baruc Tugwell, lpn ? ? ?Activities of Daily Living ? ?  11/03/2021  ?  3:02 PM  ?In your present state of health, do you have any difficulty performing the following activities:  ?Hearing? 1  ?Vision? 0  ?Difficulty concentrating or making decisions? 0  ?Walking or climbing stairs? 0  ?Dressing or bathing? 0  ?Doing errands, shopping? 0  ?Preparing Food and eating ? N  ?Using the Toilet? N  ?In the past six months, have you accidently leaked urine? N  ?Do you have problems with loss of bowel control? N  ?Managing your Medications? N  ?Managing your Finances? N  ?Housekeeping or managing your Housekeeping? N  ? ? ?Patient Care Team: ?Marrian Salvage, FNP as PCP -  General (Internal Medicine) ? ?Indicate any recent Medical Services you may have received from other than [facility] providers in the past year (date may be approximate). ? ?   ?Assessment:  ? This is a routine wellness

## 2021-11-03 NOTE — ED Triage Notes (Signed)
Pt recommended ED visit by Urgent Care. States that on March 10th pt had mole surgery to left lower leg at [facility]. Wound healed appropriately. 2 days ago pt noticed new onset redness and pus like drainage from site. No fever/chills. Endorses increased fatigue.  ?

## 2021-11-03 NOTE — Patient Instructions (Signed)
Ms. Fotopoulos , ?Thank you for taking time to come for your Medicare Wellness Visit. I appreciate your ongoing commitment to your health goals. Please review the following plan we discussed and let me know if I can assist you in the future.  ? ?Screening recommendations/referrals: ?Colonoscopy: Sigmoidoscopy done 09/2021 at [facility]. Follow up as directed by your provider. ?Mammogram: Done 06/01/2021 Repeat annually ? ?Bone Density: Done 09/20/2020 Repeat every 2 years ? ?Recommended yearly ophthalmology/optometry visit for glaucoma screening and checkup ?Recommended yearly dental visit for hygiene and checkup ? ?Vaccinations: ?Influenza vaccine: Done 04/26/2021 Repeat annually ? ?Pneumococcal vaccine: Done 07/24/2013 and 03/06/2018 ?Tdap vaccine: Done 02/20/2019 Repeat in 10 years ? ?Shingles vaccine: Done 01/24/2019, 07/30/2018 and 05/29/2018   ?Covid-19:Done 10/22/2020, 04/02/2020, 09/01/2019 and 08/12/2019 ? ?Advanced directives: Please bring a copy of your health care power of attorney and living will to the office to be added to your chart at your convenience. ? ? ?Conditions/risks identified: KEEP UP THE GOOD WORK!! ? ?Next appointment: Follow up in one year for your annual wellness visit 2024. ? ? ?Preventive Care 87 Years and Older, Female ?Preventive care refers to lifestyle choices and visits with your health care provider that can promote health and wellness. ?What does preventive care include? ?A yearly physical exam. This is also called an annual well check. ?Dental exams once or twice a year. ?Routine eye exams. Ask your health care provider how often you should have your eyes checked. ?Personal lifestyle choices, including: ?Daily care of your teeth and gums. ?Regular physical activity. ?Eating a healthy diet. ?Avoiding tobacco and drug use. ?Limiting alcohol use. ?Practicing safe sex. ?Taking low-dose aspirin every day. ?Taking vitamin and mineral supplements as recommended by your health care provider. ?What  happens during an annual well check? ?The services and screenings done by your health care provider during your annual well check will depend on your age, overall health, lifestyle risk factors, and family history of disease. ?Counseling  ?Your health care provider may ask you questions about your: ?Alcohol use. ?Tobacco use. ?Drug use. ?Emotional well-being. ?Home and relationship well-being. ?Sexual activity. ?Eating habits. ?History of falls. ?Memory and ability to understand (cognition). ?Work and work Statistician. ?Reproductive health. ?Screening  ?You may have the following tests or measurements: ?Height, weight, and BMI. ?Blood pressure. ?Lipid and cholesterol levels. These may be checked every 5 years, or more frequently if you are over 33 years old. ?Skin check. ?Lung cancer screening. You may have this screening every year starting at age 54 if you have a 30-pack-year history of smoking and currently smoke or have quit within the past 15 years. ?Fecal occult blood test (FOBT) of the stool. You may have this test every year starting at age 32. ?Flexible sigmoidoscopy or colonoscopy. You may have a sigmoidoscopy every 5 years or a colonoscopy every 10 years starting at age 33. ?Hepatitis C blood test. ?Hepatitis B blood test. ?Sexually transmitted disease (STD) testing. ?Diabetes screening. This is done by checking your blood sugar (glucose) after you have not eaten for a while (fasting). You may have this done every 1-3 years. ?Bone density scan. This is done to screen for osteoporosis. You may have this done starting at age 19. ?Mammogram. This may be done every 1-2 years. Talk to your health care provider about how often you should have regular mammograms. ?Talk with your health care provider about your test results, treatment options, and if necessary, the need for more tests. ?Vaccines  ?Your health care provider  may recommend certain vaccines, such as: ?Influenza vaccine. This is recommended every  year. ?Tetanus, diphtheria, and acellular pertussis (Tdap, Td) vaccine. You may need a Td booster every 10 years. ?Zoster vaccine. You may need this after age 38. ?Pneumococcal 13-valent conjugate (PCV13) vaccine. One dose is recommended after age 9. ?Pneumococcal polysaccharide (PPSV23) vaccine. One dose is recommended after age 64. ?Talk to your health care provider about which screenings and vaccines you need and how often you need them. ?This information is not intended to replace advice given to you by your health care provider. Make sure you discuss any questions you have with your health care provider. ?Document Released: 08/06/2015 Document Revised: 03/29/2016 Document Reviewed: 05/11/2015 ?Elsevier Interactive Patient Education ? 2017 [place]. ? ?Fall Prevention in the Home ?Falls can cause injuries. They can happen to people of all ages. There are many things you can do to make your home safe and to help prevent falls. ?What can I do on the outside of my home? ?Regularly fix the edges of walkways and driveways and fix any cracks. ?Remove anything that might make you trip as you walk through a door, such as a raised step or threshold. ?Trim any bushes or trees on the path to your home. ?Use bright outdoor lighting. ?Clear any walking paths of anything that might make someone trip, such as rocks or tools. ?Regularly check to see if handrails are loose or broken. Make sure that both sides of any steps have handrails. ?Any raised decks and porches should have guardrails on the edges. ?Have any leaves, snow, or ice cleared regularly. ?Use sand or salt on walking paths during winter. ?Clean up any spills in your garage right away. This includes oil or grease spills. ?What can I do in the bathroom? ?Use night lights. ?Install grab bars by the toilet and in the tub and shower. Do not use towel bars as grab bars. ?Use non-skid mats or decals in the tub or shower. ?If you need to sit down in the shower, use a  plastic, non-slip stool. ?Keep the floor dry. Clean up any water that spills on the floor as soon as it happens. ?Remove soap buildup in the tub or shower regularly. ?Attach bath mats securely with double-sided non-slip rug tape. ?Do not have throw rugs and other things on the floor that can make you trip. ?What can I do in the bedroom? ?Use night lights. ?Make sure that you have a light by your bed that is easy to reach. ?Do not use any sheets or blankets that are too big for your bed. They should not hang down onto the floor. ?Have a firm chair that has side arms. You can use this for support while you get dressed. ?Do not have throw rugs and other things on the floor that can make you trip. ?What can I do in the kitchen? ?Clean up any spills right away. ?Avoid walking on wet floors. ?Keep items that you use a lot in easy-to-reach places. ?If you need to reach something above you, use a strong step stool that has a grab bar. ?Keep electrical cords out of the way. ?Do not use floor polish or wax that makes floors slippery. If you must use wax, use non-skid floor wax. ?Do not have throw rugs and other things on the floor that can make you trip. ?What can I do with my stairs? ?Do not leave any items on the stairs. ?Make sure that there are handrails on both sides  of the stairs and use them. Fix handrails that are broken or loose. Make sure that handrails are as long as the stairways. ?Check any carpeting to make sure that it is firmly attached to the stairs. Fix any carpet that is loose or worn. ?Avoid having throw rugs at the top or bottom of the stairs. If you do have throw rugs, attach them to the floor with carpet tape. ?Make sure that you have a light switch at the top of the stairs and the bottom of the stairs. If you do not have them, ask someone to add them for you. ?What else can I do to help prevent falls? ?Wear shoes that: ?Do not have high heels. ?Have rubber bottoms. ?Are comfortable and fit you  well. ?Are closed at the toe. Do not wear sandals. ?If you use a stepladder: ?Make sure that it is fully opened. Do not climb a closed stepladder. ?Make sure that both sides of the stepladder are locked into place. ?A

## 2021-11-04 ENCOUNTER — Telehealth: Payer: Self-pay | Admitting: *Deleted

## 2021-11-04 DIAGNOSIS — T8149XA Infection following a procedure, other surgical site, initial encounter: Secondary | ICD-10-CM | POA: Diagnosis not present

## 2021-11-04 MED ORDER — CEFTRIAXONE SODIUM 1 G IJ SOLR: 1.0000 g | Freq: Once | INTRAMUSCULAR | Status: DC

## 2021-11-04 MED ORDER — SODIUM CHLORIDE 0.9 % IV SOLN
1.0000 g | Freq: Once | INTRAVENOUS | Status: AC
  Administered 2021-11-04: 1 g via INTRAVENOUS
  Filled 2021-11-04: qty 10

## 2021-11-04 MED ORDER — CEPHALEXIN 500 MG PO CAPS: 500.0000 mg | ORAL_CAPSULE | Freq: Four times a day (QID) | ORAL | 0 refills | Status: DC

## 2021-11-04 NOTE — ED Notes (Signed)
ED Provider at bedside. 

## 2021-11-04 NOTE — ED Provider Notes (Signed)
?[facility] EMERGENCY DEPT ?Provider Note ? ? ?CSN: [id number] ?Arrival date & time: 11/03/21  1934 ? ?  ? ?History ? ?Chief Complaint  ?Patient presents with  ? Wound Infection  ? ? ?Brooke Sanders is a 76 y.o. female. ? 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  This was performed in March at a [facility] in [place].  Patient presenting today with drainage coming from the wound.  The wound had seemed to heal well following surgery, but she woke up 2 mornings ago with her pajama bottoms sticking to the back of her leg and noted that it drained some yellow fluid.  Patient was seen at urgent care today, then referred here for further evaluation.  She denies any fevers or chills.  She denies any injury or trauma. ? ?The history is provided by the patient.  ? ?  ? ?Home Medications ?Prior to Admission medications   ?Medication Sig Start Date End Date Taking? Authorizing Provider  ?APPLE CIDER VINEGAR PO Take by mouth daily.    [provider]  ?aspirin 81 MG EC tablet Take 1 tablet by mouth daily.    [provider]  ?b complex vitamins tablet Take by mouth.    [provider]  ?Calcium-Magnesium-Vitamin D (CITRACAL SLOW RELEASE PO) Take by mouth.    [provider]  ?FLOVENT HFA 110 MCG/ACT inhaler Inhale 1 puff into the lungs 2 (two) times daily. 02/25/21   [provider]  ?fluticasone (FLONASE) 50 MCG/ACT nasal spray Place into the nose.    [provider]  ?hydrochlorothiazide (HYDRODIURIL) 25 MG tablet TAKE 1 TABLET(25 MG) BY MOUTH DAILY 04/26/21   Marrian Salvage, [place]  ?levothyroxine (SYNTHROID) 88 MCG tablet TAKE 1 TABLET BY MOUTH DAILY AT 6 AM 04/26/21   Marrian Salvage, FNP  ?metoprolol succinate (TOPROL-XL) 25 MG 24 hr tablet Take 1 tablet (25 mg total) by mouth daily. 04/26/21    Marrian Salvage, [place]  ?montelukast (SINGULAIR) 10 MG tablet Take 1 tablet (10 mg total) by mouth at bedtime. 01/18/21   Marrian Salvage, [place]  ?Multiple Vitamins-Minerals (ALIVE WOMENS ENERGY) TABS Take by mouth.     [provider]  ?mupirocin ointment (BACTROBAN) 2 % Apply 1 application topically 2 (two) times daily. 04/26/21   Marrian Salvage, [place]  ?Polyethyl Glycol-Propyl Glycol (SYSTANE OP) Apply to eye.    [provider]  ?pravastatin (PRAVACHOL) 10 MG tablet TAKE 1/2 TABLET(5 MG) BY MOUTH EVERY EVENING 04/26/21   Marrian Salvage, [place]  ?pregabalin (LYRICA) 50 MG capsule Take 1 capsule (50 mg total) by mouth 2 (two) times daily. 04/26/21   Marrian Salvage, [place]  ?Propylene Glycol 0.6 % SOLN 1 drop.    [provider]  ?RESTASIS 0.05 % ophthalmic emulsion 1 drop 2 (two) times daily. 10/24/21   [provider]  ?triamcinolone cream (KENALOG) 0.1 % Apply topically. 11/21/17   [provider]  ?Zoledronic Acid (RECLAST IV) Inject into the vein. Last infusion: 12/01/2020    [provider]  ?   ? ?Allergies    ?Prednisone   ? ?Review of Systems   ?Review of Systems  ?All other systems reviewed and are negative. ? ?Physical Exam ?Updated Vital Signs ?BP 134/61   Pulse (!) 58   Temp 98.1 ?F (36.7 ?C) (Oral)   Resp 16  Ht '5\' 2"'$  (1.575 m)   Wt 57.6 kg   SpO2 93%   BMI 23.23 kg/m?  ?Physical Exam ?Vitals and nursing note reviewed.  ?Constitutional:   ?   Appearance: Normal appearance.  ?HENT:  ?   Head: Normocephalic and atraumatic.  ?Pulmonary:  ?   Effort: Pulmonary effort is normal.  ?Musculoskeletal:  ?   Comments: The posterior aspect of the left calf has a quarter sized area of eschar with surrounding erythema.  There is no fluctuance or purulent material able to be expressed.  See photo below.  Distal PMS is intact.  ?Skin: ?   General: Skin is warm and dry.  ?Neurological:  ?   Mental Status: She is alert.  ? ? ? ? ?ED Results  / Procedures / Treatments   ?Labs ?(all labs ordered are listed, but only abnormal results are displayed) ?Labs Reviewed  ?COMPREHENSIVE METABOLIC PANEL - Abnormal; Notable for the following components:  ?    Result Value  ? Sodium 129 (*)   ? Chloride 90 (*)   ? All other components within normal limits  ?LACTIC ACID, PLASMA  ?CBC WITH DIFFERENTIAL/PLATELET  ?LACTIC ACID, PLASMA  ? ? ?EKG ?None ? ?Radiology ?No results found. ? ?Procedures ?Procedures  ? ? ?Medications Ordered in ED ?Medications  ?cefTRIAXone (ROCEPHIN) injection 1 g (has no administration in time range)  ? ? ?ED Course/ Medical Decision Making/ A&P ? ?Patient presenting with wound drainage from her incision site where she had a skin cancer removed.  Whether this represents a seroma or infection, I am uncertain.  Patient has no fever and no white count and there is no significant warmth to the surrounding skin.  I am unable to palpate any fluctuance that would suggest an abscess.  At this point, feel as though patient can be treated with antibiotics and outpatient follow-up.  I have advised her to call the clinic where her procedure was performed and have them follow her up in the next few days. ? ?Final Clinical Impression(s) / ED Diagnoses ?Final diagnoses:  ?None  ? ? ?Rx / DC Orders ?ED Discharge Orders   ? ? None  ? ?  ? ? ?  ?Veryl Speak, MD ?11/04/21 0025 ? ?

## 2021-11-04 NOTE — Discharge Instructions (Addendum)
Begin taking Keflex as prescribed. ? ?Perform dressing changes twice daily for the next several days. ? ?Follow-up with your surgeon in the next few days, and return to the ER if you develop high fever, worsening drainage or redness, increasing pain, or other new or concerning symptoms. ?

## 2021-11-04 NOTE — Telephone Encounter (Signed)
Who Is Calling Patient / Member / Family / Caregiver ?Call Type Triage / Clinical ?Relationship To Patient Self ?Return Phone Number [phone] (Primary) ?Chief Complaint Wound Check or Dressing Change ?Reason for Call Symptomatic / Request for Health Information ?Initial Comment Caller states they had a tumor removed from ?left leg and around the 10th of march it was ?completely healed over. Slow to heel and took 2 ?weeks before wraps were removed. Has been back ?to walking but woke up yesterday with watery ?pink leakage. Cleaned and applied Vaseline and ?a large bandage. Felt no pain but itching. This ?morning when replacing bandage and letting it air ?out, there were traces of blood and more drainage. ?There's a measurable amount oozing out and it is ?very red. Called the clinic where the surgery was ?performed but has not heard anything back. ?[facility] Not Listed urgent Care in [place] ?Translation No ?Nurse Assessment ?Nurse: Hassell Done, RN, Melanie Date/Time Eilene Ghazi Time): 11/03/2021 4:35:29 PM ?Confirm and document reason for call. If ?symptomatic, describe symptoms. ?---Caller states she had a tumor removed from her L ?leg and this was March 10. It had completely healed ?and then yesterday area started draining again. There is ?itching and a large red area where the drainage is. ?Does the patient have any new or worsening ?symptoms? ---Yes ?Will a triage be completed? ---Yes ?Related visit to physician within the last 2 weeks? ---Yes ?Does the PT have any chronic conditions? (i.e. ?diabetes, asthma, this includes High risk factors for ?pregnancy, etc.) ?---Yes ?List chronic conditions. ---lots of autoimmune disorders ?Is this a behavioral health or substance abuse call? ---No ?PLEASE NOTE: All timestamps contained within this report are represented as [place] Standard Time. ?CONFIDENTIALTY NOTICE: This fax transmission is intended only for the addressee. It contains information that is legally  privileged, confidential or ?otherwise protected from use or disclosure. If you are not the intended recipient, you are strictly prohibited from reviewing, disclosing, copying using ?or disseminating any of this information or taking any action in reliance on or regarding this information. If you have received this fax in error, please ?notify us immediately by telephone so that we can arrange for its return to [place]. Phone: [phone], Toll-Free: [phone], Fax: [phone] ?Page: 2 of 2 ?Call Id: [id number] ?Guidelines ?Guideline Title Affirmed Question Affirmed Notes Nurse Date/Time (Eastern ?Time) ?Wound Infection [1] Skin around the ?wound has become ?red AND [2] larger ?than 2 inches (5 cm) ?Hassell Done, RN, Melanie 11/03/2021 4:40:12 ?PM ? ?

## 2021-11-04 NOTE — ED Notes (Signed)
No adverse effect of IVPB Rocephin observed -- pt remains stable after being monitored for additional 5mnutes after med completion.  Now ambulatory to checkout desk- gait steady; no distress.  L wound re-dressed prior to d/c with Telfa then wrapped with stretch gauze bandage and covered with cobane; well tolerated by pt -- PMS intact before and after dressing applied.   ?

## 2021-11-04 NOTE — Telephone Encounter (Signed)
Patient went to ER>

## 2021-11-10 ENCOUNTER — Ambulatory Visit: Payer: Medicare Other | Admitting: Physician Assistant

## 2021-11-11 ENCOUNTER — Ambulatory Visit: Payer: Medicare Other | Admitting: Family

## 2021-11-16 ENCOUNTER — Ambulatory Visit (INDEPENDENT_AMBULATORY_CARE_PROVIDER_SITE_OTHER): Payer: Medicare Other | Admitting: Physician Assistant

## 2021-11-16 ENCOUNTER — Encounter: Payer: Self-pay | Admitting: Physician Assistant

## 2021-11-16 VITALS — BP 130/70 | HR 63 | Ht 62.0 in | Wt 125.8 lb

## 2021-11-16 DIAGNOSIS — Z8639 Personal history of other endocrine, nutritional and metabolic disease: Secondary | ICD-10-CM

## 2021-11-16 DIAGNOSIS — D869 Sarcoidosis, unspecified: Secondary | ICD-10-CM | POA: Diagnosis not present

## 2021-11-16 DIAGNOSIS — M7521 Bicipital tendinitis, right shoulder: Secondary | ICD-10-CM

## 2021-11-16 DIAGNOSIS — M5030 Other cervical disc degeneration, unspecified cervical region: Secondary | ICD-10-CM | POA: Diagnosis not present

## 2021-11-16 DIAGNOSIS — M19042 Primary osteoarthritis, left hand: Secondary | ICD-10-CM

## 2021-11-16 DIAGNOSIS — E785 Hyperlipidemia, unspecified: Secondary | ICD-10-CM

## 2021-11-16 DIAGNOSIS — M170 Bilateral primary osteoarthritis of knee: Secondary | ICD-10-CM | POA: Diagnosis not present

## 2021-11-16 DIAGNOSIS — C44729 Squamous cell carcinoma of skin of left lower limb, including hip: Secondary | ICD-10-CM

## 2021-11-16 DIAGNOSIS — Z5181 Encounter for therapeutic drug level monitoring: Secondary | ICD-10-CM

## 2021-11-16 DIAGNOSIS — M19041 Primary osteoarthritis, right hand: Secondary | ICD-10-CM

## 2021-11-16 DIAGNOSIS — M810 Age-related osteoporosis without current pathological fracture: Secondary | ICD-10-CM

## 2021-11-16 DIAGNOSIS — M7522 Bicipital tendinitis, left shoulder: Secondary | ICD-10-CM

## 2021-11-16 DIAGNOSIS — M1120 Other chondrocalcinosis, unspecified site: Secondary | ICD-10-CM

## 2021-11-16 DIAGNOSIS — R5383 Other fatigue: Secondary | ICD-10-CM

## 2021-11-16 DIAGNOSIS — M5136 Other intervertebral disc degeneration, lumbar region: Secondary | ICD-10-CM

## 2021-11-16 DIAGNOSIS — M797 Fibromyalgia: Secondary | ICD-10-CM

## 2021-11-16 DIAGNOSIS — G8929 Other chronic pain: Secondary | ICD-10-CM

## 2021-11-16 DIAGNOSIS — M51369 Other intervertebral disc degeneration, lumbar region without mention of lumbar back pain or lower extremity pain: Secondary | ICD-10-CM

## 2021-11-16 DIAGNOSIS — I10 Essential (primary) hypertension: Secondary | ICD-10-CM

## 2021-11-16 DIAGNOSIS — L659 Nonscarring hair loss, unspecified: Secondary | ICD-10-CM

## 2021-11-16 DIAGNOSIS — Z8679 Personal history of other diseases of the circulatory system: Secondary | ICD-10-CM

## 2021-11-17 ENCOUNTER — Telehealth: Payer: Self-pay | Admitting: Rheumatology

## 2021-11-17 NOTE — Telephone Encounter (Signed)
error 

## 2021-12-02 ENCOUNTER — Telehealth: Payer: Self-pay | Admitting: Family

## 2021-12-02 NOTE — Telephone Encounter (Signed)
Patient is interested in switching to a provider closer to her house.   ? ?Is this ok with both providers? ?

## 2021-12-02 NOTE — Telephone Encounter (Signed)
Okay to schedule

## 2021-12-06 NOTE — Progress Notes (Signed)
HPI: Brooke Sanders is a 76 y.o. female, who is here today to establish care.  Former PCP: Marvis Repress. Last AWV on 11/03/21.  Chronic medical problems: Fibromyalgia, hypertension, hyperlipidemia, hypothyroidism,sarcoidosis (in remission), lower back pain, cervical pain, and osteoporosis among some. Rhinitis: Follows with ENT.  Hyperlipidemia on pravastatin 10 mg daily. CAD: Follows with cardiologist. Lab Results  Component Value Date   CHOL 173 04/26/2021   HDL 77.70 04/26/2021   LDLCALC 77 04/26/2021   TRIG 91.0 04/26/2021   CHOLHDL 2 04/26/2021   Hypothyroidism: S/P total thyroidectomy. She is on levothyroxine 88 mcg daily.  Lab Results  Component Value Date   TSH 0.51 04/26/2021   Hypertension: She is on metoprolol succinate 25 mg daily and HCTZ 25 mg daily. Negative for severe/frequent headache, visual changes, chest pain, dyspnea, claudication, focal weakness, or edema. Hyponatremia, which seems to be chronic. Lab Results  Component Value Date   CREATININE 0.70 11/03/2021   BUN 16 11/03/2021   NA 129 (L) 11/03/2021   K 3.8 11/03/2021   CL 90 (L) 11/03/2021   CO2 29 11/03/2021   Chronic pain, fibromyalgia: She is on Lyrica 50 mg bid, which has helped with generalized myalgias. Sleeps about 5-6 hours, gets up a few times throuhg the night to urinate.  Osteoporosis: She follows with rheumatologist, last seen on 11/16/2021. Reclast x 4 in the past 7 years, holding it this year because hair loss.  Concerns today:  S/P skin SCC excision of left calf. Wound still open and draining intermittently. It happened when scab is removed accidentally at night, so it oozes. ED evaluation on 11/03/21, she has completed abx treatment and in general it is improving. Negative for fever,chills,or worsening pain.  Review of Systems  Constitutional:  Positive for fatigue. Negative for activity change and appetite change.  HENT:  Negative for mouth sores, nosebleeds and  trouble swallowing.   Respiratory:  Negative for cough and wheezing.   Gastrointestinal:  Negative for abdominal pain, nausea and vomiting.       Negative for changes in bowel habits.  Genitourinary:  Negative for decreased urine volume, dysuria and hematuria.  Musculoskeletal:  Positive for arthralgias, back pain, myalgias and neck pain. Negative for gait problem.  Neurological:  Negative for syncope, facial asymmetry and weakness.  Rest see pertinent positives and negatives per HPI.  Current Outpatient Medications on File Prior to Visit  Medication Sig Dispense Refill   APPLE CIDER VINEGAR PO Take by mouth daily.     aspirin 81 MG EC tablet Take 1 tablet by mouth daily.     b complex vitamins tablet Take by mouth.     Calcium-Magnesium-Vitamin D (CITRACAL SLOW RELEASE PO) Take by mouth.     FLOVENT HFA 110 MCG/ACT inhaler Inhale 1 puff into the lungs 2 (two) times daily.     fluticasone (FLONASE) 50 MCG/ACT nasal spray Place into the nose as needed.     hydrochlorothiazide (HYDRODIURIL) 25 MG tablet TAKE 1 TABLET(25 MG) BY MOUTH DAILY 90 tablet 3   levothyroxine (SYNTHROID) 88 MCG tablet TAKE 1 TABLET BY MOUTH DAILY AT 6 AM 90 tablet 3   metoprolol succinate (TOPROL-XL) 25 MG 24 hr tablet Take 1 tablet (25 mg total) by mouth daily. 90 tablet 3   montelukast (SINGULAIR) 10 MG tablet Take 1 tablet (10 mg total) by mouth at bedtime. 90 tablet 0   Multiple Vitamins-Minerals (ALIVE WOMENS ENERGY) TABS Take by mouth.      Polyethyl Glycol-Propyl  Glycol (SYSTANE OP) Apply to eye.     pravastatin (PRAVACHOL) 10 MG tablet TAKE 1/2 TABLET(5 MG) BY MOUTH EVERY EVENING 90 tablet 3   pregabalin (LYRICA) 50 MG capsule Take 1 capsule (50 mg total) by mouth 2 (two) times daily. 180 capsule 1   Propylene Glycol 0.6 % SOLN      RESTASIS 0.05 % ophthalmic emulsion 1 drop 2 (two) times daily.     triamcinolone cream (KENALOG) 0.1 % Apply topically as needed.     Zoledronic Acid (RECLAST IV) Inject into the  vein. Last infusion: 12/01/2020     No current facility-administered medications on file prior to visit.   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   Coronary artery disease 2005   Deviated septum 03/2011   Fibromyalgia 2005   Graves disease 2001   Hepatitis C    History of coronary angiogram 05/2012   Hx of LASIK 2000   Hypertension    Optic disc hemorrhage 2005   Osteopenia 2005   Osteoporosis    Pneumonia 1994   Proptosis 2011   Restless leg syndrome 2005   Sarcoidosis 1980   Spondylolisthesis    Strabismus 2013   Stroke [facility])    Allergies  Allergen Reactions   Prednisone     Other reaction(s): Other (See Comments) Pain, bloating    Family History  Problem Relation Age of Onset   Vision loss Mother    Varicose Veins Mother    Heart disease Father    Stroke Father    Vision loss Father    Heart disease Brother    Vision loss Brother    Vision loss Sister     Social History   Socioeconomic History   Marital status: Divorced    Spouse name: Not on file   Number of children: 3   Years of education: Not on file   Highest education level: Not on file  Occupational History   Not on file  Tobacco Use   Smoking status: Never    Passive exposure: Past   Smokeless tobacco: Never  Vaping Use   Vaping Use: Never used  Substance and Sexual Activity   Alcohol use: Not Currently    Alcohol/week: 2.0 standard drinks    Types: 1 Glasses of wine, 1 Cans of beer per week    Comment: rarely   Drug use: Never   Sexual activity: Not Currently  Other Topics Concern   Not on file  Social History Narrative   Lives alone, has dog-Josie.   3 grown children.   2 granddaughters, who live in [place].   Social Determinants of Health   Financial Resource  Strain: Low Risk    Difficulty of Paying Living Expenses: Not hard at all  Food Insecurity: No Food Insecurity   Worried About Charity fundraiser in the Last Year: Never true   [place] in the Last Year: Never true  Transportation Needs: No Transportation Needs   Lack of Transportation (Medical): No   Lack of Transportation (Non-Medical): No  Physical Activity: Sufficiently Active   Days of Exercise per Week: 5 days   Minutes of Exercise per Session: 30 min  Stress: No Stress Concern Present  Feeling of Stress : Not at all  Social Connections: Moderately Isolated   Frequency of Communication with Friends and Family: More than three times a week   Frequency of Social Gatherings with Friends and Family: More than three times a week   Attends Religious Services: Never   Marine scientist or Organizations: No   Attends Music therapist: More than 4 times per year   Marital Status: Divorced   Vitals:   12/07/21 1026  BP: 120/70  Pulse: 71  Resp: 16  Temp: 98.4 F (36.9 C)  SpO2: 93%   Body mass index is 23.27 kg/m.  Physical Exam Vitals and nursing note reviewed.  Constitutional:      General: She is not in acute distress.    Appearance: She is well-developed.  HENT:     Head: Normocephalic and atraumatic.     Mouth/Throat:     Mouth: Mucous membranes are moist.     Pharynx: Oropharynx is clear.  Eyes:     Conjunctiva/sclera: Conjunctivae normal.  Cardiovascular:     Rate and Rhythm: Normal rate and regular rhythm.     Pulses:          Dorsalis pedis pulses are 2+ on the right side and 2+ on the left side.     Heart sounds: No murmur heard. Pulmonary:     Effort: Pulmonary effort is normal. No respiratory distress.     Breath sounds: Normal breath sounds.  Abdominal:     Palpations: Abdomen is soft. There is no hepatomegaly or mass.     Tenderness: There is no abdominal tenderness.  Lymphadenopathy:     Cervical: No cervical adenopathy.   Skin:    General: Skin is warm.     Findings: No erythema or rash.       Neurological:     General: No focal deficit present.     Mental Status: She is alert and oriented to person, place, and time.     Cranial Nerves: No cranial nerve deficit.     Gait: Gait normal.  Psychiatric:     Comments: Well groomed, good eye contact.   ASSESSMENT AND PLAN:  Ms.Jayden was seen today for establish care.  Diagnoses and all orders for this visit: Orders Placed This Encounter  Procedures   Basic metabolic panel   Lab Results  Component Value Date   CREATININE 0.74 12/07/2021   BUN 20 12/07/2021   NA 130 (L) 12/07/2021   K 4.2 12/07/2021   CL 91 (L) 12/07/2021   CO2 31 12/07/2021   Delayed surgical wound healing, subsequent encounter S/P SCC left calf excision 09/30/21. Treated for cellulitis 11/03/21. Healing slowly. She has mupirocin oint at home, recommend applying it bid on affected area. Keep it clean with soap and water. Cover wound when going to bed and uncovered during the day.  Essential hypertension BP adequately controlled. Continue HCTZ 25 mg daily and Metoprolol succinate 25 mg daily. Some side effects of medications discussed. Continue low salt diet.  Hyponatremia It has been otherwise stable. We discussed possible etiologies. She acknowledges she drinks "a lot of water", so decrease intake some. BMP checked today, will consider decreasing/stopping HCTZ if Na++ is not improved.  Post-surgical hypothyroidism Problem has been well controlled. Continue Levothyroxine 88 mcg daily.  Fibromyalgia Problem is stable overall. Continue Lyrica 50 mg bid, she understands will be continued 30 days supply. Low impact physical activity and good sleep hygiene to continue.  I spent a  total of 40 minutes in both face to face and non face to face activities for this visit on the date of this encounter. During this time history was obtained and documented, examination was  performed, prior labs/imaging reviewed, and assessment/plan discussed.  Return in about 6 months (around 06/09/2022).  Renae Mottley G. Martin[place], MD  [facility]. [place] office.

## 2021-12-07 ENCOUNTER — Encounter: Payer: Self-pay | Admitting: Family Medicine

## 2021-12-07 ENCOUNTER — Ambulatory Visit (INDEPENDENT_AMBULATORY_CARE_PROVIDER_SITE_OTHER): Payer: Medicare Other | Admitting: Family Medicine

## 2021-12-07 VITALS — BP 120/70 | HR 71 | Temp 98.4°F | Resp 16 | Ht 62.0 in | Wt 127.2 lb

## 2021-12-07 DIAGNOSIS — T8189XD Other complications of procedures, not elsewhere classified, subsequent encounter: Secondary | ICD-10-CM

## 2021-12-07 DIAGNOSIS — E871 Hypo-osmolality and hyponatremia: Secondary | ICD-10-CM

## 2021-12-07 DIAGNOSIS — I10 Essential (primary) hypertension: Secondary | ICD-10-CM | POA: Diagnosis not present

## 2021-12-07 DIAGNOSIS — E890 Postprocedural hypothyroidism: Secondary | ICD-10-CM

## 2021-12-07 DIAGNOSIS — M797 Fibromyalgia: Secondary | ICD-10-CM | POA: Diagnosis not present

## 2021-12-07 LAB — BASIC METABOLIC PANEL
BUN: 20 mg/dL (ref 6–23)
CO2: 31 mEq/L (ref 19–32)
Calcium: 9.6 mg/dL (ref 8.4–10.5)
Chloride: 91 mEq/L — ABNORMAL LOW (ref 96–112)
Creatinine, Ser: 0.74 mg/dL (ref 0.40–1.20)
GFR: 78.01 mL/min (ref >60.00)
Glucose, Bld: 95 mg/dL (ref 70–99)
Potassium: 4.2 mEq/L (ref 3.5–5.1)
Sodium: 130 mEq/L — ABNORMAL LOW (ref 135–145)

## 2021-12-07 NOTE — Patient Instructions (Addendum)
A few things to remember from today's visit: ? ?Fibromyalgia ? ?Essential hypertension - Plan: Basic metabolic panel ? ?Hyponatremia - Plan: Basic metabolic panel ? ?If you need refills please call your pharmacy. ?Do not use My Chart to request refills or for acute issues that need immediate attention. ?  ?Please be sure medication list is accurate. ?If a new problem present, please set up appointment sooner than planned today. ? ?If sodium is still low we will need to stop hydrochlorothiazide. ?No changes in rest of your meds. ?Let me know about new pharmacy to send Lyrica when you are due, I prescribed 30 days at the time with refills. ?I will see you back in 6 months, we can do fasting labs at that time. ? ? ? ? ? ?

## 2021-12-07 NOTE — Assessment & Plan Note (Addendum)
Problem is stable overall. Continue Lyrica 50 mg bid, she understands will be continued 30 days supply. Low impact physical activity and good sleep hygiene to continue.

## 2021-12-13 ENCOUNTER — Other Ambulatory Visit: Payer: Self-pay

## 2021-12-13 MED ORDER — HYDROCHLOROTHIAZIDE 25 MG PO TABS: ORAL_TABLET | ORAL | 3 refills | Status: DC

## 2021-12-27 ENCOUNTER — Encounter: Payer: Self-pay | Admitting: Family Medicine

## 2021-12-27 ENCOUNTER — Telehealth (INDEPENDENT_AMBULATORY_CARE_PROVIDER_SITE_OTHER): Payer: Medicare Other | Admitting: Family Medicine

## 2021-12-27 VITALS — Ht 62.0 in

## 2021-12-27 DIAGNOSIS — I2510 Atherosclerotic heart disease of native coronary artery without angina pectoris: Secondary | ICD-10-CM | POA: Diagnosis not present

## 2021-12-27 DIAGNOSIS — I10 Essential (primary) hypertension: Secondary | ICD-10-CM | POA: Diagnosis not present

## 2021-12-27 NOTE — Progress Notes (Signed)
Virtual Visit via Telephone Note I connected with Brooke Sanders on 12/27/21 at  4:00 PM EDT by telephone and verified that I am speaking with the correct person using two identifiers.   I discussed the limitations, risks, security and privacy concerns of performing an evaluation and management service by telephone and the availability of in person appointments. I also discussed with the patient that there may be a patient responsible charge related to this service. The patient expressed understanding and agreed to proceed.  Location patient: home Location provider: work office Participants present for the call: patient, provider Patient did not have a visit in the prior 7 days to address this/these issue(s).  Chief Complaint  Patient presents with   Referral    cardiology   History of Present Illness: Brooke Sanders is a 76 y.o.female with hx of hypertension,sarcoidosis,fibromyalgia, hypothyroidism, hyperlipidemia, and CAD requesting referral to establish with new cardiologist. She has been seeing Dr.Rohrbeck, Last visit on 06/07/2021, it was recommended to continue following annually. She is not having any CP, dyspnea, orthopnea, PND, palpitations, or swelling. She would like an appointment with Dr. Buford Dresser, because she is closer to her home. According to pt, she tried to arrange an appt and she was told she needed a referral for her PCP. She is on pravastatin 10 mg daily. HTN on metoprolol succinate 25 mg daily and hydrochlorothiazide 25 mg daily.  Lab Results  Component Value Date   CHOL 173 04/26/2021   HDL 77.70 04/26/2021   LDLCALC 77 04/26/2021   TRIG 91.0 04/26/2021   CHOLHDL 2 04/26/2021   Observations/Objective: Patient sounds cheerful and well on the phone. I do not appreciate any SOB. Speech and thought processing are grossly intact. Patient reported vitals:Ht '5\' 2"'$  (1.575 m)   BMI 23.27 kg/m   Assessment and Plan:  1. Coronary artery  disease involving native coronary artery of native heart without angina pectoris Asymptomatic. She will establish with a cardiologist closer to home, referral placed as requested. Continue pravastatin 10 mg daily and metoprolol succinate 25 mg daily.  - Ambulatory referral to Cardiology  2. Essential hypertension Problem has been well controlled. Continue metoprolol succinate 25 mg daily HCTZ 25 mg daily.  Follow Up Instructions:  Return if symptoms worsen or fail to improve, for Keep next f/u appt.  I did not refer this patient for an OV in the next 24 hours for this/these issue(s).  I discussed the assessment and treatment plan with the patient. The patient was provided an opportunity to ask questions and all were answered. The patient agreed with the plan and demonstrated an understanding of the instructions.   The patient was advised to call back or seek an in-person evaluation if the symptoms worsen or if the condition fails to improve as anticipated.  I provided 11 minutes of non-face-to-face time during this encounter.  Kyann Heydt G. [place], MD [facility]. [place] office.

## 2022-01-09 ENCOUNTER — Ambulatory Visit (INDEPENDENT_AMBULATORY_CARE_PROVIDER_SITE_OTHER): Payer: Medicare Other | Admitting: Cardiology

## 2022-01-09 ENCOUNTER — Encounter (HOSPITAL_BASED_OUTPATIENT_CLINIC_OR_DEPARTMENT_OTHER): Payer: Self-pay | Admitting: Cardiology

## 2022-01-09 VITALS — BP 128/68 | HR 69 | Ht 62.0 in | Wt 126.2 lb

## 2022-01-09 DIAGNOSIS — E7800 Pure hypercholesterolemia, unspecified: Secondary | ICD-10-CM | POA: Diagnosis not present

## 2022-01-09 DIAGNOSIS — I10 Essential (primary) hypertension: Secondary | ICD-10-CM

## 2022-01-09 DIAGNOSIS — I2510 Atherosclerotic heart disease of native coronary artery without angina pectoris: Secondary | ICD-10-CM

## 2022-01-09 DIAGNOSIS — Z8673 Personal history of transient ischemic attack (TIA), and cerebral infarction without residual deficits: Secondary | ICD-10-CM

## 2022-01-09 DIAGNOSIS — Z7189 Other specified counseling: Secondary | ICD-10-CM

## 2022-01-09 MED ORDER — PRAVASTATIN SODIUM 10 MG PO TABS: 10.0000 mg | ORAL_TABLET | Freq: Every day | ORAL | 3 refills | Status: DC

## 2022-01-09 NOTE — Progress Notes (Signed)
Cardiology Office Note:    Date:  01/11/2022   ID:  Dalene Seltzer A. Tianah, Lonardo Sep 16, 1945, MRN [id number]  PCP:  Martinique, Betty G, MD  Cardiologist:  Buford Dresser, MD  Referring MD: Martinique, Betty G, MD   CC: new patient evaluation for history of CAD  History of Present Illness:    Brooke Sanders is a 76 y.o. female with a hx of CAD, sarcoidosis, stroke, hypertension, hyperlipidemia, hypothyroidism, pneumonia, bronchitis, arthritis, Graves disease, fibromyalgia, hepatitis C, optic disc hemorrhage, and restless leg syndrome, who is seen as a new consult at the request of Martinique, Malka So, MD for the evaluation and management of CAD.  She saw Dr. Martinique by video visit on  12/27/2021 and requested a referral to establish with a new cardiologist. She was previously followed by Dr. Marijo File, last visit on 06/07/2021, where she was generally doing well from a cardiac standpoint.  Cardiovascular risk factors: Prior clinical ASCVD: Prior stroke around 2001, recovered quickly. Comorbid conditions:  Hypertension- Controlled on metoprolol succinate 25 mg daily and HCTZ 25 mg daily. Hyperlipidemia- On Pravastatin 5 mg daily for a long time.  Hypothyroidism. Metabolic syndrome/Obesity: BMI 23 Chronic inflammatory conditions: Since 1980 sarcoidosis. Fibromyalgia. Arthritis. Mononucleosis in her youth. She sees a rheumatologist, Dr. Estanislado Pandy. With barometric pressure changes, she develops swelling, particularly periorbital, which affects her vision. Tobacco use history: never Family history: Her brother has had heart attacks and CABG x2 initially around 78 yo, currently 76 yo. She reports Dementia and Alzheimer's in her family. Prior cardiac testing and/or incidental findings on other testing (ie coronary calcium): CT 2013 showed evidence of CAD, Confirms having a prior Echo and Stress test. Exercise level: Regularly walks 1.5 miles in the morning and evening with her dog. Stretches and some  weight lifting. She denies ever having any form of chest pain or discomfort. The only time she notices shortness of breath is with walking quickly up a hill. Current diet: Monitors her diet. Frequently enjoys green tea, fruit, nuts. Currently using multiple supplements including apple cider vinegar, B and D vitamins.  She denies any palpitations, lightheadedness, headaches, syncope, orthopnea, or PND.  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 2020-2021   cystic ectopic ovary  [place]   orbital decompression Bilateral 03/2011   ossified ganglion surgery Right 1974   SECONDARY INTRAOCULAR LENSE IMPLANTATION Right 05/2012  SPINE SURGERY     TOTAL THYROIDECTOMY  2011   TUMOR REMOVAL Left 10/06/2021   left leg    Current Medications: Current Outpatient Medications on File Prior to Visit  Medication Sig   APPLE CIDER VINEGAR PO Take by mouth daily.   aspirin 81 MG EC tablet Take 1 tablet by mouth daily.   b complex vitamins tablet Take by mouth.   Calcium-Magnesium-Vitamin D (CITRACAL SLOW RELEASE PO) Take by  mouth.   FLOVENT HFA 110 MCG/ACT inhaler Inhale 1 puff into the lungs 2 (two) times daily.   fluticasone (FLONASE) 50 MCG/ACT nasal spray Place into the nose as needed.   hydrochlorothiazide (HYDRODIURIL) 25 MG tablet Take 1/2 tablet by mouth daily.   levothyroxine (SYNTHROID) 88 MCG tablet TAKE 1 TABLET BY MOUTH DAILY AT 6 AM   metoprolol succinate (TOPROL-XL) 25 MG 24 hr tablet Take 1 tablet (25 mg total) by mouth daily.   montelukast (SINGULAIR) 10 MG tablet Take 1 tablet (10 mg total) by mouth at bedtime.   Multiple Vitamins-Minerals (ALIVE WOMENS ENERGY) TABS Take by mouth.    Polyethyl Glycol-Propyl Glycol (SYSTANE OP) Apply to eye.   pregabalin (LYRICA) 50 MG capsule Take 1 capsule (50 mg total) by mouth 2 (two) times daily.   Propylene Glycol 0.6 % SOLN    RESTASIS 0.05 % ophthalmic emulsion 1 drop 2 (two) times daily.   triamcinolone cream (KENALOG) 0.1 % Apply topically as needed.   Zoledronic Acid (RECLAST IV) Inject into the vein. Last infusion: 12/01/2020   No current facility-administered medications on file prior to visit.     Allergies:   Prednisone   Social History   Tobacco Use   Smoking status: Never    Passive exposure: Past   Smokeless tobacco: Never  Vaping Use   Vaping Use: Never used  Substance Use Topics   Alcohol use: Not Currently    Alcohol/week: 2.0 standard drinks of alcohol    Types: 1 Glasses of wine, 1 Cans of beer per week    Comment: rarely   Drug use: Never    Family History: family history includes Heart disease in her brother and father; Stroke in her father; Varicose Veins in her mother; Vision loss in her brother, father, mother, and sister.  ROS:   Please see the history of present illness.  Additional pertinent ROS: Constitutional: Negative for chills, fever, night sweats, unintentional weight loss  HENT: Negative for ear pain and hearing loss.   Eyes: Negative for loss of vision and eye pain.  Respiratory: Negative for cough,  sputum, wheezing.   Cardiovascular: See HPI. Gastrointestinal: Negative for abdominal pain, melena, and hematochezia.  Genitourinary: Negative for dysuria and hematuria.  Musculoskeletal: Negative for falls and myalgias.  Skin: Negative for itching and rash.  Neurological: Negative for focal weakness, focal sensory changes and loss of consciousness.  Endo/Heme/Allergies: Does not bruise/bleed easily.     EKGs/Labs/Other Studies Reviewed:    The following studies were reviewed today:  No prior cardiovascular studies available.   EKG:  EKG is personally reviewed.   01/09/2022:  NSR, 69 bpm  Recent Labs: 04/26/2021: TSH 0.51 11/03/2021: ALT 16; Hemoglobin 12.6; Platelets 232 12/07/2021: BUN 20; Creatinine, Ser 0.74; Potassium 4.2; Sodium 130   Recent Lipid Panel    Component Value Date/Time   CHOL 173 04/26/2021 0940   TRIG 91.0 04/26/2021 0940   HDL 77.70 04/26/2021 0940   CHOLHDL 2 04/26/2021 0940   VLDL 18.2 04/26/2021 0940   LDLCALC  77 04/26/2021 0940    Physical Exam:    VS:  BP 128/68 (BP Location: Right Arm, Patient Position: Sitting, Cuff Size: Normal)   Pulse 69   Ht '5\' 2"'$  (1.575 m)   Wt 126 lb 3.2 oz (57.2 kg)   BMI 23.08 kg/m     Wt Readings from Last 3 Encounters:  01/09/22 126 lb 3.2 oz (57.2 kg)  12/07/21 127 lb 4 oz (57.7 kg)  11/16/21 125 lb 12.8 oz (57.1 kg)    GEN: Well nourished, well developed in no acute distress HEENT: Normal, moist mucous membranes NECK: No JVD CARDIAC: regular rhythm, normal S1 and S2, no rubs or gallops. No murmur. VASCULAR: Radial and DP pulses 2+ bilaterally. No carotid bruits RESPIRATORY:  Clear to auscultation without rales, wheezing or rhonchi  ABDOMEN: Soft, non-tender, non-distended MUSCULOSKELETAL:  Ambulates independently SKIN: Warm and dry, no edema NEUROLOGIC:  Alert and oriented x 3. No focal neuro deficits noted. PSYCHIATRIC:  Normal affect    ASSESSMENT:    1. Coronary artery disease involving native  coronary artery of native heart without angina pectoris   2. Essential hypertension   3. Pure hypercholesterolemia   4. History of CVA (cerebrovascular accident) without residual deficits   5. Cardiac risk counseling   6. Counseling on health promotion and disease prevention    PLAN:    CAD History of CVA Hypercholesterolemia -no history of MI, noted plaque found on prior CT imaging -reports prior stress test/echo were normal, I cannot see results -on pravastatin, last LDL 77. Discussed goal of <70. She is sensitive to medications; we discussed changing to high intensity statin vs increasing the pravastatin. Will start with increasing pravastatin, recheck LDL at her follow up this fall -reviewed red flag warning signs that need immediate medical attention -continue aspirin -reviewed recent [facility] study re: supplements and CV outcomes, included link in her AVS  Hypertension: at goal of <130/80 -continue HCTZ  Cardiac risk counseling and prevention recommendations: -recommend heart healthy/Mediterranean diet, with whole grains, fruits, vegetable, fish, lean meats, nuts, and olive oil. Limit salt. -recommend moderate walking, 3-5 times/week for 30-50 minutes each session. Aim for at least 150 minutes.week. Goal should be pace of 3 miles/hours, or walking 1.5 miles in 30 minutes -recommend avoidance of tobacco products. Avoid excess alcohol.  Plan for follow up: 1 year or sooner as needed.  Buford Dresser, MD, PhD, [facility]    Medication Adjustments/Labs and Tests Ordered: Current medicines are reviewed at length with the patient today.  Concerns regarding medicines are outlined above.   Orders Placed This Encounter  Procedures   EKG 12-Lead   Meds ordered this encounter  Medications   pravastatin (PRAVACHOL) 10 MG tablet    Sig: Take 1 tablet (10 mg total) by mouth daily.    Dispense:  90 tablet    Refill:  3   Patient Instructions  Medication  Instructions:  INCREASE Pravastatin to 10 mg (1 tablet) daily  *If you need a refill on your cardiac medications before your next appointment, please call your pharmacy*   Lab Work: None ordered today   Testing/Procedures: None ordered today   Follow-Up: At [facility], you and your health needs are our priority.  As part of our continuing mission to provide you with exceptional heart care, we have created designated Provider Care Teams.  These Care Teams include your primary Cardiologist (physician) and Advanced Practice Providers (APPs -  Physician Assistants and Nurse Practitioners) who  all work together to provide you with the care you need, when you need it.  We recommend signing up for the patient portal called "MyChart".  Sign up information is provided on this After Visit Summary.  MyChart is used to connect with patients for Virtual Visits (Telemedicine).  Patients are able to view lab/test results, encounter notes, upcoming appointments, etc.  Non-urgent messages can be sent to your provider as well.   To learn more about what you can do with MyChart, go to [email or web address].    Your next appointment:   1 year(s)  The format for your next appointment:   In Person  Provider:   Buford Dresser, MD{   Recent data, summarized from [facility] from a study from the [facility]:  https://wilkins.info/  "Researchers at the [facility] set out to answer this question by comparing statins to supplements in a clinical trial. They tracked the outcomes of 190 adults, ages 93 to 49. Some participants were given a 5 mg daily dose of rosuvastatin, a statin that is sold under the brand name Crestor for 28 days. Others were given supplements, including fish oil, cinnamon, garlic, turmeric, plant sterols or red yeast rice for the same period."  "What  we found was that rosuvastatin lowered LDL cholesterol by almost 38% and that was vastly superior to placebo and any of the six supplements studied in the trial," study Despina Hidden, M.D. of the [facility], [place] told NPR. He says this level of reduction is enough to lower the risk of heart attacks and strokes. The findings are published in the Journal of the [employer] of Cardiology.  "Oftentimes these supplements are marketed as 'natural ways' to lower your cholesterol," says Laffin. But he says none of the dietary supplements demonstrated any significant decrease in LDL cholesterol compared with a placebo. LDL cholesterol is considered the 'bad cholesterol' because it can contribute to plaque build-up in the artery walls - which can narrow the arteries, and set the stage for heart attacks and strokes"    I,Mathew Stumpf,acting as a scribe for [employer], MD.,have documented all relevant documentation on the behalf of Buford Dresser, MD,as directed by  Buford Dresser, MD while in the presence of Buford Dresser, MD.  I, Buford Dresser, MD, have reviewed all documentation for this visit. The documentation on 01/11/22 for the exam, diagnosis, procedures, and orders are all accurate and complete.   Signed, Buford Dresser, MD PhD 01/11/2022 10:50 AM    [place]

## 2022-01-09 NOTE — Patient Instructions (Signed)
Medication Instructions:  INCREASE Pravastatin to 10 mg (1 tablet) daily  *If you need a refill on your cardiac medications before your next appointment, please call your pharmacy*   Lab Work: None ordered today   Testing/Procedures: None ordered today   Follow-Up: At [facility], you and your health needs are our priority.  As part of our continuing mission to provide you with exceptional heart care, we have created designated Provider Care Teams.  These Care Teams include your primary Cardiologist (physician) and Advanced Practice Providers (APPs -  Physician Assistants and Nurse Practitioners) who all work together to provide you with the care you need, when you need it.  We recommend signing up for the patient portal called "MyChart".  Sign up information is provided on this After Visit Summary.  MyChart is used to connect with patients for Virtual Visits (Telemedicine).  Patients are able to view lab/test results, encounter notes, upcoming appointments, etc.  Non-urgent messages can be sent to your provider as well.   To learn more about what you can do with MyChart, go to [email or web address].    Your next appointment:   1 year(s)  The format for your next appointment:   In Person  Provider:   Buford Dresser, MD{   Recent data, summarized from [facility] from a study from the [facility]:  https://wilkins.info/  "Researchers at the [facility] set out to answer this question by comparing statins to supplements in a clinical trial. They tracked the outcomes of 190 adults, ages 24 to 10. Some participants were given a 5 mg daily dose of rosuvastatin, a statin that is sold under the brand name Crestor for 28 days. Others were given supplements, including fish oil, cinnamon, garlic, turmeric, plant sterols or red yeast rice for the same  period."  "What we found was that rosuvastatin lowered LDL cholesterol by almost 38% and that was vastly superior to placebo and any of the six supplements studied in the trial," study Despina Hidden, M.D. of the [facility], [place] told NPR. He says this level of reduction is enough to lower the risk of heart attacks and strokes. The findings are published in the Journal of the [employer] of Cardiology.  "Oftentimes these supplements are marketed as 'natural ways' to lower your cholesterol," says Laffin. But he says none of the dietary supplements demonstrated any significant decrease in LDL cholesterol compared with a placebo. LDL cholesterol is considered the 'bad cholesterol' because it can contribute to plaque build-up in the artery walls - which can narrow the arteries, and set the stage for heart attacks and strokes"

## 2022-01-10 ENCOUNTER — Other Ambulatory Visit: Payer: Self-pay | Admitting: Family Medicine

## 2022-01-11 ENCOUNTER — Encounter (HOSPITAL_BASED_OUTPATIENT_CLINIC_OR_DEPARTMENT_OTHER): Payer: Self-pay | Admitting: Cardiology

## 2022-01-11 DIAGNOSIS — E7800 Pure hypercholesterolemia, unspecified: Secondary | ICD-10-CM | POA: Insufficient documentation

## 2022-01-11 DIAGNOSIS — Z8673 Personal history of transient ischemic attack (TIA), and cerebral infarction without residual deficits: Secondary | ICD-10-CM | POA: Insufficient documentation

## 2022-03-28 ENCOUNTER — Other Ambulatory Visit: Payer: Self-pay | Admitting: Family

## 2022-04-07 ENCOUNTER — Other Ambulatory Visit: Payer: Self-pay | Admitting: Family

## 2022-04-12 ENCOUNTER — Other Ambulatory Visit: Payer: Self-pay | Admitting: Family Medicine

## 2022-04-19 NOTE — Progress Notes (Signed)
Office Visit Note  Patient: Brooke Sanders             Date of Birth: Feb 27, 1946           MRN: [id number]             PCP: Martinique, Betty G, MD Referring: Marrian Salvage,* Visit Date: 05/03/2022 Occupation: '@GUAROCC'$ @  Subjective:  Medication management  History of Present Illness: Brooke Sanders is a 76 y.o. female with history of sarcoidosis, osteoarthritis, degenerative disc disease and osteoporosis.  She denies any increased joint pain or joint swelling.  She continues to have pain and discomfort in her bilateral shoulders, bilateral hands, bilateral knee joints.  She continues to have pain and stiffness in her cervical or lumbar region.  She denies any episodes of pseudogout.  She states she has been taking neutrophile for hair loss which has been helpful.  She denies any shortness of breath or lymphadenopathy.  She has been taking calcium and vitamin D.  She walks on a regular basis.  Activities of Daily Living:  Patient reports morning stiffness for a few minutes.   Patient Reports nocturnal pain.  Difficulty dressing/grooming: Denies Difficulty climbing stairs: Denies Difficulty getting out of chair: Denies Difficulty using hands for taps, buttons, cutlery, and/or writing: Denies  Review of Systems  Constitutional:  Positive for fatigue.  HENT:  Negative for mouth sores and mouth dryness.   Eyes:  Negative for dryness.  Respiratory:  Negative for shortness of breath.   Cardiovascular:  Negative for chest pain and palpitations.  Gastrointestinal:  Negative for blood in stool, constipation and diarrhea.  Endocrine: Negative for increased urination.  Genitourinary:  Negative for involuntary urination.  Musculoskeletal:  Positive for joint pain, joint pain, joint swelling, myalgias, muscle weakness, morning stiffness, muscle tenderness and myalgias. Negative for gait problem.  Skin:  Positive for color change. Negative for rash, hair loss and sensitivity to  sunlight.  Allergic/Immunologic: Negative for susceptible to infections.  Neurological:  Positive for headaches. Negative for dizziness.  Hematological:  Negative for swollen glands.  Psychiatric/Behavioral:  Positive for sleep disturbance. Negative for depressed mood. The patient is not nervous/anxious.     PMFS History:  Patient Active Problem List   Diagnosis Date Noted   History of CVA (cerebrovascular accident) without residual deficits 01/11/2022   Pure hypercholesterolemia 01/11/2022   Chronic cough 02/21/2021   DDD (degenerative disc disease), cervical 11/11/2020   Chondrocalcinosis 11/11/2020   Condyloma 02/11/2020   History of colonic polyps 09/19/2019   Ganglion cyst of volar aspect of right wrist 07/04/2018   Sarcoidosis 06/24/2018   Primary osteoarthritis of both hands 06/24/2018   Primary osteoarthritis of both knees 06/24/2018   DDD (degenerative disc disease), lumbar 06/24/2018   Fibromyalgia 06/24/2018   Age-related osteoporosis without current pathological fracture 06/24/2018   Essential hypertension 06/24/2018   Dyslipidemia 06/24/2018   History of coronary artery disease 06/24/2018   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   Allergy to pollen 06/06/2018   Coronary artery disease involving native coronary artery of native heart without angina pectoris 04/23/2017    Past Medical History:  Diagnosis Date   Allergy 2009   Arthritis    Bilateral artificial lens implant 2009   Bronchitis 1994   Cancer ([place]) 2023 Squamous   Cataract    Cervical herniated disc 1986   Chronic fatigue 2005   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   Coronary artery disease 2005   Deviated septum 03/2011   Fibromyalgia 2005  Graves disease 2001   Hepatitis C    History of coronary angiogram 05/2012   Hx of LASIK 2000   Hypertension    Optic disc hemorrhage 2005   Osteopenia 2005   Osteoporosis    Pneumonia 1994   Proptosis 2011   Restless leg syndrome 2005   Sarcoidosis  1980   Spondylolisthesis    Strabismus 2013   Stroke [facility])     Family History  Problem Relation Age of Onset   Vision loss Mother    Varicose Veins Mother    Heart disease Father    Stroke Father    Vision loss Father    Heart disease Brother    Vision loss Brother    Vision loss Sister    Past Surgical History:  Procedure Laterality Date   ABDOMINAL HYSTERECTOMY  2000   CARPAL TUNNEL RELEASE Right 2009   [place]   COLON SURGERY  2021   COLONOSCOPY     x3 in 6 months. 2020-2021   cystic ectopic ovary  [place]   orbital decompression Bilateral 03/2011   ossified ganglion surgery Right 1974   SECONDARY INTRAOCULAR LENSE IMPLANTATION Right 05/2012   SPINE SURGERY     TOTAL THYROIDECTOMY  2011   TUMOR REMOVAL Left 10/06/2021   left leg   Social History   Social History Narrative   Lives alone, has dog-Josie.   3 grown children.   2 granddaughters, who live in [place].   Immunization History  Administered Date(s) Administered   Fluad Quad(high Dose 65+) 04/26/2021, 04/06/2022   Influenza-Unspecified 03/14/2019   PFIZER(Purple Top)SARS-COV-2 Vaccination 08/12/2019, 09/01/2019, 04/02/2020, 10/22/2020   Pneumococcal Conjugate-13 07/24/2014   Pneumococcal Polysaccharide-23 03/06/2018   Rsv, Bivalent, Protein Subunit Rsvpref,pf [place]) 04/06/2022   Tdap 01/24/2019   Zoster Recombinat (Shingrix) 05/29/2018, 07/30/2018, 01/24/2019     Objective: Vital Signs: BP (!) 151/73 (BP Location: Left Arm, Patient Position: Sitting, Cuff Size: Normal)   Pulse 65   Resp 16   Ht '5\' 2"'$  (1.575 m)   Wt 125 lb 6.4 oz (56.9 kg)   BMI 22.94 kg/m    Physical Exam   Musculoskeletal Exam: She had good range of motion of the cervical spine.  She had good mobility in her thoracic and lumbar region.  She has some discomfort range of motion of the lumbar spine.  Shoulder joints were in good range of motion without any  discomfort.  She has tenderness over biceps tendons.  Elbow joints, wrist joints, MCPs PIPs and DIPs been good range of motion.  She had bilateral PIP and DIP thickening with no synovitis.  Hip joints and knee joints were in good range of motion without any warmth swelling or effusion.  She had no tenderness over ankles or MTPs.  CDAI Exam: CDAI Score: -- Patient Global: --; Provider Global: -- Swollen: --; Tender: -- Joint Exam 05/03/2022   No joint exam has been documented for this visit   There is currently no information documented on the homunculus. Go to the Rheumatology activity and complete the homunculus joint exam.  Investigation: No additional findings.  Imaging: No results found.  Recent Labs: Lab Results  Component Value Date   WBC 6.8 11/03/2021   HGB 12.6 11/03/2021   PLT 232 11/03/2021   NA 130 (L) 12/07/2021   K 4.2 12/07/2021   CL 91 (L) 12/07/2021   CO2 31  12/07/2021   GLUCOSE 95 12/07/2021   BUN 20 12/07/2021   CREATININE 0.74 12/07/2021   BILITOT 0.6 11/03/2021   ALKPHOS 41 11/03/2021   AST 24 11/03/2021   ALT 16 11/03/2021   PROT 7.6 11/03/2021   ALBUMIN 5.0 11/03/2021   CALCIUM 9.6 12/07/2021   GFRAA 98 11/11/2020    Speciality Comments: Of Reclast since 2015.  Reclast infusion Dec 01, 2020  Procedures:  No procedures performed Allergies: Prednisone   Assessment / Plan:     Visit Diagnoses: Sarcoidosis -she denies any shortness of breath or joint inflammation.  She was diagnosed in 1980 after positive cervical lymph node biopsy.ACE normal.  She had no recurrence of sarcoidosis since then.  Primary osteoarthritis of both hands -she continues to have some pain and stiffness in her hands.  Bilateral PIP and DIP thickening was noted.  Clinical and radiographic findings are consistent with osteoarthritis.  Joint protection muscle strengthening was discussed.  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  She continues to  have discomfort in her knee joints.  No warmth swelling or effusion was noted.  Lower extremity muscle strengthening exercises were discussed.  DDD (degenerative disc disease), cervical-she has stiffness with range of motion of the cervical spine.  She had good mobility in her cervical spine.  She has some bilateral trapezius spasm.  DDD (degenerative disc disease), lumbar - Status post fusion x2.  She has limited range of motion of the lumbar spine without any discomfort.  Biceps tendinitis of both shoulders-she continues to have some discomfort in her shoulders.  Chondrocalcinosis-there is no true pseudogout.  Hair loss-improved with supplements.  Fibromyalgia -she is on Lyrica 50 mg 1 tablet twice daily for symptomatic relief.  Need for regular exercises and stretching was emphasized.  Other fatigue-related to insomnia and fibromyalgia.  Age-related osteoporosis without current pathological fracture - DEXA 09/20/20 The BMD measured at Femur Neck Left is 0.699 g/cm2 with a T-score of -2.4. Prev therapy includes Reclast x4. 2012, 2013, 2014 then gap until Dec 01, 2020.  We will schedule DEXA scan in March 2024 and will make decision regarding next Reclast infusion.  Patient has been getting labs to her PCP.  She will forward lab results to [place].  Essential hypertension-blood pressure was elevated at 151/73.  Patient states she has been monitoring her blood pressure.  I advised her to follow-up with her PCP regarding elevated blood pressure reading.  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   History of hypothyroidism  Orders: Orders Placed This Encounter  Procedures   DG Bone Density   No orders of the defined types were placed in this encounter.  Follow-Up Instructions: Return in about 6 months (around 11/02/2022) for Sarcoidosis, Osteoarthritis.   Bo Merino, MD  Note - This record has been created using  Editor, commissioning.  Chart creation errors have been sought, but may not always  have been located. Such creation errors do not reflect on  the standard of medical care.

## 2022-05-03 ENCOUNTER — Ambulatory Visit: Payer: Medicare Other | Attending: Rheumatology | Admitting: Rheumatology

## 2022-05-03 ENCOUNTER — Encounter: Payer: Self-pay | Admitting: Rheumatology

## 2022-05-03 VITALS — BP 151/73 | HR 65 | Resp 16 | Ht 62.0 in | Wt 125.4 lb

## 2022-05-03 DIAGNOSIS — C44729 Squamous cell carcinoma of skin of left lower limb, including hip: Secondary | ICD-10-CM | POA: Insufficient documentation

## 2022-05-03 DIAGNOSIS — M5136 Other intervertebral disc degeneration, lumbar region: Secondary | ICD-10-CM | POA: Insufficient documentation

## 2022-05-03 DIAGNOSIS — M797 Fibromyalgia: Secondary | ICD-10-CM | POA: Insufficient documentation

## 2022-05-03 DIAGNOSIS — I10 Essential (primary) hypertension: Secondary | ICD-10-CM | POA: Diagnosis present

## 2022-05-03 DIAGNOSIS — R5383 Other fatigue: Secondary | ICD-10-CM | POA: Insufficient documentation

## 2022-05-03 DIAGNOSIS — M19042 Primary osteoarthritis, left hand: Secondary | ICD-10-CM | POA: Diagnosis present

## 2022-05-03 DIAGNOSIS — M7522 Bicipital tendinitis, left shoulder: Secondary | ICD-10-CM | POA: Insufficient documentation

## 2022-05-03 DIAGNOSIS — M1120 Other chondrocalcinosis, unspecified site: Secondary | ICD-10-CM | POA: Diagnosis present

## 2022-05-03 DIAGNOSIS — M170 Bilateral primary osteoarthritis of knee: Secondary | ICD-10-CM | POA: Diagnosis not present

## 2022-05-03 DIAGNOSIS — E785 Hyperlipidemia, unspecified: Secondary | ICD-10-CM | POA: Insufficient documentation

## 2022-05-03 DIAGNOSIS — M7521 Bicipital tendinitis, right shoulder: Secondary | ICD-10-CM | POA: Diagnosis present

## 2022-05-03 DIAGNOSIS — L659 Nonscarring hair loss, unspecified: Secondary | ICD-10-CM | POA: Diagnosis present

## 2022-05-03 DIAGNOSIS — M810 Age-related osteoporosis without current pathological fracture: Secondary | ICD-10-CM | POA: Diagnosis present

## 2022-05-03 DIAGNOSIS — M5030 Other cervical disc degeneration, unspecified cervical region: Secondary | ICD-10-CM | POA: Diagnosis not present

## 2022-05-03 DIAGNOSIS — I2510 Atherosclerotic heart disease of native coronary artery without angina pectoris: Secondary | ICD-10-CM

## 2022-05-03 DIAGNOSIS — M19041 Primary osteoarthritis, right hand: Secondary | ICD-10-CM | POA: Diagnosis not present

## 2022-05-03 DIAGNOSIS — Z8679 Personal history of other diseases of the circulatory system: Secondary | ICD-10-CM | POA: Diagnosis present

## 2022-05-03 DIAGNOSIS — Z8639 Personal history of other endocrine, nutritional and metabolic disease: Secondary | ICD-10-CM | POA: Diagnosis present

## 2022-05-03 DIAGNOSIS — D869 Sarcoidosis, unspecified: Secondary | ICD-10-CM | POA: Insufficient documentation

## 2022-05-22 ENCOUNTER — Other Ambulatory Visit: Payer: Self-pay | Admitting: Cardiology

## 2022-05-22 NOTE — Telephone Encounter (Signed)
Rx(s) sent to pharmacy electronically.  

## 2022-06-09 NOTE — Progress Notes (Signed)
HPI: [name] is a 76 y.o. female, who is here today to follow on recent visit. She reports no new problems since the last visit in May, except for pre-cancerous skin, which is being treated with a cream and showing signs of healing. She was last seen on 12/07/21.  Hypertension:  Medications:Hydrochlorothiazide 25 mg daily, and metoprolol 25 mg daily. BP readings at home:125/70's. Negative for unusual or severe headache, visual changes, worsening dyspnea,  focal weakness, or edema. SOB when climbing hills, for at least six months.She usually doe snot stop, she keeps walking and problem usually resolves. No associated symptoms. She follows with cardiologist. Sodium mildly low, she has not noted abdominal pain,MS changes,or changes in bowel habits.  Lab Results  Component Value Date   CREATININE 0.74 12/07/2021   BUN 20 12/07/2021   NA 130 (L) 12/07/2021   K 4.2 12/07/2021   CL 91 (L) 12/07/2021   CO2 31 12/07/2021   Hypothyroidism: She is on Levothyroxine 88 mcg daily. Negative for changes in bowel habits or tremor.  Lab Results  Component Value Date   TSH 0.51 04/26/2021   HLD on Pravastatin 10 mg daily. She is on Aspirin 81 mg daily. Last LDL 77 in 04/2021.  Review of Systems  Constitutional:  Negative for activity change, appetite change and fever.  HENT:  Negative for mouth sores, nosebleeds and trouble swallowing.   Respiratory:  Negative for cough and wheezing.   Gastrointestinal:  Negative for abdominal pain, nausea and vomiting.  Genitourinary:  Negative for decreased urine volume and hematuria.  Neurological:  Negative for syncope and facial asymmetry.  See other pertinent positives and negatives in HPI.  Current Outpatient Medications on File Prior to Visit  Medication Sig Dispense Refill   APPLE CIDER VINEGAR PO Take by mouth daily.     aspirin 81 MG EC tablet Take 1 tablet by mouth daily.     b complex vitamins tablet Take by mouth.      Calcium-Magnesium-Vitamin D (CITRACAL SLOW RELEASE PO) Take by mouth.     FLOVENT HFA 110 MCG/ACT inhaler Inhale 1 puff into the lungs 2 (two) times daily.     fluticasone (FLONASE) 50 MCG/ACT nasal spray Place into the nose as needed.     hydrochlorothiazide (HYDRODIURIL) 25 MG tablet Take 1/2 tablet by mouth daily. 90 tablet 3   levothyroxine (SYNTHROID) 88 MCG tablet TAKE 1 TABLET BY MOUTH DAILY AT 6 AM 90 tablet 3   metoprolol succinate (TOPROL-XL) 25 MG 24 hr tablet TAKE 1 TABLET(25 MG) BY MOUTH DAILY 90 tablet 1   montelukast (SINGULAIR) 10 MG tablet Take 1 tablet (10 mg total) by mouth at bedtime. 90 tablet 0   OVER THE COUNTER MEDICATION Nutrafol     Polyethyl Glycol-Propyl Glycol (SYSTANE OP) Apply to eye.     pravastatin (PRAVACHOL) 10 MG tablet Take 1 tablet (10 mg total) by mouth daily. 90 tablet 3   pregabalin (LYRICA) 50 MG capsule TAKE 1 CAPSULE(50 MG) BY MOUTH TWICE DAILY 60 capsule 3   Propylene Glycol 0.6 % SOLN      RESTASIS 0.05 % ophthalmic emulsion 1 drop 2 (two) times daily.     triamcinolone cream (KENALOG) 0.1 % Apply topically as needed.     Zoledronic Acid (RECLAST IV) Inject into the vein. Last infusion: 12/01/2020     No current facility-administered medications on file prior to visit.    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   Coronary artery disease 2005   Deviated septum 03/2011   Fibromyalgia 2005   Graves disease 2001   Hepatitis C    History of coronary angiogram 05/2012   Hx of LASIK 2000   Hypertension    Optic disc hemorrhage 2005   Osteopenia 2005   Osteoporosis    Pneumonia 1994   Proptosis 2011   Restless leg syndrome 2005   Sarcoidosis 1980   Spondylolisthesis    Strabismus 2013   Stroke [facility])    Allergies   Allergen Reactions   Prednisone     Other reaction(s): Other (See Comments) Pain, bloating    Social History   Socioeconomic History   Marital status: Divorced    Spouse name: Not on file   Number of children: 3   Years of education: Not on file   Highest education level: Not on file  Occupational History   Not on file  Tobacco Use   Smoking status: Never    Passive exposure: Past   Smokeless tobacco: Never  Vaping Use   Vaping Use: Never used  Substance and Sexual Activity   Alcohol use: Not Currently    Alcohol/week: 2.0 standard drinks of alcohol    Types: 1 Glasses of wine, 1 Cans of beer per week    Comment: rarely   Drug use: Never   Sexual activity: Not Currently  Other Topics Concern   Not on file  Social History Narrative   Lives alone, has dog-Josie.   3 grown children.   2 granddaughters, who live in [place].   Social Determinants of Health   Financial Resource Strain: Low Risk  (11/03/2021)   Overall Financial Resource Strain (CARDIA)    Difficulty of Paying Living Expenses: Not hard at all  Food Insecurity: No Food Insecurity (11/03/2021)   Hunger Vital Sign    Worried About Running Out of Food in the Last Year: Never true    Ran Out of Food in the Last Year: Never true  Transportation Needs: No Transportation Needs (11/03/2021)   PRAPARE - Hydrologist (Medical): No    Lack of Transportation (Non-Medical): No  Physical Activity: Sufficiently Active (11/03/2021)   Exercise Vital Sign    Days of Exercise per Week: 5 days    Minutes of Exercise per Session: 30 min  Stress: No Stress Concern Present (11/03/2021)   [place]    Feeling of Stress : Not at all  Social Connections: Moderately Isolated (11/03/2021)   Social Connection and Isolation Panel [NHANES]    Frequency of Communication with Friends and Family: More than three times a week    Frequency of Social  Gatherings with Friends and Family: More than three times a week    Attends Religious Services: Never    Marine scientist or Organizations: No    Attends Music therapist: More than 4 times per year    Marital Status: Divorced    Vitals:   06/12/22 0853  BP: 126/70  Pulse: 75  Resp: 16  Temp: (!) 97.5 F (36.4 C)  SpO2: 99%   Body mass index is 22.57 kg/m.  Physical Exam Vitals and nursing note reviewed.  Constitutional:      General: She is not in acute distress.    Appearance: She is well-developed.  HENT:     Head: Normocephalic and atraumatic.     Mouth/Throat:     Mouth: Mucous membranes are moist.     Pharynx: Oropharynx is clear.  Eyes:     Conjunctiva/sclera: Conjunctivae normal.  Cardiovascular:     Rate and Rhythm: Normal rate and regular rhythm.     Pulses:          Dorsalis pedis pulses are 2+ on the right side and 2+ on the left side.     Heart sounds: No murmur heard. Pulmonary:     Effort: Pulmonary effort is normal. No respiratory distress.     Breath sounds: Normal breath sounds.  Abdominal:     Palpations: Abdomen is soft. There is no hepatomegaly or mass.     Tenderness: There is no abdominal tenderness.  Lymphadenopathy:     Cervical: No cervical adenopathy.  Skin:    General: Skin is warm.     Findings: No erythema or rash.  Neurological:     General: No focal deficit present.     Mental Status: She is alert and oriented to person, place, and time.     Cranial Nerves: No cranial nerve deficit.     Gait: Gait normal.  Psychiatric:     Comments: Well groomed, good eye contact.   ASSESSMENT AND PLAN:  [name] was seen today for follow-up.  Diagnoses and all orders for this visit:  Orders Placed This Encounter  Procedures   Comprehensive metabolic panel   Lipid panel   TSH   Lab Results  Component Value Date   TSH 0.38 06/12/2022   Lab Results  Component Value Date   CREATININE 0.81 06/12/2022   BUN 11  06/12/2022   NA 133 (L) 06/12/2022   K 4.5 06/12/2022   CL 95 (L) 06/12/2022   CO2 29 06/12/2022   Lab Results  Component Value Date   ALT 14 06/12/2022   AST 24 06/12/2022   ALKPHOS 41 06/12/2022   BILITOT 0.7 06/12/2022   Lab Results  Component Value Date   CHOL 148 06/12/2022   HDL 74.90 06/12/2022   LDLCALC 56 06/12/2022   TRIG 87.0 06/12/2022   CHOLHDL 2 06/12/2022   Short of breath on exertion  No associated symptoms. Possible causes discussed. Reports her sarcoidosis as in remission. Problem has been stable. Instructed about warning signs. Instructed to address problem with her cardiologist, may need further work up.  Essential hypertension BP adequately controlled. Continue current management: HCTZ 25 mg 12.5 mg daily and Metoprolol succinate 25 mg daily. DASH/low salt diet to continue. Continue monitoring BP at home. Eye exam is current.  Post-surgical hypothyroidism Problem has been well controlled. Last TSH 0.5 in 04/2021. No changes in Levothyroxine dose, it will be adjusted if needed according to TSH result.  Dyslipidemia LDL 77 in 04/2021. We discussed LDL goal given her hx of CVA,  Continue Pravastatin 10 mg daily and low fat diet.  History of CVA (cerebrovascular accident) without residual deficits Continue Aspirin 81 mg daily and Pravastatin 10 mg daily.  Return in about 6 months (around 12/11/2022) for chronic problems.  Meggan Dhaliwal G. [place], MD  [facility]. [place] office.

## 2022-06-12 ENCOUNTER — Ambulatory Visit (INDEPENDENT_AMBULATORY_CARE_PROVIDER_SITE_OTHER): Payer: Medicare Other | Admitting: Family Medicine

## 2022-06-12 ENCOUNTER — Encounter: Payer: Self-pay | Admitting: Family Medicine

## 2022-06-12 VITALS — BP 126/70 | HR 75 | Temp 97.5°F | Resp 16 | Ht 62.0 in | Wt 123.4 lb

## 2022-06-12 DIAGNOSIS — I2510 Atherosclerotic heart disease of native coronary artery without angina pectoris: Secondary | ICD-10-CM

## 2022-06-12 DIAGNOSIS — R0602 Shortness of breath: Secondary | ICD-10-CM

## 2022-06-12 DIAGNOSIS — E890 Postprocedural hypothyroidism: Secondary | ICD-10-CM | POA: Diagnosis not present

## 2022-06-12 DIAGNOSIS — I10 Essential (primary) hypertension: Secondary | ICD-10-CM | POA: Diagnosis not present

## 2022-06-12 DIAGNOSIS — Z8673 Personal history of transient ischemic attack (TIA), and cerebral infarction without residual deficits: Secondary | ICD-10-CM | POA: Diagnosis not present

## 2022-06-12 DIAGNOSIS — E785 Hyperlipidemia, unspecified: Secondary | ICD-10-CM | POA: Diagnosis not present

## 2022-06-12 LAB — LIPID PANEL
Cholesterol: 148 mg/dL (ref 0–200)
HDL: 74.9 mg/dL (ref >39.00)
LDL Cholesterol: 56 mg/dL (ref 0–99)
NonHDL: 73.39
Total CHOL/HDL Ratio: 2
Triglycerides: 87 mg/dL (ref 0.0–149.0)
VLDL: 17.4 mg/dL (ref 0.0–40.0)

## 2022-06-12 LAB — COMPREHENSIVE METABOLIC PANEL
ALT: 14 U/L (ref 0–35)
AST: 24 U/L (ref 0–37)
Albumin: 4.5 g/dL (ref 3.5–5.2)
Alkaline Phosphatase: 41 U/L (ref 39–117)
BUN: 11 mg/dL (ref 6–23)
CO2: 29 mEq/L (ref 19–32)
Calcium: 9.4 mg/dL (ref 8.4–10.5)
Chloride: 95 mEq/L — ABNORMAL LOW (ref 96–112)
Creatinine, Ser: 0.81 mg/dL (ref 0.40–1.20)
GFR: 69.74 mL/min (ref >60.00)
Glucose, Bld: 89 mg/dL (ref 70–99)
Potassium: 4.5 mEq/L (ref 3.5–5.1)
Sodium: 133 mEq/L — ABNORMAL LOW (ref 135–145)
Total Bilirubin: 0.7 mg/dL (ref 0.2–1.2)
Total Protein: 7.1 g/dL (ref 6.0–8.3)

## 2022-06-12 LAB — TSH: TSH: 0.38 u[IU]/mL (ref 0.35–5.50)

## 2022-06-12 NOTE — Assessment & Plan Note (Signed)
Problem has been well controlled. Last TSH 0.5 in 04/2021. No changes in Levothyroxine dose, it will be adjusted if needed according to TSH result.

## 2022-06-12 NOTE — Assessment & Plan Note (Signed)
Continue Aspirin 81 mg daily and Pravastatin 10 mg daily.

## 2022-06-12 NOTE — Assessment & Plan Note (Signed)
LDL 77 in 04/2021. We discussed LDL goal given her hx of CVA,  Continue Pravastatin 10 mg daily and low fat diet.

## 2022-06-12 NOTE — Assessment & Plan Note (Signed)
BP adequately controlled. Continue current management: HCTZ 25 mg 12.5 mg daily and Metoprolol succinate 25 mg daily. DASH/low salt diet to continue. Continue monitoring BP at home. Eye exam is current.

## 2022-06-12 NOTE — Patient Instructions (Signed)
A few things to remember from today's visit:  Dyslipidemia - Plan: Comprehensive metabolic panel, Lipid panel  Essential hypertension - Plan: Comprehensive metabolic panel  Post-surgical hypothyroidism - Plan: TSH  For now no changes. Today we are checking sodium, if still low we may need to stop hydrochlorothiazide and try something different. Monitor shortness of breath and call your cardiologist if it gets any worse.  If you need refills for medications you take chronically, please call your pharmacy. Do not use My Chart to request refills or for acute issues that need immediate attention. If you send a my chart message, it may take a few days to be addressed, specially if I am not in the office.  Please be sure medication list is accurate. If a new problem present, please set up appointment sooner than planned today.

## 2022-08-03 ENCOUNTER — Other Ambulatory Visit: Payer: Self-pay | Admitting: Family Medicine

## 2022-08-07 ENCOUNTER — Telehealth: Payer: Self-pay

## 2022-08-07 DIAGNOSIS — E890 Postprocedural hypothyroidism: Secondary | ICD-10-CM

## 2022-08-07 NOTE — Telephone Encounter (Signed)
-----  Message from Rodrigo Ran, [facility] sent at 06/13/2022 12:50 PM EST ----- Tsh in 8 weeks

## 2022-08-07 NOTE — Telephone Encounter (Signed)
Can you get her scheduled for a lab appt? Doesn't need to be fasting. Thank you!

## 2022-08-08 ENCOUNTER — Other Ambulatory Visit (INDEPENDENT_AMBULATORY_CARE_PROVIDER_SITE_OTHER): Payer: Medicare Other

## 2022-08-08 DIAGNOSIS — E890 Postprocedural hypothyroidism: Secondary | ICD-10-CM

## 2022-08-08 LAB — TSH: TSH: 1.36 u[IU]/mL (ref 0.35–5.50)

## 2022-09-05 NOTE — Progress Notes (Unsigned)
ACUTE VISIT No chief complaint on file.  HPI: [name] is a 77 y.o. female, who is here today complaining of *** HPI  Review of Systems See other pertinent positives and negatives in HPI.  Current Outpatient Medications on File Prior to Visit  Medication Sig Dispense Refill  . APPLE CIDER VINEGAR PO Take by mouth daily.    Marland Kitchen aspirin 81 MG EC tablet Take 1 tablet by mouth daily.    Marland Kitchen b complex vitamins tablet Take by mouth.    . Calcium-Magnesium-Vitamin D (CITRACAL SLOW RELEASE PO) Take by mouth.    Cristy Friedlander HFA 110 MCG/ACT inhaler Inhale 1 puff into the lungs 2 (two) times daily.    . fluticasone (FLONASE) 50 MCG/ACT nasal spray Place into the nose as needed.    . hydrochlorothiazide (HYDRODIURIL) 25 MG tablet Take 1/2 tablet by mouth daily. 90 tablet 3  . levothyroxine (SYNTHROID) 88 MCG tablet TAKE 1 TABLET BY MOUTH DAILY AT 6 AM 90 tablet 3  . metoprolol succinate (TOPROL-XL) 25 MG 24 hr tablet TAKE 1 TABLET(25 MG) BY MOUTH DAILY 90 tablet 1  . montelukast (SINGULAIR) 10 MG tablet Take 1 tablet (10 mg total) by mouth at bedtime. 90 tablet 0  . OVER THE COUNTER MEDICATION Nutrafol    . Polyethyl Glycol-Propyl Glycol (SYSTANE OP) Apply to eye.    . pravastatin (PRAVACHOL) 10 MG tablet Take 1 tablet (10 mg total) by mouth daily. 90 tablet 3  . pregabalin (LYRICA) 50 MG capsule TAKE 1 CAPSULE(50 MG) BY MOUTH TWICE DAILY 60 capsule 3  . Propylene Glycol 0.6 % SOLN     . RESTASIS 0.05 % ophthalmic emulsion 1 drop 2 (two) times daily.    Marland Kitchen triamcinolone cream (KENALOG) 0.1 % Apply topically as needed.    . Zoledronic Acid (RECLAST IV) Inject into the vein. Last infusion: 12/01/2020     No current facility-administered medications on file prior to visit.    Past Medical History:  Diagnosis Date  . Allergy 2009  . Arthritis   . Bilateral artificial lens implant 2009  . Bronchitis 1994  . Cancer ([place]) 2023 Squamous  . Cataract   . Cervical herniated disc 1986  .  Chronic fatigue 2005  . 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 Coronary artery disease 2005  . Deviated septum 03/2011  . Fibromyalgia 2005  . Graves disease 2001  . Hepatitis C   . History of coronary angiogram 05/2012  . Hx of LASIK 2000  . Hypertension   . Optic disc hemorrhage 2005  . Osteopenia 2005  . Osteoporosis   . Pneumonia 1994  . Proptosis 2011  . Restless leg syndrome 2005  . Sarcoidosis 1980  . Spondylolisthesis   . Strabismus 2013  . Stroke [facility])    Allergies  Allergen Reactions  . Prednisone     Other reaction(s): Other (See Comments) Pain, bloating    Social History   Socioeconomic History  . Marital status: Divorced    Spouse name: Not on file  . Number of children: 3  . Years of education: Not on file  . Highest education level: Not on file  Occupational History  . Not on file  Tobacco Use  . Smoking status: Never    Passive exposure: Past  . Smokeless tobacco: Never  Vaping Use  . Vaping Use: Never used  Substance and Sexual Activity  . Alcohol use: Not Currently    Alcohol/week: 2.0 standard drinks of alcohol  Types: 1 Glasses of wine, 1 Cans of beer per week    Comment: rarely  . Drug use: Never  . Sexual activity: Not Currently  Other Topics Concern  . Not on file  Social History Narrative   Lives alone, has dog-Josie.   3 grown children.   2 granddaughters, who live in [place].   Social Determinants of Health   Financial Resource Strain: Low Risk  (11/03/2021)   Overall Financial Resource Strain (CARDIA)   . Difficulty of Paying Living Expenses: Not hard at all  Food Insecurity: No Food Insecurity (11/03/2021)   Hunger Vital Sign   . Worried About Charity fundraiser in the Last Year: Never true   . Ran Out of Food in the Last Year: Never true  Transportation Needs: No Transportation Needs (11/03/2021)   PRAPARE - Transportation   . Lack of Transportation (Medical): No   . Lack of Transportation (Non-Medical): No  Physical  Activity: Sufficiently Active (11/03/2021)   Exercise Vital Sign   . Days of Exercise per Week: 5 days   . Minutes of Exercise per Session: 30 min  Stress: No Stress Concern Present (11/03/2021)   [place]   . Feeling of Stress : Not at all  Social Connections: Moderately Isolated (11/03/2021)   Social Connection and Isolation Panel [NHANES]   . Frequency of Communication with Friends and Family: More than three times a week   . Frequency of Social Gatherings with Friends and Family: More than three times a week   . Attends Religious Services: Never   . Active Member of Clubs or Organizations: No   . Attends Archivist Meetings: More than 4 times per year   . Marital Status: Divorced    There were no vitals filed for this visit. There is no height or weight on file to calculate BMI.  Physical Exam  ASSESSMENT AND PLAN: There are no diagnoses linked to this encounter.  No follow-ups on file.  Brooke Flammer G. [place], MD  [facility]. [place] office.  Discharge Instructions   None

## 2022-09-06 ENCOUNTER — Ambulatory Visit (INDEPENDENT_AMBULATORY_CARE_PROVIDER_SITE_OTHER)
Admission: RE | Admit: 2022-09-06 | Discharge: 2022-09-06 | Disposition: A | Discharge status: Home / Self Care | Payer: Medicare Other | Source: Ambulatory Visit | Attending: Family Medicine | Admitting: Family Medicine

## 2022-09-06 ENCOUNTER — Encounter: Payer: Self-pay | Admitting: Family Medicine

## 2022-09-06 ENCOUNTER — Ambulatory Visit (INDEPENDENT_AMBULATORY_CARE_PROVIDER_SITE_OTHER): Payer: Medicare Other | Admitting: Family Medicine

## 2022-09-06 VITALS — BP 126/70 | HR 70 | Temp 97.6°F | Resp 16 | Ht 62.0 in | Wt 124.4 lb

## 2022-09-06 DIAGNOSIS — J309 Allergic rhinitis, unspecified: Secondary | ICD-10-CM | POA: Insufficient documentation

## 2022-09-06 DIAGNOSIS — M954 Acquired deformity of chest and rib: Secondary | ICD-10-CM

## 2022-09-06 DIAGNOSIS — M5136 Other intervertebral disc degeneration, lumbar region: Secondary | ICD-10-CM

## 2022-09-06 DIAGNOSIS — M797 Fibromyalgia: Secondary | ICD-10-CM

## 2022-09-06 DIAGNOSIS — M419 Scoliosis, unspecified: Secondary | ICD-10-CM | POA: Insufficient documentation

## 2022-09-06 DIAGNOSIS — R0781 Pleurodynia: Secondary | ICD-10-CM

## 2022-09-06 DIAGNOSIS — M51369 Other intervertebral disc degeneration, lumbar region without mention of lumbar back pain or lower extremity pain: Secondary | ICD-10-CM

## 2022-09-06 DIAGNOSIS — R296 Repeated falls: Secondary | ICD-10-CM | POA: Diagnosis not present

## 2022-09-06 LAB — CBC
HCT: 37.7 % (ref 36.0–46.0)
Hemoglobin: 12.6 g/dL (ref 12.0–15.0)
MCHC: 33.5 g/dL (ref 30.0–36.0)
MCV: 92.4 fl (ref 78.0–100.0)
Platelets: 221 10*3/uL (ref 150.0–400.0)
RBC: 4.08 Mil/uL (ref 3.87–5.11)
RDW: 12.9 % (ref 11.5–15.5)
WBC: 8 10*3/uL (ref 4.0–10.5)

## 2022-09-06 MED ORDER — MONTELUKAST SODIUM 10 MG PO TABS: 10.0000 mg | ORAL_TABLET | Freq: Every day | ORAL | 1 refills | Status: DC

## 2022-09-06 NOTE — Patient Instructions (Addendum)
A few things to remember from today's visit:  Costal margin pain - Plan: DG Ribs Unilateral Left, DG Chest 2 View, CBC  Rib deformity - Plan: DG Ribs Unilateral Left, CBC  Frequent falls - Plan: Ambulatory referral to Physical Therapy  DDD (degenerative disc disease), lumbar - Plan: Ambulatory referral to Physical Therapy  Will given recommendations after imaging results, next step will be a CT. Icy hot patch on affected area.  If you need refills for medications you take chronically, please call your pharmacy. Do not use My Chart to request refills or for acute issues that need immediate attention. If you send a my chart message, it may take a few days to be addressed, specially if I am not in the office.  Please be sure medication list is accurate. If a new problem present, please set up appointment sooner than planned today.

## 2022-09-07 NOTE — Assessment & Plan Note (Signed)
This problem could explain several of her symptomatology. Continue low impact exercise, good sleep hygiene, and Lyrica 50 mg

## 2022-09-07 NOTE — Assessment & Plan Note (Signed)
Hx of spinal stenosis and neurogenic claudication,has had surgical procedures. Continue Lyrica same dose. Fall precautions. I do not think imaging needs to be ordered today.

## 2022-09-07 NOTE — Assessment & Plan Note (Signed)
Some of her chronic medical problems as well as medications can increase the risk for falls. She would benefit from PT, referral placed. Continue fall precautions.

## 2022-09-07 NOTE — Assessment & Plan Note (Signed)
She is having symptoms, ran out of singular over a month ago. Resume Singular 10 mg daily at bedtime. This problem could cause her feeling of not been able to get enough air. Recommend trying to skip medication during seasons her symptoms are mild, if possible.

## 2022-09-11 ENCOUNTER — Other Ambulatory Visit (HOSPITAL_BASED_OUTPATIENT_CLINIC_OR_DEPARTMENT_OTHER): Payer: Self-pay | Admitting: Rheumatology

## 2022-09-11 DIAGNOSIS — Z1211 Encounter for screening for malignant neoplasm of colon: Secondary | ICD-10-CM

## 2022-09-11 DIAGNOSIS — Z1231 Encounter for screening mammogram for malignant neoplasm of breast: Secondary | ICD-10-CM

## 2022-09-27 ENCOUNTER — Other Ambulatory Visit: Payer: Self-pay | Admitting: Family

## 2022-09-28 ENCOUNTER — Encounter (HOSPITAL_BASED_OUTPATIENT_CLINIC_OR_DEPARTMENT_OTHER): Payer: Self-pay

## 2022-09-28 ENCOUNTER — Ambulatory Visit (HOSPITAL_BASED_OUTPATIENT_CLINIC_OR_DEPARTMENT_OTHER)
Admission: RE | Admit: 2022-09-28 | Discharge: 2022-09-28 | Disposition: A | Discharge status: Home / Self Care | Payer: Medicare Other | Source: Ambulatory Visit | Attending: Rheumatology | Admitting: Rheumatology

## 2022-09-28 DIAGNOSIS — M810 Age-related osteoporosis without current pathological fracture: Secondary | ICD-10-CM | POA: Diagnosis present

## 2022-09-28 DIAGNOSIS — Z1231 Encounter for screening mammogram for malignant neoplasm of breast: Secondary | ICD-10-CM

## 2022-09-28 NOTE — Progress Notes (Signed)
DEXA scan is consistent with osteoporosis.  Decrease in BMD noted when compared to the DEXA of 2022.  Recommend repeat Reclast infusion.

## 2022-09-29 ENCOUNTER — Encounter: Payer: Self-pay | Admitting: Pharmacist

## 2022-09-29 ENCOUNTER — Telehealth: Payer: Self-pay | Admitting: Pharmacist

## 2022-09-29 DIAGNOSIS — M810 Age-related osteoporosis without current pathological fracture: Secondary | ICD-10-CM

## 2022-09-29 DIAGNOSIS — Z5181 Encounter for therapeutic drug level monitoring: Secondary | ICD-10-CM

## 2022-09-29 NOTE — Telephone Encounter (Signed)
Error

## 2022-09-29 NOTE — Telephone Encounter (Addendum)
Patient has received four Reclast infusions. She has Medicare A/B + [employer] F supplement. Her supplemental plan is active as of 05/24/2009. Medicare A/B will pick up 80% of cost of infusion. Her [place] will pick up 20% coinsurance that patient would be responsible for (after Part B deductible is met). Plan follows Medicare guidelines and pre-certification will not be required for Reclast J3489 for FDA-approved dx codes when billed.  Phone: [phone]  Patient needs updated labs prior to proceeding with Reclast order. Future rodesr placed for Vitamin D and CME. Left VM for pt regarding labs.  Knox Saliva, PharmD, MPH, BCPS, CPP Clinical Pharmacist (Rheumatology and Pulmonology)  ----- Message from Shona Needles, RT sent at 09/28/2022  3:03 PM EST ----- Regarding: RECLAST INFUSION DEXA scan is consistent with osteoporosis.  Decrease in BMD noted when compared to the DEXA of 2022.  Recommend repeat Reclast infusion.

## 2022-10-02 ENCOUNTER — Other Ambulatory Visit: Payer: Self-pay | Admitting: *Deleted

## 2022-10-02 DIAGNOSIS — M810 Age-related osteoporosis without current pathological fracture: Secondary | ICD-10-CM

## 2022-10-02 DIAGNOSIS — Z5181 Encounter for therapeutic drug level monitoring: Secondary | ICD-10-CM

## 2022-10-03 ENCOUNTER — Other Ambulatory Visit: Payer: Self-pay | Admitting: Pharmacist

## 2022-10-03 DIAGNOSIS — M810 Age-related osteoporosis without current pathological fracture: Secondary | ICD-10-CM

## 2022-10-03 LAB — COMPREHENSIVE METABOLIC PANEL
AG Ratio: 1.9 (calc) (ref 1.0–2.5)
ALT: 17 U/L (ref 6–29)
AST: 23 U/L (ref 10–35)
Albumin: 4.6 g/dL (ref 3.6–5.1)
Alkaline phosphatase (APISO): 41 U/L (ref 37–153)
BUN: 19 mg/dL (ref 7–25)
CO2: 32 mmol/L (ref 20–32)
Calcium: 9.6 mg/dL (ref 8.6–10.4)
Chloride: 91 mmol/L — ABNORMAL LOW (ref 98–110)
Creat: 0.73 mg/dL (ref 0.60–1.00)
Globulin: 2.4 g/dL (calc) (ref 1.9–3.7)
Glucose, Bld: 164 mg/dL — ABNORMAL HIGH (ref 65–99)
Potassium: 4.1 mmol/L (ref 3.5–5.3)
Sodium: 131 mmol/L — ABNORMAL LOW (ref 135–146)
Total Bilirubin: 0.5 mg/dL (ref 0.2–1.2)
Total Protein: 7 g/dL (ref 6.1–8.1)

## 2022-10-03 LAB — VITAMIN D 25 HYDROXY (VIT D DEFICIENCY, FRACTURES): Vit D, 25-Hydroxy: 64 ng/mL (ref 30–100)

## 2022-10-03 NOTE — Progress Notes (Signed)
I did not order the mammogram although the results are normal.  Please forward results to patient's PCP.

## 2022-10-03 NOTE — Telephone Encounter (Signed)
Labs from 10/02/2022 (Vitamin D and CMP) wnl. Order placed for [facility]. Patient provided with phone number to schedule infusion.  Knox Saliva, PharmD, MPH, BCPS, CPP Clinical Pharmacist (Rheumatology and Pulmonology)

## 2022-10-03 NOTE — Progress Notes (Signed)
Next infusion due  for Reclast IV  Diagnosis: age-related osteoporosis  Dose: '5mg'$  IV every 12 months. This is her fifth infusion (previously received in 2012, 2013, 2014, 2022)  Last Clinic Visit: 05/03/2022 Next Clinic Visit: 11/21/2023  Last infusion: 2022  Labs: CMP and Vitamin D on 10/02/22 wnl  Orders placed for Reclast IV x 1 dose along with premedication of acetaminophen and diphenhydramine to be administered 30 minutes before medication infusion.  Called patient and provided with phone number for: [facility] [phone]) [place]  Will follow-up to ensured scheduled and completed  Knox Saliva, PharmD, MPH, BCPS, CPP Clinical Pharmacist (Rheumatology and Pulmonology)

## 2022-10-03 NOTE — Progress Notes (Signed)
Glucose is elevated.  Sodium and chloride are low and stable.  Vitamin D is within normal limits.  Please forward results to her PCP.

## 2022-10-05 NOTE — Progress Notes (Signed)
Reclast scheduled for 10/20/22. Will f/u ensure completed  Knox Saliva, PharmD, MPH, BCPS, CPP Clinical Pharmacist (Rheumatology and Pulmonology)

## 2022-10-14 ENCOUNTER — Other Ambulatory Visit: Payer: Self-pay | Admitting: Cardiology

## 2022-10-20 ENCOUNTER — Ambulatory Visit (HOSPITAL_COMMUNITY)
Admission: RE | Admit: 2022-10-20 | Discharge: 2022-10-20 | Disposition: A | Discharge status: Home / Self Care | Payer: Medicare Other | Source: Ambulatory Visit | Attending: Rheumatology | Admitting: Rheumatology

## 2022-10-20 DIAGNOSIS — M810 Age-related osteoporosis without current pathological fracture: Secondary | ICD-10-CM

## 2022-10-20 MED ORDER — ZOLEDRONIC ACID 5 MG/100ML IV SOLN
5.0000 mg | Freq: Once | INTRAVENOUS | Status: AC
  Administered 2022-10-20: 5 mg via INTRAVENOUS

## 2022-10-20 MED ORDER — ACETAMINOPHEN 325 MG PO TABS
ORAL_TABLET | ORAL | Status: AC
  Filled 2022-10-20: qty 2

## 2022-10-20 MED ORDER — ZOLEDRONIC ACID 5 MG/100ML IV SOLN
INTRAVENOUS | Status: AC
  Filled 2022-10-20: qty 100

## 2022-10-20 MED ORDER — ACETAMINOPHEN 325 MG PO TABS
650.0000 mg | ORAL_TABLET | Freq: Once | ORAL | Status: AC
  Administered 2022-10-20: 650 mg via ORAL

## 2022-10-20 MED ORDER — DIPHENHYDRAMINE HCL 25 MG PO CAPS
25.0000 mg | ORAL_CAPSULE | Freq: Once | ORAL | Status: AC
  Administered 2022-10-20: 25 mg via ORAL

## 2022-10-20 MED ORDER — DIPHENHYDRAMINE HCL 25 MG PO CAPS
ORAL_CAPSULE | ORAL | Status: AC
  Filled 2022-10-20: qty 1

## 2022-10-27 ENCOUNTER — Telehealth: Payer: Self-pay | Admitting: Family Medicine

## 2022-10-27 NOTE — Telephone Encounter (Signed)
Contacted Brooke Sanders to schedule their annual wellness visit. Patient declined to schedule AWV at this time. Changed provider to Betty [place], MD  Verlee Rossetti; Care Guide Ambulatory Clinical Support [place] [facility] Direct Dial: [phone]

## 2022-11-06 ENCOUNTER — Telehealth: Payer: Self-pay | Admitting: Family Medicine

## 2022-11-06 NOTE — Telephone Encounter (Signed)
Contacted Dimple A. Kaatz to schedule their annual wellness visit. Appointment made for 11/10/22. Rudell Cobb AWV direct phone # [phone]

## 2022-11-08 NOTE — Progress Notes (Signed)
Office Visit Note  Patient: Brooke Sanders             Date of Birth: [birth year]           MRN: [id number]             PCP: [place], Betty G, MD Referring: [place], Betty G, MD Visit Date: 11/21/2022 Occupation: @GUAROCC @  Subjective:  Generalized pain and discomfort  History of Present Illness: Brooke Sanders is a 77 y.o. female history of sarcoidosis, osteoarthritis and degenerative disc disease.  She states recently she has been having discomfort in her rib cage.  She was seen by her PCP and had x-rays which showed thoracic scoliosis.  She had physical therapy for almost 2 months and has been doing exercises at home.  She continues to have some discomfort in the thoracic and rib cage region.  She has off-and-on discomfort in her hands with the barometric pressure change.  She continues to have some discomfort in her right shoulder.  She continues to have generalized pain and discomfort from fibromyalgia.    Activities of Daily Living:  Patient reports morning stiffness for a few minutes.   Patient Reports nocturnal pain.  Difficulty dressing/grooming: Denies Difficulty climbing stairs: Denies Difficulty getting out of chair: Denies Difficulty using hands for taps, buttons, cutlery, and/or writing: Reports  Review of Systems  Constitutional:  Positive for fatigue.  HENT:  Positive for mouth dryness. Negative for mouth sores.   Eyes:  Positive for dryness.  Respiratory:  Negative for shortness of breath.   Cardiovascular:  Negative for chest pain and palpitations.  Gastrointestinal:  Negative for blood in stool, constipation and diarrhea.  Endocrine: Negative for increased urination.  Genitourinary:  Negative for involuntary urination.  Musculoskeletal:  Positive for joint pain, joint pain, joint swelling, myalgias, morning stiffness, muscle tenderness and myalgias. Negative for gait problem and muscle weakness.  Skin:  Negative for color change, rash, hair loss and  sensitivity to sunlight.  Allergic/Immunologic: Negative for susceptible to infections.  Neurological:  Negative for dizziness and headaches.  Hematological:  Negative for swollen glands.  Psychiatric/Behavioral:  Negative for depressed mood and sleep disturbance. The patient is not nervous/anxious.     PMFS History:  Patient Active Problem List   Diagnosis Date Noted   Frequent falls 09/06/2022   Chronic allergic rhinitis 09/06/2022   History of CVA (cerebrovascular accident) without residual deficits 01/11/2022   Pure hypercholesterolemia 01/11/2022   Chronic cough 02/21/2021   DDD (degenerative disc disease), cervical 11/11/2020   Chondrocalcinosis 11/11/2020   Condyloma 02/11/2020   History of colonic polyps 09/19/2019   Ganglion cyst of volar aspect of right wrist 07/04/2018   Sarcoidosis 06/24/2018   Primary osteoarthritis of both hands 06/24/2018   Primary osteoarthritis of both knees 06/24/2018   DDD (degenerative disc disease), lumbar 06/24/2018   Fibromyalgia 06/24/2018   Age-related osteoporosis without current pathological fracture 06/24/2018   Essential hypertension 06/24/2018   Dyslipidemia 06/24/2018   History of coronary artery disease 06/24/2018   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   Allergy to pollen 06/06/2018   Coronary artery disease involving native coronary artery of native heart without angina pectoris 04/23/2017    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  Coronary artery disease 2005   Deviated septum 03/2011   Fibromyalgia 2005   Graves disease 2001   Hepatitis C    History of coronary angiogram 05/2012   Hx of LASIK 2000   Hypertension    Optic disc hemorrhage 2005   Osteopenia 2005   Osteoporosis    Pneumonia 1994    Proptosis 2011   Restless leg syndrome 2005   Sarcoidosis 1980   Spondylolisthesis    Strabismus 2013   Stroke [facility])     Family History  Problem Relation Age of Onset   Vision loss Mother    Varicose Veins Mother    Heart disease Father    Stroke Father    Vision loss Father    Heart disease Brother    Vision loss Brother    Vision loss Sister    Past Surgical History:  Procedure Laterality Date   ABDOMINAL HYSTERECTOMY  2000   CARPAL TUNNEL RELEASE Right 2009   CHOLECYSTECTOMY  1975   COLON SURGERY  2021   COLONOSCOPY     x3 in 6 months. 2020-2021   cystic ectopic ovary  2000   EYE SURGERY     LAMINECTOMY  1986   LUMBAR DISC SURGERY  1986   orbital decompression Bilateral 03/2011   ossified ganglion surgery Right 1974   SECONDARY INTRAOCULAR LENSE IMPLANTATION Right 05/2012   SPINE SURGERY     TOTAL THYROIDECTOMY  2011   TUMOR REMOVAL Left 10/06/2021   left leg   Social History   Social History Narrative   Lives alone, has dog-Josie.   3 grown children.   2 granddaughters, who live in [place].   Immunization History  Administered Date(s) Administered   Fluad Quad(high Dose 65+) 04/26/2021, 04/06/2022   Influenza-Unspecified 03/14/2019   PFIZER(Purple Top)SARS-COV-2 Vaccination 08/12/2019, 09/01/2019, 04/02/2020, 10/22/2020   Pneumococcal Conjugate-13 07/24/2014   Pneumococcal Polysaccharide-23 03/06/2018   Rsv, Bivalent, Protein Subunit Rsvpref,pf Verdis Frederickson) 04/06/2022   Tdap 01/24/2019   Zoster Recombinat (Shingrix) 05/29/2018, 07/30/2018, 01/24/2019     Objective: Vital Signs: BP 126/71 (BP Location: Left Arm, Patient Position: Sitting, Cuff Size: Normal)   Pulse 62   Resp 13   Ht 5' (1.524 m)   Wt 122 lb 12.8 oz (55.7 kg)   BMI 23.98 kg/m    Physical Exam Vitals and nursing note reviewed.  Constitutional:      Appearance: She is well-developed.  HENT:     Head: Normocephalic and atraumatic.  Eyes:     Conjunctiva/sclera: Conjunctivae normal.   Cardiovascular:     Rate and Rhythm: Normal rate and regular rhythm.     Heart sounds: Normal heart sounds.  Pulmonary:     Effort: Pulmonary effort is normal.     Breath sounds: Normal breath sounds.  Abdominal:     General: Bowel sounds are normal.     Palpations: Abdomen is soft.  Musculoskeletal:     Cervical back: Normal range of motion.  Lymphadenopathy:     Cervical: No cervical adenopathy.  Skin:    General: Skin is warm and dry.     Capillary Refill: Capillary refill takes less than 2 seconds.  Neurological:     Mental Status: She is alert and oriented to person, place, and time.  Psychiatric:        Behavior: Behavior normal.      Musculoskeletal Exam: She had limited lateral rotation of the cervical spine.  She had limited range of motion of thoracic and lumbar spine.  She had surgical fusion of the lumbar spine.  Shoulders, elbows, wrist joints were in good range of motion.  She had no MCP PIP or DIP thickening.  Hip joints and knee joints were in good range of motion.  There was no tenderness over ankles or MTPs.  CDAI Exam: CDAI Score: -- Patient Global: --; Provider Global: -- Swollen: --; Tender: -- Joint Exam 11/21/2022   No joint exam has been documented for this visit   There is currently no information documented on the homunculus. Go to the Rheumatology activity and complete the homunculus joint exam.  Investigation: No additional findings.  Imaging: No results found.  Recent Labs: Lab Results  Component Value Date   WBC 8.0 09/06/2022   HGB 12.6 09/06/2022   PLT 221.0 09/06/2022   NA 131 (L) 10/02/2022   K 4.1 10/02/2022   CL 91 (L) 10/02/2022   CO2 32 10/02/2022   GLUCOSE 164 (H) 10/02/2022   BUN 19 10/02/2022   CREATININE 0.73 10/02/2022   BILITOT 0.5 10/02/2022   ALKPHOS 41 06/12/2022   AST 23 10/02/2022   ALT 17 10/02/2022   PROT 7.0 10/02/2022   ALBUMIN 4.5 06/12/2022   CALCIUM 9.6 10/02/2022   GFRAA 98 11/11/2020     Speciality Comments: Off Reclast since 2015.  Reclast infusion Dec 01, 2020  Procedures:  No procedures performed Allergies: Prednisone   Assessment / Plan:     Visit Diagnoses: Sarcoidosis - She was diagnosed in 1980 after positive cervical lymph node biopsy.ACE normal.  She had no recurrence of sarcoidosis since then.  Recent chest x-ray on September 09, 2022 did not show acute cardiopulmonary disease.  She denies any shortness of breath.  Primary osteoarthritis of both hands -she continues to have some stiffness in her hands.  Clinical and radiographic findings are consistent with osteoarthritis.  Joint protection muscle strengthening was discussed.  Biceps tendinitis of both shoulders-she gives history of intermittent discomfort in her shoulders.  Her shoulder joints were in good range of motion without any warmth or effusion.  Primary osteoarthritis of both knees -she complains of discomfort in her knee joints.  She had osteoarthritis with chondromalacia patella.  Lower extremity muscle strengthening exercises were discussed.  DDD (degenerative disc disease), cervical-she has limited lateral rotation of the cervical spine.  Range of motion exercises were discussed.  Other idiopathic scoliosis, thoracic region-she has been having discomfort in the rib cage.  She had recent chest x-ray which showed thoracic scoliosis.  She went to physical therapy and had been doing some exercises at home.  DDD (degenerative disc disease), lumbar - Status post fusion x2.  She continues to have lower back discomfort and has limited range of motion.  Chondrocalcinosis -she does not have symptoms of active pseudogout.    Hair loss  Fibromyalgia -she continues to have generalized pain and discomfort from fibromyalgia.  She is on Lyrica 50 mg 1 tablet twice daily for symptomatic relief.  Other fatigue-secondary to fibromyalgia.  Age-related osteoporosis without current pathological fracture - DEXA  09/28/2022 BMD measured at Forearm Radius 33% is 0.633 g/cm2, T-scoreof -2.8. Prev therapy: Reclast x4. 2012,2013,2014 gap until 12/01/20.  She is skipped 2023 and then had Reclast IV on  10/20/22.  Patient tolerated Reclast well without any side effects.  Calcium rich diet and vitamin D was discussed.  We plan to repeat DEXA scan in 2026.  Essential hypertension-blood pressure was normal at 126/71.  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  History of hypothyroidism  Orders: No orders of the defined types were placed in this encounter.  No orders of the defined types were placed in this encounter.    Follow-Up Instructions: Return in about 6 months (around 05/23/2023) for Sarcoidosis, Osteoarthritis.   Pollyann Savoy, MD  Note - This record has been created using Animal nutritionist.  Chart creation errors have been sought, but may not always  have been located. Such creation errors do not reflect on  the standard of medical care.

## 2022-11-10 ENCOUNTER — Ambulatory Visit (INDEPENDENT_AMBULATORY_CARE_PROVIDER_SITE_OTHER): Payer: Medicare Other

## 2022-11-10 VITALS — Ht 60.0 in | Wt 120.0 lb

## 2022-11-10 DIAGNOSIS — Z0000 Encounter for general adult medical examination without abnormal findings: Secondary | ICD-10-CM

## 2022-11-10 NOTE — Patient Instructions (Addendum)
Brooke Sanders , Thank you for taking time to come for your Medicare Wellness Visit. I appreciate your ongoing commitment to your health goals. Please review the following plan we discussed and let me know if I can assist you in the future.   These are the goals we discussed:  Goals       Exercise 150 min/wk Moderate Activity (pt-stated)      Continue to exercise and stay healthy.         This is a list of the screening recommended for you and due dates:  Health Maintenance  Topic Date Due   COVID-19 Vaccine (5 - 2023-24 season) 11/26/2022*   Hepatitis C Screening: USPSTF Recommendation to screen - Ages 18-79 yo.  06/13/2023*   Flu Shot  02/22/2023   Medicare Annual Wellness Visit  11/10/2023   DTaP/Tdap/Td vaccine (2 - Td or Tdap) 01/23/2029   Pneumonia Vaccine  Completed   DEXA scan (bone density measurement)  Completed   Zoster (Shingles) Vaccine  Completed   HPV Vaccine  Aged Out  *Topic was postponed. The date shown is not the original due date.    Advanced directives: ASSESSMENT:Please bring a copy of your health care power of attorney and living will to the office to be added to your chart at your convenience.   Conditions/risks identified: None  Next appointment: Follow up in one year for your annual wellness visit    Preventive Care 65 Years and Older, Female Preventive care refers to lifestyle choices and visits with your health care provider that can promote health and wellness. What does preventive care include? A yearly physical exam. This is also called an annual well check. Dental exams once or twice a year. Routine eye exams. Ask your health care provider how often you should have your eyes checked. Personal lifestyle choices, including: Daily care of your teeth and gums. Regular physical activity. Eating a healthy diet. Avoiding tobacco and drug use. Limiting alcohol use. Practicing safe sex. Taking low-dose aspirin every day. Taking vitamin and  mineral supplements as recommended by your health care provider. What happens during an annual well check? The services and screenings done by your health care provider during your annual well check will depend on your age, overall health, lifestyle risk factors, and family history of disease. Counseling  Your health care provider may ask you questions about your: Alcohol use. Tobacco use. Drug use. Emotional well-being. Home and relationship well-being. Sexual activity. Eating habits. History of falls. Memory and ability to understand (cognition). Work and work Astronomer. Reproductive health. Screening  You may have the following tests or measurements: Height, weight, and BMI. Blood pressure. Lipid and cholesterol levels. These may be checked every 5 years, or more frequently if you are over 34 years old. Skin check. Lung cancer screening. You may have this screening every year starting at age 24 if you have a 30-pack-year history of smoking and currently smoke or have quit within the past 15 years. Fecal occult blood test (FOBT) of the stool. You may have this test every year starting at age 75. Flexible sigmoidoscopy or colonoscopy. You may have a sigmoidoscopy every 5 years or a colonoscopy every 10 years starting at age 40. Hepatitis C blood test. Hepatitis B blood test. Sexually transmitted disease (STD) testing. Diabetes screening. This is done by checking your blood sugar (glucose) after you have not eaten for a while (fasting). You may have this done every 1-3 years. Bone density scan. This is done to  screen for osteoporosis. You may have this done starting at age 14. Mammogram. This may be done every 1-2 years. Talk to your health care provider about how often you should have regular mammograms. Talk with your health care provider about your test results, treatment options, and if necessary, the need for more tests. Vaccines  Your health care provider may recommend  certain vaccines, such as: Influenza vaccine. This is recommended every year. Tetanus, diphtheria, and acellular pertussis (Tdap, Td) vaccine. You may need a Td booster every 10 years. Zoster vaccine. You may need this after age 56. Pneumococcal 13-valent conjugate (PCV13) vaccine. One dose is recommended after age 32. Pneumococcal polysaccharide (PPSV23) vaccine. One dose is recommended after age 84. Talk to your health care provider about which screenings and vaccines you need and how often you need them. This information is not intended to replace advice given to you by your health care provider. Make sure you discuss any questions you have with your health care provider. Document Released: 08/06/2015 Document Revised: 03/29/2016 Document Reviewed: 05/11/2015 Elsevier Interactive Patient Education  2017 [address] Prevention in the Home Falls can cause injuries. They can happen to people of all ages. There are many things you can do to make your home safe and to help prevent falls. What can I do on the outside of my home? Regularly fix the edges of walkways and driveways and fix any cracks. Remove anything that might make you trip as you walk through a door, such as a raised step or threshold. Trim any bushes or trees on the path to your home. Use bright outdoor lighting. Clear any walking paths of anything that might make someone trip, such as rocks or tools. Regularly check to see if handrails are loose or broken. Make sure that both sides of any steps have handrails. Any raised decks and porches should have guardrails on the edges. Have any leaves, snow, or ice cleared regularly. Use sand or salt on walking paths during winter. Clean up any spills in your garage right away. This includes oil or grease spills. What can I do in the bathroom? Use night lights. Install grab bars by the toilet and in the tub and shower. Do not use towel bars as grab bars. Use non-skid mats or  decals in the tub or shower. If you need to sit down in the shower, use a plastic, non-slip stool. Keep the floor dry. Clean up any water that spills on the floor as soon as it happens. Remove soap buildup in the tub or shower regularly. Attach bath mats securely with double-sided non-slip rug tape. Do not have throw rugs and other things on the floor that can make you trip. What can I do in the bedroom? Use night lights. Make sure that you have a light by your bed that is easy to reach. Do not use any sheets or blankets that are too big for your bed. They should not hang down onto the floor. Have a firm chair that has side arms. You can use this for support while you get dressed. Do not have throw rugs and other things on the floor that can make you trip. What can I do in the kitchen? Clean up any spills right away. Avoid walking on wet floors. Keep items that you use a lot in easy-to-reach places. If you need to reach something above you, use a strong step stool that has a grab bar. Keep electrical cords out of the way.  Do not use floor polish or wax that makes floors slippery. If you must use wax, use non-skid floor wax. Do not have throw rugs and other things on the floor that can make you trip. What can I do with my stairs? Do not leave any items on the stairs. Make sure that there are handrails on both sides of the stairs and use them. Fix handrails that are broken or loose. Make sure that handrails are as long as the stairways. Check any carpeting to make sure that it is firmly attached to the stairs. Fix any carpet that is loose or worn. Avoid having throw rugs at the top or bottom of the stairs. If you do have throw rugs, attach them to the floor with carpet tape. Make sure that you have a light switch at the top of the stairs and the bottom of the stairs. If you do not have them, ask someone to add them for you. What else can I do to help prevent falls? Wear shoes that: Do not  have high heels. Have rubber bottoms. Are comfortable and fit you well. Are closed at the toe. Do not wear sandals. If you use a stepladder: Make sure that it is fully opened. Do not climb a closed stepladder. Make sure that both sides of the stepladder are locked into place. Ask someone to hold it for you, if possible. Clearly mark and make sure that you can see: Any grab bars or handrails. First and last steps. Where the edge of each step is. Use tools that help you move around (mobility aids) if they are needed. These include: Canes. Walkers. Scooters. Crutches. Turn on the lights when you go into a dark area. Replace any light bulbs as soon as they burn out. Set up your furniture so you have a clear path. Avoid moving your furniture around. If any of your floors are uneven, fix them. If there are any pets around you, be aware of where they are. Review your medicines with your doctor. Some medicines can make you feel dizzy. This can increase your chance of falling. Ask your doctor what other things that you can do to help prevent falls. This information is not intended to replace advice given to you by your health care provider. Make sure you discuss any questions you have with your health care provider. Document Released: 05/06/2009 Document Revised: 12/16/2015 Document Reviewed: 08/14/2014 Elsevier Interactive Patient Education  2017 [employer].

## 2022-11-10 NOTE — Progress Notes (Signed)
Subjective:   Brooke Sanders is a 77 y.o. female who presents for Medicare Annual (Subsequent) preventive examination.  Review of Systems    Virtual Visit via Telephone Note  I connected with  Brooke Sanders on 11/10/22 at 10:00 AM EDT by telephone and verified that I am speaking with the correct person using two identifiers.  Location: Patient: Home Provider: Office Persons participating in the virtual visit: patient/Nurse Health Advisor   I discussed the limitations, risks, security and privacy concerns of performing an evaluation and management service by telephone and the availability of in person appointments. The patient expressed understanding and agreed to proceed.  Interactive audio and video telecommunications were attempted between this nurse and patient, however failed, due to patient having technical difficulties OR patient did not have access to video capability.  We continued and completed visit with audio only.  Some vital signs may be absent or patient reported.   Tillie Rung, LPN  Cardiac Risk Factors include: advanced age (>71men, >65 women)     Objective:    Today's Vitals   11/10/22 1000  Weight: 120 lb (54.4 kg)  Height: 5' (1.524 m)   Body mass index is 23.44 kg/m.     11/10/2022   10:11 AM 11/03/2021    8:14 PM 11/03/2021    2:57 PM  Advanced Directives  Does Patient Have a Medical Advance Directive? Yes Yes Yes  Type of Estate agent of [place];Living will Living will;Healthcare Power of [employer] Power of [place];Living will  Does patient want to make changes to medical advance directive?  No - Patient declined   Copy of Healthcare Power of Attorney in Chart? No - copy requested No - copy requested No - copy requested  Would patient like information on creating a medical advance directive?  No - Patient declined No - Patient declined    Current Medications (verified) Outpatient Encounter  Medications as of 11/10/2022  Medication Sig   APPLE CIDER VINEGAR PO Take by mouth daily.   aspirin 81 MG EC tablet Take 1 tablet by mouth daily.   b complex vitamins tablet Take by mouth.   Calcium-Magnesium-Vitamin D (CITRACAL SLOW RELEASE PO) Take by mouth.   FLOVENT HFA 110 MCG/ACT inhaler Inhale 1 puff into the lungs 2 (two) times daily.   fluticasone (FLONASE) 50 MCG/ACT nasal spray Place into the nose as needed.   hydrochlorothiazide (HYDRODIURIL) 25 MG tablet TAKE 1 TABLET(25 MG) BY MOUTH DAILY   levothyroxine (SYNTHROID) 88 MCG tablet TAKE 1 TABLET BY MOUTH DAILY AT 6 AM   metoprolol succinate (TOPROL-XL) 25 MG 24 hr tablet TAKE 1 TABLET(25 MG) BY MOUTH DAILY   montelukast (SINGULAIR) 10 MG tablet Take 1 tablet (10 mg total) by mouth at bedtime.   OVER THE COUNTER MEDICATION Nutrafol   Polyethyl Glycol-Propyl Glycol (SYSTANE OP) Apply to eye.   pravastatin (PRAVACHOL) 10 MG tablet Take 1 tablet (10 mg total) by mouth daily.   pregabalin (LYRICA) 50 MG capsule TAKE 1 CAPSULE(50 MG) BY MOUTH TWICE DAILY   Propylene Glycol 0.6 % SOLN    RESTASIS 0.05 % ophthalmic emulsion 1 drop 2 (two) times daily.   triamcinolone cream (KENALOG) 0.1 % Apply topically as needed.   Zoledronic Acid (RECLAST IV) Inject into the vein. Last infusion: 12/01/2020   No facility-administered encounter medications on file as of 11/10/2022.    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 2020-2021   cystic ectopic ovary  2000   EYE SURGERY     LAMINECTOMY  1986   LUMBAR DISC SURGERY  1986   orbital decompression Bilateral 03/2011   ossified ganglion surgery Right 1974   SECONDARY INTRAOCULAR LENSE IMPLANTATION Right 05/2012   SPINE SURGERY     TOTAL THYROIDECTOMY  2011   TUMOR REMOVAL Left 10/06/2021   left leg   Family History  Problem Relation Age of Onset   Vision loss Mother    Varicose Veins Mother    Heart disease Father    Stroke Father    Vision loss Father    Heart disease Brother    Vision loss Brother    Vision loss Sister    Social History   Socioeconomic History   Marital status: Divorced    Spouse name: Not on file   Number of children: 3   Years of education: Not on file   Highest education level: Bachelor's degree (e.g., BA, AB, BS)  Occupational History   Not on file  Tobacco Use   Smoking status: Never    Passive exposure: Past   Smokeless tobacco: Never  Vaping Use   Vaping Use: Never used  Substance and Sexual Activity   Alcohol use: Not Currently    Alcohol/week: 2.0 standard drinks of alcohol    Types: 1 Glasses of wine, 1 Cans of beer per week    Comment: rarely   Drug use: Never   Sexual activity: Not Currently  Other Topics Concern   Not on file  Social History Narrative   Lives alone, has dog-Josie.   3 grown children.   2 granddaughters, who live in [place].   Social Determinants of Health   Financial Resource Strain: Low Risk  (11/10/2022)   Overall Financial Resource Strain (CARDIA)    Difficulty of Paying Living Expenses: Not hard at all  Food Insecurity: No Food Insecurity  (11/10/2022)   Hunger Vital Sign    Worried About Running Out of Food in the Last Year: Never true    Ran Out of Food in the Last Year: Never true  Transportation Needs: No Transportation Needs (11/10/2022)   PRAPARE - Administrator, Civil Service (Medical): No    Lack of Transportation (Non-Medical): No  Physical Activity: Sufficiently Active (11/10/2022)   Exercise Vital Sign    Days of Exercise per Week: 7 days    Minutes of Exercise per Session: 30 min  Stress: No Stress Concern  Present (11/10/2022)   [employer] of Occupational Health - Occupational Stress Questionnaire    Feeling of Stress : Not at all  Recent Concern: Stress - Stress Concern Present (09/05/2022)   [employer] of Occupational Health - Occupational Stress Questionnaire    Feeling of Stress : To some extent  Social Connections: Moderately Isolated (11/10/2022)   Social Connection and Isolation Panel [NHANES]    Frequency of Communication with Friends and Family: More than three times a week    Frequency of Social Gatherings with Friends and Family: More than three times a week    Attends Religious Services: Never    Database administrator or Organizations: Yes    Attends Engineer, structural: More than 4 times per year    Marital Status: Divorced    Tobacco Counseling Counseling given: Not Answered   Clinical Intake:  Pre-visit preparation completed: Yes  Pain : No/denies pain     BMI - recorded: 23.44 Nutritional Status: BMI of 19-24  Normal Nutritional Risks: None Diabetes: No  How often do you need to have someone help you when you read instructions, pamphlets, or other written materials from your doctor or pharmacy?: 1 - Never  Diabetic?  No  Interpreter Needed?: No  Information entered by :: Theresa Mulligan LPN   Activities of Daily Living    11/10/2022   10:08 AM 11/08/2022    4:26 PM  In your present state of health, do you have any difficulty performing  the following activities:  Hearing? 1 0  Comment Wears hearing aids   Vision? 0 1  Difficulty concentrating or making decisions? 0 0  Walking or climbing stairs? 0 0  Dressing or bathing? 0 0  Doing errands, shopping? 0 0  Preparing Food and eating ? N N  Using the Toilet? N N  In the past six months, have you accidently leaked urine? N N  Do you have problems with loss of bowel control? N N  Managing your Medications? N N  Managing your Finances? N N  Housekeeping or managing your Housekeeping? N N    Patient Care Team: [place], Betty G, MD as PCP - General (Family Medicine) Jodelle Red, MD as PCP - Cardiology (Cardiology) Jodelle Red, MD as Consulting Physician (Cardiology)  Indicate any recent Medical Services you may have received from other than [facility] providers in the past year (date may be approximate).     Assessment:   This is a routine wellness examination for Tyrah.  Hearing/Vision screen Hearing Screening - Comments:: Wears Hearing aids Vision Screening - Comments:: Wears rx glasses - up to date with routine eye exams with  [facility]  Dietary issues and exercise activities discussed: Exercise limited by: None identified   Goals Addressed               This Visit's Progress     Exercise 150 min/wk Moderate Activity (pt-stated)        Continue to exercise and stay healthy.        Depression Screen    11/10/2022   10:06 AM 06/12/2022    9:01 AM 06/12/2022    8:54 AM 12/07/2021   10:33 AM 11/03/2021    2:41 PM 04/26/2021    8:47 AM 01/18/2021    9:57 AM  PHQ 2/9 Scores  PHQ - 2 Score 0 0 0 0 1 0 0    Fall Risk    11/10/2022   10:09 AM 11/08/2022  4:26 PM 09/05/2022    6:17 PM 06/12/2022    9:01 AM 06/12/2022    8:54 AM  Fall Risk   Falls in the past year? 0 0 1 0 0  Number falls in past yr: 0 0 1 0 0  Injury with Fall? 0 0 1 0 0  Risk for fall due to : No Fall Risks   Other (Comment) Other (Comment)  Follow up  Falls prevention discussed   Falls evaluation completed Falls evaluation completed    FALL RISK PREVENTION PERTAINING TO THE HOME:  Any stairs in or around the home? No  If so, are there any without handrails? No  Home free of loose throw rugs in walkways, pet beds, electrical cords, etc? Yes  Adequate lighting in your home to reduce risk of falls? Yes   ASSISTIVE DEVICES UTILIZED TO PREVENT FALLS:  Life alert? Yes Use of a cane, walker or w/c? No  Grab bars in the bathroom? No  Shower chair or bench in shower? Yes  Elevated toilet seat or a handicapped toilet? Yes  TIMED UP AND GO:  Was the test performed? No . Audio visit   Cognitive Function:        11/10/2022   10:11 AM 11/03/2021    3:08 PM  6CIT Screen  What Year? 0 points 0 points  What month? 0 points 0 points  What time? 0 points 0 points  Count back from 20 0 points 0 points  Months in reverse 0 points 0 points  Repeat phrase 0 points 0 points  Total Score 0 points 0 points    Immunizations Immunization History  Administered Date(s) Administered   Fluad Quad(high Dose 65+) 04/26/2021, 04/06/2022   Influenza-Unspecified 03/14/2019   PFIZER(Purple Top)SARS-COV-2 Vaccination 08/12/2019, 09/01/2019, 04/02/2020, 10/22/2020   Pneumococcal Conjugate-13 07/24/2014   Pneumococcal Polysaccharide-23 03/06/2018   Rsv, Bivalent, Protein Subunit Rsvpref,pf Verdis Frederickson) 04/06/2022   Tdap 01/24/2019   Zoster Recombinat (Shingrix) 05/29/2018, 07/30/2018, 01/24/2019    TDAP status: Up to date  Flu Vaccine status: Up to date  Pneumococcal vaccine status: Up to date  Covid-19 vaccine status: Completed vaccines  Qualifies for Shingles Vaccine? Yes   Zostavax completed Yes   Shingrix Completed?: Yes  Screening Tests Health Maintenance  Topic Date Due   COVID-19 Vaccine (5 - 2023-24 season) 11/26/2022 (Originally 03/24/2022)   Hepatitis C Screening  06/13/2023 (Originally 06/18/1964)   INFLUENZA VACCINE  02/22/2023    Medicare Annual Wellness (AWV)  11/10/2023   DTaP/Tdap/Td (2 - Td or Tdap) 01/23/2029   Pneumonia Vaccine 62+ Years old  Completed   DEXA SCAN  Completed   Zoster Vaccines- Shingrix  Completed   HPV VACCINES  Aged Out    Health Maintenance  There are no preventive care reminders to display for this patient.   Colorectal cancer screening: No longer required.   Mammogram status: No longer required due to Age.  Bone Density status: Completed 09/28/22. Results reflect: Bone density results: OSTEOPOROSIS. Repeat every   years.  Lung Cancer Screening: (Low Dose CT Chest recommended if Age 49-80 years, 30 pack-year currently smoking OR have quit w/in 15years.) does not qualify.     Additional Screening:  Hepatitis C Screening: does qualify; Completed Deferred  Vision Screening: Recommended annual ophthalmology exams for early detection of glaucoma and other disorders of the eye. Is the patient up to date with their annual eye exam?  Yes  Who is the provider or what is the name of the office in  which the patient attends annual eye exams? [facility] If pt is not established with a provider, would they like to be referred to a provider to establish care? No .   Dental Screening: Recommended annual dental exams for proper oral hygiene  Community Resource Referral / Chronic Care Management:  CRR required this visit?  No   CCM required this visit?  No      Plan:     I have personally reviewed and noted the following in the patient's chart:   Medical and social history Use of alcohol, tobacco or illicit drugs  Current medications and supplements including opioid prescriptions. Patient is not currently taking opioid prescriptions. Functional ability and status Nutritional status Physical activity Advanced directives List of other physicians Hospitalizations, surgeries, and ER visits in previous 12 months Vitals Screenings to include cognitive, depression, and  falls Referrals and appointments  In addition, I have reviewed and discussed with patient certain preventive protocols, quality metrics, and best practice recommendations. A written personalized care plan for preventive services as well as general preventive health recommendations were provided to patient.     Tillie Rung, LPN   [id number]/60/[id number]   Nurse Notes: None

## 2022-11-21 ENCOUNTER — Ambulatory Visit: Payer: Medicare Other | Attending: Rheumatology | Admitting: Rheumatology

## 2022-11-21 ENCOUNTER — Encounter: Payer: Self-pay | Admitting: Rheumatology

## 2022-11-21 VITALS — BP 126/71 | HR 62 | Resp 13 | Ht 60.0 in | Wt 122.8 lb

## 2022-11-21 DIAGNOSIS — M797 Fibromyalgia: Secondary | ICD-10-CM | POA: Diagnosis present

## 2022-11-21 DIAGNOSIS — M1120 Other chondrocalcinosis, unspecified site: Secondary | ICD-10-CM | POA: Diagnosis present

## 2022-11-21 DIAGNOSIS — E785 Hyperlipidemia, unspecified: Secondary | ICD-10-CM

## 2022-11-21 DIAGNOSIS — R5383 Other fatigue: Secondary | ICD-10-CM

## 2022-11-21 DIAGNOSIS — M7521 Bicipital tendinitis, right shoulder: Secondary | ICD-10-CM

## 2022-11-21 DIAGNOSIS — I10 Essential (primary) hypertension: Secondary | ICD-10-CM | POA: Diagnosis present

## 2022-11-21 DIAGNOSIS — M51369 Other intervertebral disc degeneration, lumbar region without mention of lumbar back pain or lower extremity pain: Secondary | ICD-10-CM

## 2022-11-21 DIAGNOSIS — C44729 Squamous cell carcinoma of skin of left lower limb, including hip: Secondary | ICD-10-CM

## 2022-11-21 DIAGNOSIS — D869 Sarcoidosis, unspecified: Secondary | ICD-10-CM | POA: Diagnosis present

## 2022-11-21 DIAGNOSIS — L659 Nonscarring hair loss, unspecified: Secondary | ICD-10-CM | POA: Diagnosis present

## 2022-11-21 DIAGNOSIS — M4124 Other idiopathic scoliosis, thoracic region: Secondary | ICD-10-CM

## 2022-11-21 DIAGNOSIS — Z8639 Personal history of other endocrine, nutritional and metabolic disease: Secondary | ICD-10-CM | POA: Diagnosis present

## 2022-11-21 DIAGNOSIS — M5136 Other intervertebral disc degeneration, lumbar region: Secondary | ICD-10-CM | POA: Diagnosis present

## 2022-11-21 DIAGNOSIS — M7522 Bicipital tendinitis, left shoulder: Secondary | ICD-10-CM | POA: Diagnosis present

## 2022-11-21 DIAGNOSIS — M810 Age-related osteoporosis without current pathological fracture: Secondary | ICD-10-CM

## 2022-11-21 DIAGNOSIS — M170 Bilateral primary osteoarthritis of knee: Secondary | ICD-10-CM | POA: Diagnosis present

## 2022-11-21 DIAGNOSIS — M19041 Primary osteoarthritis, right hand: Secondary | ICD-10-CM

## 2022-11-21 DIAGNOSIS — M5030 Other cervical disc degeneration, unspecified cervical region: Secondary | ICD-10-CM

## 2022-11-21 DIAGNOSIS — Z8679 Personal history of other diseases of the circulatory system: Secondary | ICD-10-CM

## 2022-11-21 DIAGNOSIS — M19042 Primary osteoarthritis, left hand: Secondary | ICD-10-CM | POA: Insufficient documentation

## 2022-12-11 NOTE — Progress Notes (Signed)
HPI: Brooke Sanders is a 77 y.o. female, who is here today for chronic disease management.  Last seen on ***  Hypertension:  Medications:*** BP readings at home:*** Side effects:*** Negative for unusual or severe headache, visual changes, exertional chest pain, dyspnea,  focal weakness, or edema.  Lab Results  Component Value Date   CREATININE 0.73 10/02/2022   BUN 19 10/02/2022   NA 131 (L) 10/02/2022   K 4.1 10/02/2022   CL 91 (L) 10/02/2022   CO2 32 10/02/2022   Negative for polydipsia,polyuria, or polyphagia.  ***  Review of Systems  Constitutional:  Negative for activity change, appetite change and fever.  HENT:  Negative for mouth sores and nosebleeds.   Respiratory:  Negative for cough and wheezing.   Gastrointestinal:  Negative for abdominal pain, nausea and vomiting.       Negative for changes in bowel habits.  Genitourinary:  Negative for decreased urine volume, dysuria and hematuria.  Neurological:  Negative for syncope and facial asymmetry.  See other pertinent positives and negatives in HPI.  Current Outpatient Medications on File Prior to Visit  Medication Sig Dispense Refill   APPLE CIDER VINEGAR PO Take by mouth daily.     aspirin 81 MG EC tablet Take 1 tablet by mouth daily.     b complex vitamins tablet Take by mouth.     Calcium-Magnesium-Vitamin D (CITRACAL SLOW RELEASE PO) Take by mouth.     FLOVENT HFA 110 MCG/ACT inhaler Inhale 1 puff into the lungs 2 (two) times daily.     fluticasone (FLONASE) 50 MCG/ACT nasal spray Place into the nose as needed.     hydrochlorothiazide (HYDRODIURIL) 25 MG tablet TAKE 1 TABLET(25 MG) BY MOUTH DAILY (Patient taking differently: Take 12.5 mg by mouth daily. TAKE 1 TABLET(25 MG) BY MOUTH DAILY) 90 tablet 3   levothyroxine (SYNTHROID) 88 MCG tablet TAKE 1 TABLET BY MOUTH DAILY AT 6 AM 90 tablet 3   metoprolol succinate (TOPROL-XL) 25 MG 24 hr tablet TAKE 1 TABLET(25 MG) BY MOUTH DAILY 90 tablet 1    montelukast (SINGULAIR) 10 MG tablet Take 1 tablet (10 mg total) by mouth at bedtime. 90 tablet 1   OVER THE COUNTER MEDICATION Nutrafol     Polyethyl Glycol-Propyl Glycol (SYSTANE OP) Apply to eye.     pravastatin (PRAVACHOL) 10 MG tablet Take 1 tablet (10 mg total) by mouth daily. 90 tablet 3   pregabalin (LYRICA) 50 MG capsule TAKE 1 CAPSULE(50 MG) BY MOUTH TWICE DAILY 60 capsule 3   Propylene Glycol 0.6 % SOLN      RESTASIS 0.05 % ophthalmic emulsion 1 drop 2 (two) times daily.     triamcinolone cream (KENALOG) 0.1 % Apply topically as needed.     Zoledronic Acid (RECLAST IV) Inject into the vein. Last infusion: 10/20/2022.     No current facility-administered medications on file prior to visit.    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   Coronary artery disease 2005   Deviated septum 03/2011   Fibromyalgia 2005   Graves disease 2001   Hepatitis C    History of coronary angiogram 05/2012   Hx of LASIK 2000   Hypertension    Optic disc  hemorrhage 2005   Osteopenia 2005   Osteoporosis    Pneumonia 1994   Proptosis 2011   Restless leg syndrome 2005   Sarcoidosis 1980   Spondylolisthesis    Strabismus 2013   Stroke [facility])    Allergies  Allergen Reactions   Prednisone     Other reaction(s): Other (See Comments) Pain, bloating    Social History   Socioeconomic History   Marital status: Divorced    Spouse name: Not on file   Number of children: 3   Years of education: Not on file   Highest education level: Bachelor's degree (e.g., BA, AB, BS)  Occupational History   Not on file  Tobacco Use   Smoking status: Never    Passive exposure: Past   Smokeless tobacco: Never  Vaping Use   Vaping Use: Never used  Substance and Sexual Activity    Alcohol use: Not Currently    Alcohol/week: 2.0 standard drinks of alcohol    Types: 1 Glasses of wine, 1 Cans of beer per week    Comment: rarely   Drug use: Never   Sexual activity: Not Currently  Other Topics Concern   Not on file  Social History Narrative   Lives alone, has dog-Brooke Sanders.   3 grown children.   2 granddaughters, who live in [place].   Social Determinants of Health   Financial Resource Strain: Low Risk  (11/10/2022)   Overall Financial Resource Strain (CARDIA)    Difficulty of Paying Living Expenses: Not hard at all  Food Insecurity: No Food Insecurity (11/10/2022)   Hunger Vital Sign    Worried About Running Out of Food in the Last Year: Never true    Ran Out of Food in the Last Year: Never true  Transportation Needs: No Transportation Needs (11/10/2022)   PRAPARE - Administrator, Civil Service (Medical): No    Lack of Transportation (Non-Medical): No  Physical Activity: Sufficiently Active (11/10/2022)   Exercise Vital Sign    Days of Exercise per Week: 7 days    Minutes of Exercise per Session: 30 min  Stress: No Stress Concern Present (11/10/2022)   [employer] of Occupational Health - Occupational Stress Questionnaire    Feeling of Stress : Not at all  Recent Concern: Stress - Stress Concern Present (09/05/2022)   [employer] of Occupational Health - Occupational Stress Questionnaire    Feeling of Stress : To some extent  Social Connections: Moderately Isolated (11/10/2022)   Social Connection and Isolation Panel [NHANES]    Frequency of Communication with Friends and Family: More than three times a week    Frequency of Social Gatherings with Friends and Family: More than three times a week    Attends Religious Services: Never    Database administrator or Organizations: Yes    Attends Engineer, structural: More than 4 times per year    Marital Status: Divorced   Vitals:   12/12/22 0943  BP: 118/70  Pulse: 73  Resp: 16  Temp:  98.5 F (36.9 C)  SpO2: 95%   Body mass index is 23.68 kg/m.  Physical Exam Vitals and nursing note reviewed.  Constitutional:      General: She is not in acute distress.    Appearance: She is well-developed.  HENT:     Head: Normocephalic and atraumatic.     Mouth/Throat:     Mouth: Mucous membranes are moist.     Pharynx: Oropharynx is clear.  Eyes:  Conjunctiva/sclera: Conjunctivae normal.  Cardiovascular:     Rate and Rhythm: Normal rate and regular rhythm.     Pulses:          Dorsalis pedis pulses are 2+ on the right side and 2+ on the left side.     Heart sounds: No murmur heard.    Comments: Varicose veins LE, bilateral. Pulmonary:     Effort: Pulmonary effort is normal. No respiratory distress.     Breath sounds: Normal breath sounds.  Abdominal:     Palpations: Abdomen is soft. There is no hepatomegaly or mass.     Tenderness: There is no abdominal tenderness.  Musculoskeletal:       Back:       Legs:  Lymphadenopathy:     Cervical: No cervical adenopathy.  Skin:    General: Skin is warm.     Findings: No erythema or rash.  Neurological:     General: No focal deficit present.     Mental Status: She is alert and oriented to person, place, and time.     Cranial Nerves: No cranial nerve deficit.     Gait: Gait normal.  Psychiatric:        Mood and Affect: Affect normal. Mood is anxious.     Comments: Well groomed, good eye contact.    ASSESSMENT AND PLAN:  Brooke Sanders was seen today for medical management of chronic issues.  Diagnoses and all orders for this visit:  Elevated glucose level  Fibromyalgia -     DULoxetine (CYMBALTA) 20 MG capsule; Take 1 capsule (20 mg total) by mouth daily.  Essential hypertension    No orders of the defined types were placed in this encounter.   No problem-specific Assessment & Plan notes found for this encounter.   Return in about 3 months (around 03/14/2023) for chronic problems.  Marqueta Pulley G. [place],  MD  [facility]. [place] office.

## 2022-12-12 ENCOUNTER — Ambulatory Visit (INDEPENDENT_AMBULATORY_CARE_PROVIDER_SITE_OTHER): Payer: Medicare Other | Admitting: Family Medicine

## 2022-12-12 ENCOUNTER — Encounter: Payer: Self-pay | Admitting: Family Medicine

## 2022-12-12 VITALS — BP 118/70 | HR 73 | Temp 98.5°F | Resp 16 | Ht 60.0 in | Wt 121.2 lb

## 2022-12-12 DIAGNOSIS — R7309 Other abnormal glucose: Secondary | ICD-10-CM | POA: Diagnosis not present

## 2022-12-12 DIAGNOSIS — R5383 Other fatigue: Secondary | ICD-10-CM | POA: Insufficient documentation

## 2022-12-12 DIAGNOSIS — R5382 Chronic fatigue, unspecified: Secondary | ICD-10-CM | POA: Diagnosis not present

## 2022-12-12 DIAGNOSIS — I10 Essential (primary) hypertension: Secondary | ICD-10-CM | POA: Diagnosis not present

## 2022-12-12 DIAGNOSIS — M797 Fibromyalgia: Secondary | ICD-10-CM | POA: Diagnosis not present

## 2022-12-12 LAB — POCT GLYCOSYLATED HEMOGLOBIN (HGB A1C): Hemoglobin A1C: 5 % (ref 4.0–5.6)

## 2022-12-12 MED ORDER — DULOXETINE HCL 20 MG PO CPEP: 20.0000 mg | ORAL_CAPSULE | Freq: Every day | ORAL | 0 refills | Status: DC

## 2022-12-12 NOTE — Patient Instructions (Addendum)
A few things to remember from today's visit:  Elevated glucose level  Fibromyalgia - Plan: DULoxetine (CYMBALTA) 20 MG capsule  Essential hypertension  Duloxetine 20 mg daily started today, I sent just 30 days, let me know when you are going to need refills, next prescription will be 30 mg.  Continue stretching exercises. No changes in Lyrica or rest of your medications.  If you need refills for medications you take chronically, please call your pharmacy. Do not use My Chart to request refills or for acute issues that need immediate attention. If you send a my chart message, it may take a few days to be addressed, specially if I am not in the office.  Please be sure medication list is accurate. If a new problem present, please set up appointment sooner than planned today.

## 2022-12-16 NOTE — Assessment & Plan Note (Signed)
Myalgias seem to be getting worse. She agrees with starting Duloxetine 20 mg daily, we can increase to 30 mg in a few weeks if well tolerated. Some side effects discussed. Continue low impact exercise, good sleep hygiene, and Lyrica 50 mg. F/U in 3 months.

## 2022-12-16 NOTE — Assessment & Plan Note (Signed)
BP adequately controlled. Continue HCTZ 25 mg 12.5 mg daily and Metoprolol succinate 25 mg daily as well as low salt diet. Continue monitoring BP at home. Eye exam is current.

## 2022-12-17 MED ORDER — HYDROCHLOROTHIAZIDE 25 MG PO TABS: 12.5000 mg | ORAL_TABLET | Freq: Every day | ORAL | 2 refills | Status: DC

## 2022-12-17 NOTE — Assessment & Plan Note (Signed)
We discussed possible etiologies: Systemic illness, immunologic,endocrinology,sleep disorder, psychiatric/psychologic, infectious,medications side effects, and idiopathic. Examination today does not suggest a serious process. Some of her chronic medical conditions and medications can be contributing factors. I do not think we need blood work today. Duloxetine added today for fibromyalgia and polyarthralgia may help.

## 2023-01-11 ENCOUNTER — Other Ambulatory Visit: Payer: Self-pay | Admitting: Family Medicine

## 2023-01-11 DIAGNOSIS — M797 Fibromyalgia: Secondary | ICD-10-CM

## 2023-01-12 ENCOUNTER — Other Ambulatory Visit: Payer: Self-pay | Admitting: Family Medicine

## 2023-01-12 DIAGNOSIS — J309 Allergic rhinitis, unspecified: Secondary | ICD-10-CM

## 2023-02-09 ENCOUNTER — Other Ambulatory Visit: Payer: Self-pay | Admitting: Family Medicine

## 2023-02-09 NOTE — Telephone Encounter (Signed)
Last seen 11/2022 Last filled 01/12/23

## 2023-02-21 ENCOUNTER — Ambulatory Visit (INDEPENDENT_AMBULATORY_CARE_PROVIDER_SITE_OTHER): Payer: Medicare Other | Admitting: Cardiology

## 2023-02-21 ENCOUNTER — Encounter (HOSPITAL_BASED_OUTPATIENT_CLINIC_OR_DEPARTMENT_OTHER): Payer: Self-pay | Admitting: Cardiology

## 2023-02-21 VITALS — BP 126/76 | HR 67 | Ht 60.0 in | Wt 124.0 lb

## 2023-02-21 DIAGNOSIS — E7800 Pure hypercholesterolemia, unspecified: Secondary | ICD-10-CM | POA: Diagnosis not present

## 2023-02-21 DIAGNOSIS — I2510 Atherosclerotic heart disease of native coronary artery without angina pectoris: Secondary | ICD-10-CM

## 2023-02-21 DIAGNOSIS — Z8673 Personal history of transient ischemic attack (TIA), and cerebral infarction without residual deficits: Secondary | ICD-10-CM | POA: Diagnosis not present

## 2023-02-21 DIAGNOSIS — I10 Essential (primary) hypertension: Secondary | ICD-10-CM

## 2023-02-21 DIAGNOSIS — Z7189 Other specified counseling: Secondary | ICD-10-CM

## 2023-02-21 NOTE — Progress Notes (Signed)
Cardiology Office Note:  .    Date:  02/21/2023  ID:  Brooke Sanders Brooke Sanders, MRN [id number] PCP: [place], Betty G, MD  [facility] Providers Cardiologist:  Jodelle Red, MD     History of Present Illness: .    Brooke Sanders is a 77 y.o. female with a hx of CAD, sarcoidosis, stroke, hypertension, hyperlipidemia, hypothyroidism, pneumonia, bronchitis, arthritis, Graves disease, fibromyalgia, hepatitis C, optic disc hemorrhage, and restless leg syndrome, who presents for follow-up today. She was initially seen 01/09/2022 as a new consult at the request of [place], Timoteo Expose, MD for the evaluation and management of CAD.   She saw Dr. [place] by video visit on  12/27/2021 and requested a referral to establish with a new cardiologist. She was previously followed by Dr. Dot Sanders, last visit on 06/07/2021, where she was generally doing well from a cardiac standpoint.   Cardiovascular risk factors: Prior clinical ASCVD: Prior stroke around 2001, recovered quickly. Comorbid conditions:  Hypertension- Controlled on metoprolol succinate 25 mg daily and HCTZ 25 mg daily. Hyperlipidemia- On Pravastatin 5 mg daily for a long time.  Hypothyroidism. Metabolic syndrome/Obesity: BMI 23 Chronic inflammatory conditions: Since 1980 sarcoidosis. Fibromyalgia. Arthritis. Mononucleosis in her youth. She sees a rheumatologist, Dr. Corliss Sanders. With barometric pressure changes, she develops swelling, particularly periorbital, which affects her vision. Tobacco use history: never Family history: Her brother has had heart attacks and CABG x2 initially around 79 yo, currently 77 yo. She reports Dementia and Alzheimer's in her family. Prior cardiac testing and/or incidental findings on other testing (ie coronary calcium): CT 2013 showed evidence of CAD, Confirms having a prior Echo and Stress test. Exercise level: Regularly walks 1.5 miles in the morning and evening with her dog. Stretches and  some weight lifting. She denies ever having any form of chest pain or discomfort. The only time she notices shortness of breath is with walking quickly up a hill. Current diet: Monitors her diet. Frequently enjoys green tea, fruit, nuts. Currently using multiple supplements including apple cider vinegar, B and D vitamins.  At her initial visit, she was on pravastatin, last LDL 77 at the time. Discussed goal of <70. She is sensitive to medications; we discussed changing to high intensity statin vs increasing the pravastatin. We started with increasing pravastatin to 10 mg daily. Repeat lipid panel 05/2022 showed LDL 56.  Today, she states she is feeling a little wobbly. This has Sanders occurring more frequently lately and is associated with back pain. She relates this to her history of scoliosis. She has completed PT which helps improve her balance. However, she still has her "wobbly days".   Additionally she complains of intermittent dyspnea that starts with her coughing fits. She has always struggled with coughing fits that won't stop for the longest time, sometimes causing her to choke. Of note, she confirms it feels like something is "turning over or collapsing" in her throat. In the past she has had prior throat obstruction/aspiration.  Since her HCTZ was cut in half by her PCP due to low sodium, she has noticed increased difficulty with managing her dependent edema. Her swelling occurs with barometric pressure changes, associated with lethargic feelings and bloating.  She denies any palpitations, chest pain, lightheadedness, headaches, syncope, orthopnea, or PND.  ROS:  Please see the history of present illness. ROS otherwise negative except as noted.  (+) Gait instability (+) Back pain (+) Shortness of breath (+) Coughing fits (+) LE edema (+) Lethargy (+) Abdominal bloating  Studies Reviewed: Marland Kitchen    EKG Interpretation Date/Time:  Wednesday February 21 2023 16:02:37 EDT Ventricular Rate:   67 PR Interval:  190 QRS Duration:  82 QT Interval:  394 QTC Calculation: 416 R Axis:   16  Text Interpretation: Normal sinus rhythm Confirmed by Jodelle Red [id number]) on 02/21/2023 4:17:58 PM     Physical Exam:    VS:  BP 126/76   Pulse 67   Ht 5' (1.524 m)   Wt 124 lb (56.2 kg)   BMI 24.22 kg/m    Wt Readings from Last 3 Encounters:  02/21/23 124 lb (56.2 kg)  12/12/22 121 lb 4 oz (55 kg)  11/21/22 122 lb 12.8 oz (55.7 kg)    GEN: Well nourished, well developed in no acute distress HEENT: Normal, moist mucous membranes NECK: No JVD CARDIAC: regular rhythm, normal S1 and S2, no rubs or gallops. No murmur. VASCULAR: Radial and DP pulses 2+ bilaterally. No carotid bruits RESPIRATORY:  Clear to auscultation without rales, wheezing or rhonchi  ABDOMEN: Soft, non-tender, non-distended MUSCULOSKELETAL:  Ambulates independently SKIN: Warm and dry, no edema NEUROLOGIC:  Alert and oriented x 3. No focal neuro deficits noted. PSYCHIATRIC:  Normal affect   ASSESSMENT AND PLAN: .    CAD History of CVA Hypercholesterolemia -no history of MI, noted plaque found on prior CT imaging -reports prior stress test/echo were normal, I cannot see results -on pravastatin, last LDL 56. Tolerating pravastatin -reviewed red flag warning signs that need immediate medical attention -continue aspirin   Hypertension: at goal of <130/80 -continue HCTZ   Cardiac risk counseling and prevention recommendations: -recommend heart healthy/Mediterranean diet, with whole grains, fruits, vegetable, fish, lean meats, nuts, and olive oil. Limit salt. -recommend moderate walking, 3-5 times/week for 30-50 minutes each session. Aim for at least 150 minutes.week. Goal should be pace of 3 miles/hours, or walking 1.5 miles in 30 minutes -recommend avoidance of tobacco products. Avoid excess alcohol.  Dispo: Follow-up in 1 year, or sooner as needed.  I,Mathew Stumpf,acting as a Neurosurgeon for [employer], MD.,have documented all relevant documentation on the behalf of Jodelle Red, MD,as directed by  Jodelle Red, MD while in the presence of Jodelle Red, MD.  I, Jodelle Red, MD, have reviewed all documentation for this visit. The documentation on 04/11/23 for the exam, diagnosis, procedures, and orders are all accurate and complete.   Signed, Jodelle Red, MD

## 2023-02-21 NOTE — Patient Instructions (Signed)
Medication Instructions:  Your physician recommends that you continue on your current medications as directed. Please refer to the Current Medication list given to you today.  *If you need a refill on your cardiac medications before your next appointment, please call your pharmacy*  Lab Work: NONE  Testing/Procedures: NONE  Follow-Up: At [facility], you and your health needs are our priority.  As part of our continuing mission to provide you with exceptional heart care, we have created designated Provider Care Teams.  These Care Teams include your primary Cardiologist (physician) and Advanced Practice Providers (APPs -  Physician Assistants and Nurse Practitioners) who all work together to provide you with the care you need, when you need it.  We recommend signing up for the patient portal called "MyChart".  Sign up information is provided on this After Visit Summary.  MyChart is used to connect with patients for Virtual Visits (Telemedicine).  Patients are able to view lab/test results, encounter notes, upcoming appointments, etc.  Non-urgent messages can be sent to your provider as well.   To learn more about what you can do with MyChart, go to [email or web address].    Your next appointment:   12 month(s)  The format for your next appointment:   In Person  Provider:   Jodelle Red, MD or Eliezer Bottom NP

## 2023-02-26 NOTE — Progress Notes (Unsigned)
ACUTE VISIT No chief complaint on file.  HPI: [name] is a 77 y.o. female, who is here today complaining of *** HPI  Review of Systems See other pertinent positives and negatives in HPI.  Current Outpatient Medications on File Prior to Visit  Medication Sig Dispense Refill  . APPLE CIDER VINEGAR PO Take by mouth daily.    Marland Kitchen aspirin 81 MG EC tablet Take 1 tablet by mouth daily.    Marland Kitchen b complex vitamins tablet Take by mouth.    . Calcium-Magnesium-Vitamin D (CITRACAL SLOW RELEASE PO) Take by mouth.    Haywood Pao HFA 110 MCG/ACT inhaler Inhale 1 puff into the lungs 2 (two) times daily.    . fluticasone (FLONASE) 50 MCG/ACT nasal spray Place into the nose as needed.    . hydrochlorothiazide (HYDRODIURIL) 25 MG tablet Take 0.5 tablets (12.5 mg total) by mouth daily. TAKE 1 TABLET(25 MG) BY MOUTH DAILY 45 tablet 2  . levothyroxine (SYNTHROID) 88 MCG tablet TAKE 1 TABLET BY MOUTH DAILY AT 6 AM 90 tablet 3  . metoprolol succinate (TOPROL-XL) 25 MG 24 hr tablet TAKE 1 TABLET(25 MG) BY MOUTH DAILY 90 tablet 1  . montelukast (SINGULAIR) 10 MG tablet TAKE 1 TABLET(10 MG) BY MOUTH AT BEDTIME 90 tablet 3  . OVER THE COUNTER MEDICATION Nutrafol    . Polyethyl Glycol-Propyl Glycol (SYSTANE OP) Apply to eye.    . pravastatin (PRAVACHOL) 10 MG tablet Take 1 tablet (10 mg total) by mouth daily. 90 tablet 3  . pregabalin (LYRICA) 50 MG capsule TAKE 1 CAPSULE(50 MG) BY MOUTH TWICE DAILY 60 capsule 2  . Propylene Glycol 0.6 % SOLN     . RESTASIS 0.05 % ophthalmic emulsion 1 drop 2 (two) times daily.    Marland Kitchen triamcinolone cream (KENALOG) 0.1 % Apply topically as needed.    . Zoledronic Acid (RECLAST IV) Inject into the vein. Last infusion: 10/20/2022.     No current facility-administered medications on file prior to visit.    Past Medical History:  Diagnosis Date  . Allergy 2009  . Arthritis   . Bilateral artificial lens implant 2009  . Bronchitis 1994  . 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  Cataract   . Cervical herniated disc 1986  . Chronic fatigue 2005  . 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 Coronary artery disease 2005  . Deviated septum 03/2011  . Fibromyalgia 2005  . Graves disease 2001  . Hepatitis C   . History of coronary angiogram 05/2012  . Hx of LASIK 2000  . Hypertension   . Optic disc hemorrhage 2005  . Osteopenia 2005  . Osteoporosis   . Pneumonia 1994  . Proptosis 2011  . Restless leg syndrome 2005  . Sarcoidosis 1980  . Spondylolisthesis   . Strabismus 2013  . Stroke [facility])    Allergies  Allergen Reactions  . Prednisone     Other reaction(s): Other (See Comments) Pain, bloating    Social History   Socioeconomic History  . Marital status: Divorced    Spouse name: Not on file  . Number of children: 3  . Years of education: Not on file  . Highest education level: Bachelor's degree (e.g., BA, AB, BS)  Occupational History  . Not on file  Tobacco Use  . Smoking status: Never    Passive exposure: Past  . Smokeless tobacco: Never  Vaping Use  . Vaping status: Never Used  Substance and Sexual Activity  . Alcohol use: Not  Currently    Alcohol/week: 2.0 standard drinks of alcohol    Types: 1 Glasses of wine, 1 Cans of beer per week    Comment: rarely  . Drug use: Never  . Sexual activity: Not Currently  Other Topics Concern  . Not on file  Social History Narrative   Lives alone, has dog-Brooke Sanders.   3 grown children.   2 granddaughters, who live in [place].   Social Determinants of Health   Financial Resource Strain: Low Risk  (11/10/2022)   Overall Financial Resource Strain (CARDIA)   . Difficulty of Paying Living Expenses: Not hard at all  Food Insecurity: No Food Insecurity (11/10/2022)   Hunger Vital Sign   . Worried About Programme researcher, broadcasting/film/video in the Last Year: Never true   . Ran Out of Food in the Last Year: Never true  Transportation Needs: No Transportation Needs (11/10/2022)   PRAPARE - Transportation   . Lack of Transportation  (Medical): No   . Lack of Transportation (Non-Medical): No  Physical Activity: Sufficiently Active (11/10/2022)   Exercise Vital Sign   . Days of Exercise per Week: 7 days   . Minutes of Exercise per Session: 30 min  Stress: No Stress Concern Present (11/10/2022)   [employer] of Occupational Health - Occupational Stress Questionnaire   . Feeling of Stress : Not at all  Recent Concern: Stress - Stress Concern Present (09/05/2022)   [employer] of Occupational Health - Occupational Stress Questionnaire   . Feeling of Stress : To some extent  Social Connections: Moderately Isolated (11/10/2022)   Social Connection and Isolation Panel [NHANES]   . Frequency of Communication with Friends and Family: More than three times a week   . Frequency of Social Gatherings with Friends and Family: More than three times a week   . Attends Religious Services: Never   . Active Member of Clubs or Organizations: Yes   . Attends Banker Meetings: More than 4 times per year   . Marital Status: Divorced    There were no vitals filed for this visit. There is no height or weight on file to calculate BMI.  Physical Exam  ASSESSMENT AND PLAN: There are no diagnoses linked to this encounter.  No follow-ups on file.  Brooke Song G. [place], MD  [facility]. [place] office.  Discharge Instructions   None

## 2023-02-27 ENCOUNTER — Ambulatory Visit (INDEPENDENT_AMBULATORY_CARE_PROVIDER_SITE_OTHER): Payer: Medicare Other | Admitting: Family Medicine

## 2023-02-27 ENCOUNTER — Encounter: Payer: Self-pay | Admitting: Family Medicine

## 2023-02-27 VITALS — BP 120/70 | HR 64 | Temp 97.8°F | Resp 16 | Ht 60.0 in | Wt 124.1 lb

## 2023-02-27 DIAGNOSIS — R490 Dysphonia: Secondary | ICD-10-CM

## 2023-02-27 DIAGNOSIS — R053 Chronic cough: Secondary | ICD-10-CM | POA: Diagnosis not present

## 2023-02-27 DIAGNOSIS — M797 Fibromyalgia: Secondary | ICD-10-CM | POA: Diagnosis not present

## 2023-02-27 DIAGNOSIS — J309 Allergic rhinitis, unspecified: Secondary | ICD-10-CM | POA: Diagnosis not present

## 2023-02-27 DIAGNOSIS — Z86010 Personal history of colonic polyps: Secondary | ICD-10-CM

## 2023-02-27 DIAGNOSIS — K635 Polyp of colon: Secondary | ICD-10-CM

## 2023-02-27 MED ORDER — ALBUTEROL SULFATE HFA 108 (90 BASE) MCG/ACT IN AERS: 2.0000 | INHALATION_SPRAY | Freq: Four times a day (QID) | RESPIRATORY_TRACT | 0 refills | Status: DC | PRN

## 2023-02-27 NOTE — Patient Instructions (Signed)
A few things to remember from today's visit:  Chronic cough - Plan: CT Chest Wo Contrast, Ambulatory referral to ENT  Dysphonia - Plan: Ambulatory referral to ENT  Polyp of colon, unspecified part of colon, unspecified type - Plan: Ambulatory referral to Gastroenterology  Referrals to ENT, chest CT,and gastro placed. No changes today except for adding Albuterol in to try with coughing spells.  If you need refills for medications you take chronically, please call your pharmacy. Do not use My Chart to request refills or for acute issues that need immediate attention. If you send a my chart message, it may take a few days to be addressed, specially if I am not in the office.  Please be sure medication list is accurate. If a new problem present, please set up appointment sooner than planned today.

## 2023-03-01 NOTE — Assessment & Plan Note (Signed)
This could be related to GERD or allergies. She states that she has not had laryngoscopic exam. ENT referral placed.

## 2023-03-01 NOTE — Assessment & Plan Note (Signed)
Cologuard is not an options. Stressed the importance of following GI recommendations. She can express her concerns about colonoscopy frequency with gastroenterologist, referral placed.

## 2023-03-01 NOTE — Assessment & Plan Note (Addendum)
10+ years ago. Possible causes discussed. She has followed with ENT and requesting new referral. Some of her chronic co morbilities could be contributing factors. She prefers to try Albuterol in instead Flovent 110 mcg, Rx sent to use 1-2 puff qid prn. She has not had a chest CT done. ENT referral and chest CT order placed.

## 2023-03-01 NOTE — Assessment & Plan Note (Addendum)
Stable. Recommend taking Singulair 10 mg during seasons she feels symptoms are worse. We discussed side effects. Nasal saline irrigations as needed recommended. Flonase nasal spray daily as needed to continue.

## 2023-03-01 NOTE — Assessment & Plan Note (Addendum)
Did not tolerate Duloxetine. For now continue Lyrica 50 mg bid. Continue low impact exercise and good sleep hygiene. Fall precautions. Handicap sticker provided. F/U in 3-4 months.

## 2023-03-15 ENCOUNTER — Ambulatory Visit (HOSPITAL_BASED_OUTPATIENT_CLINIC_OR_DEPARTMENT_OTHER): Payer: Medicare Other

## 2023-03-28 ENCOUNTER — Ambulatory Visit: Payer: Medicare Other | Admitting: Family Medicine

## 2023-04-08 ENCOUNTER — Ambulatory Visit (HOSPITAL_BASED_OUTPATIENT_CLINIC_OR_DEPARTMENT_OTHER)
Admission: RE | Admit: 2023-04-08 | Discharge: 2023-04-08 | Disposition: A | Discharge status: Home / Self Care | Payer: Medicare Other | Source: Ambulatory Visit | Attending: Family Medicine | Admitting: Family Medicine

## 2023-04-08 DIAGNOSIS — R053 Chronic cough: Secondary | ICD-10-CM | POA: Diagnosis present

## 2023-04-11 ENCOUNTER — Encounter (HOSPITAL_BASED_OUTPATIENT_CLINIC_OR_DEPARTMENT_OTHER): Payer: Self-pay | Admitting: Cardiology

## 2023-04-12 ENCOUNTER — Other Ambulatory Visit: Payer: Self-pay | Admitting: Cardiology

## 2023-04-23 ENCOUNTER — Encounter: Payer: Self-pay | Admitting: Family Medicine

## 2023-04-23 DIAGNOSIS — I700 Atherosclerosis of aorta: Secondary | ICD-10-CM | POA: Insufficient documentation
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
## 2023-05-15 ENCOUNTER — Other Ambulatory Visit: Payer: Self-pay | Admitting: *Deleted

## 2023-05-15 MED ORDER — PRAVASTATIN SODIUM 10 MG PO TABS: 10.0000 mg | ORAL_TABLET | Freq: Every day | ORAL | 3 refills | Status: DC

## 2023-05-21 ENCOUNTER — Institutional Professional Consult (permissible substitution) (INDEPENDENT_AMBULATORY_CARE_PROVIDER_SITE_OTHER): Payer: Medicare Other | Admitting: Otolaryngology

## 2023-05-22 HISTORY — PX: SKIN BIOPSY: SHX1

## 2023-05-23 ENCOUNTER — Ambulatory Visit: Payer: Medicare Other | Attending: Physician Assistant | Admitting: Physician Assistant

## 2023-05-23 ENCOUNTER — Encounter: Payer: Self-pay | Admitting: Physician Assistant

## 2023-05-23 ENCOUNTER — Telehealth: Payer: Self-pay | Admitting: Pharmacist

## 2023-05-23 VITALS — BP 128/76 | HR 65 | Resp 15 | Ht 60.0 in | Wt 123.2 lb

## 2023-05-23 DIAGNOSIS — M19042 Primary osteoarthritis, left hand: Secondary | ICD-10-CM | POA: Diagnosis present

## 2023-05-23 DIAGNOSIS — Z8679 Personal history of other diseases of the circulatory system: Secondary | ICD-10-CM | POA: Diagnosis present

## 2023-05-23 DIAGNOSIS — L659 Nonscarring hair loss, unspecified: Secondary | ICD-10-CM

## 2023-05-23 DIAGNOSIS — M170 Bilateral primary osteoarthritis of knee: Secondary | ICD-10-CM

## 2023-05-23 DIAGNOSIS — E785 Hyperlipidemia, unspecified: Secondary | ICD-10-CM | POA: Diagnosis present

## 2023-05-23 DIAGNOSIS — M47816 Spondylosis without myelopathy or radiculopathy, lumbar region: Secondary | ICD-10-CM | POA: Diagnosis present

## 2023-05-23 DIAGNOSIS — M7522 Bicipital tendinitis, left shoulder: Secondary | ICD-10-CM | POA: Insufficient documentation

## 2023-05-23 DIAGNOSIS — M797 Fibromyalgia: Secondary | ICD-10-CM | POA: Diagnosis present

## 2023-05-23 DIAGNOSIS — R5383 Other fatigue: Secondary | ICD-10-CM | POA: Diagnosis present

## 2023-05-23 DIAGNOSIS — I10 Essential (primary) hypertension: Secondary | ICD-10-CM

## 2023-05-23 DIAGNOSIS — C44729 Squamous cell carcinoma of skin of left lower limb, including hip: Secondary | ICD-10-CM

## 2023-05-23 DIAGNOSIS — M5030 Other cervical disc degeneration, unspecified cervical region: Secondary | ICD-10-CM

## 2023-05-23 DIAGNOSIS — M19041 Primary osteoarthritis, right hand: Secondary | ICD-10-CM

## 2023-05-23 DIAGNOSIS — Z8781 Personal history of (healed) traumatic fracture: Secondary | ICD-10-CM

## 2023-05-23 DIAGNOSIS — Z8639 Personal history of other endocrine, nutritional and metabolic disease: Secondary | ICD-10-CM | POA: Diagnosis present

## 2023-05-23 DIAGNOSIS — M810 Age-related osteoporosis without current pathological fracture: Secondary | ICD-10-CM | POA: Diagnosis present

## 2023-05-23 DIAGNOSIS — D869 Sarcoidosis, unspecified: Secondary | ICD-10-CM | POA: Diagnosis present

## 2023-05-23 DIAGNOSIS — M7521 Bicipital tendinitis, right shoulder: Secondary | ICD-10-CM

## 2023-05-23 DIAGNOSIS — M4124 Other idiopathic scoliosis, thoracic region: Secondary | ICD-10-CM | POA: Diagnosis present

## 2023-05-23 DIAGNOSIS — M1120 Other chondrocalcinosis, unspecified site: Secondary | ICD-10-CM

## 2023-05-23 DIAGNOSIS — Z5181 Encounter for therapeutic drug level monitoring: Secondary | ICD-10-CM

## 2023-05-23 DIAGNOSIS — E559 Vitamin D deficiency, unspecified: Secondary | ICD-10-CM

## 2023-05-23 LAB — COMPLETE METABOLIC PANEL WITH GFR
AG Ratio: 1.9 (calc) (ref 1.0–2.5)
ALT: 16 U/L (ref 6–29)
AST: 24 U/L (ref 10–35)
Albumin: 4.6 g/dL (ref 3.6–5.1)
Alkaline phosphatase (APISO): 53 U/L (ref 37–153)
BUN: 18 mg/dL (ref 7–25)
CO2: 28 mmol/L (ref 20–32)
Calcium: 9.6 mg/dL (ref 8.6–10.4)
Chloride: 93 mmol/L — ABNORMAL LOW (ref 98–110)
Creat: 0.79 mg/dL (ref 0.60–1.00)
Globulin: 2.4 g/dL (ref 1.9–3.7)
Glucose, Bld: 94 mg/dL (ref 65–99)
Potassium: 4.8 mmol/L (ref 3.5–5.3)
Sodium: 131 mmol/L — ABNORMAL LOW (ref 135–146)
Total Bilirubin: 0.6 mg/dL (ref 0.2–1.2)
Total Protein: 7 g/dL (ref 6.1–8.1)
eGFR: 77 mL/min/{1.73_m2} (ref >=60)

## 2023-05-23 LAB — VITAMIN D 25 HYDROXY (VIT D DEFICIENCY, FRACTURES): Vit D, 25-Hydroxy: 62 ng/mL (ref 30–100)

## 2023-05-23 NOTE — Progress Notes (Signed)
Pharmacy Note  Subjective:  Patient presents today to [facility] for follow up office visit.  Patient was seen by the pharmacist for counseling on Forteo.  Has received five Reclast infusions  Objective: CMP     Component Value Date/Time   NA 131 (L) 10/02/2022 1339   K 4.1 10/02/2022 1339   CL 91 (L) 10/02/2022 1339   CO2 32 10/02/2022 1339   GLUCOSE 164 (H) 10/02/2022 1339   BUN 19 10/02/2022 1339   CREATININE 0.73 10/02/2022 1339   CALCIUM 9.6 10/02/2022 1339   PROT 7.0 10/02/2022 1339   ALBUMIN 4.5 06/12/2022 0938   AST 23 10/02/2022 1339   ALT 17 10/02/2022 1339   ALKPHOS 41 06/12/2022 0938   BILITOT 0.5 10/02/2022 1339   GFRNONAA >60 11/03/2021 2016   GFRNONAA 84 11/11/2020 1110   GFRAA 98 11/11/2020 1110   Vitamin D Lab Results  Component Value Date   VD25OH 64 10/02/2022   DEXA scan: 09/28/2022 -  BMD measured at Forearm Radius 33% is 0.633 g/cm2 with a T-score of -2.8.  Assessment/Plan:    Counseled patient on purpose, proper use, storage, and adverse effects of Forteo. Discussed the Forteo is PTH peptide agonist which results in stimulation of osteoblast and bone mass. Reviewed that Forteo treatment course is for 2 years. Discussed that pen is stable for 28 days after first use and must be stored in fridge after each dose. Counseled patient that Sharren Bridge is a medication that must be injected once daily and that prescription for pen needles will be sent with Forteo prescription.  Advised patient to continue taking calcium 1200 mg daily and vitamin D supplement.  Reviewed the most common adverse effects of Forteo including orthostatic hypotension, GI upset, injection site reaction, and joint pain/arthralgia.  Discussed injection site reaction management and injection site locations. Discussed alternating injection site.  Discussed management of dizziness (which can occur for up to 4 hours after injection) including adequate hydration, slowly rising from  bed/chairs to prevent fals, and taking Forteo at night if needed.  We walked through [employer] administration with demo pen and pen needle.  Discussed appropriate disposal of sharps.     Forteo prescription pending baseline labs and pharmacy benefits investigation.   All questions encouraged and answered.   She does not qualify for PAP based on household size and income for one person.  She may be a candidate for Evenity but does have history of stroke in her 30s. She has had no cardiac events since then. Evenity reserved as last line but would be aggressive for her  Chesley Mires, PharmD, MPH, BCPS, CPP Clinical Pharmacist (Rheumatology and Pulmonology)

## 2023-05-23 NOTE — Telephone Encounter (Signed)
Please start Forteo BIV  Dose: once daily (1 pen has 28 day BUD)  PAP not completed because patient does not qualify based on income limit for household of one as discussed today at [facility]. Will investigate cost regardless  Discussed in detail the anticipated changes to Medicare rx plans in 2025, including $2000 max OOP and $167 per month payment plan opt-in.  Chesley Mires, PharmD, MPH, BCPS, CPP Clinical Pharmacist (Rheumatology and Pulmonology)

## 2023-05-24 NOTE — Progress Notes (Signed)
Sodium and chloride remain chronically low. Please clarify if these levels are being followed by PCP or another provider? Please forward results. Rest of CMP WNL.  Vitamin D WNL.

## 2023-05-25 ENCOUNTER — Other Ambulatory Visit (HOSPITAL_COMMUNITY): Payer: Self-pay

## 2023-05-25 ENCOUNTER — Telehealth: Payer: Self-pay | Admitting: Family Medicine

## 2023-05-25 NOTE — Telephone Encounter (Signed)
Submitted a Prior Authorization request to [facility] for FORTEO via CoverMyMeds. Will update once we receive a response.  Key: [id number]  Chesley Mires, PharmD, MPH, BCPS, CPP Clinical Pharmacist (Rheumatology and Pulmonology)

## 2023-05-25 NOTE — Telephone Encounter (Signed)
Pt is calling checking on sodium and chloride result that rheumatologist forward to dr [place] on 05-24-2023

## 2023-05-25 NOTE — Telephone Encounter (Signed)
Received notification from [facility] regarding a prior authorization for FORTEO. Authorization has been APPROVED from 05/25/23 to 07/23/24.   Per test claim, copay for 28 days supply is $[phone]. Brand preferred.  Submitted a Prior Authorization request to [facility] for Teriparatide via CoverMyMeds. Will update once we receive a response.  Key: [id number]

## 2023-05-29 ENCOUNTER — Ambulatory Visit (INDEPENDENT_AMBULATORY_CARE_PROVIDER_SITE_OTHER): Payer: Medicare Other | Admitting: Otolaryngology

## 2023-05-29 ENCOUNTER — Encounter (INDEPENDENT_AMBULATORY_CARE_PROVIDER_SITE_OTHER): Payer: Self-pay

## 2023-05-29 VITALS — Ht 60.0 in | Wt 120.0 lb

## 2023-05-29 DIAGNOSIS — R053 Chronic cough: Secondary | ICD-10-CM | POA: Diagnosis not present

## 2023-05-29 DIAGNOSIS — J3089 Other allergic rhinitis: Secondary | ICD-10-CM
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
## 2023-06-08 ENCOUNTER — Other Ambulatory Visit (HOSPITAL_COMMUNITY): Payer: Self-pay

## 2023-06-08 NOTE — Telephone Encounter (Signed)
Sodium and chloride have been mildly low for some time now and otherwise stable. Medications like hydrochlorothiazide can aggravate problem, so if at some point numbers start getting worse, we need to consider discontinuing medication. Thanks, BJ

## 2023-06-08 NOTE — Telephone Encounter (Signed)
I called and spoke with patient. We went over the information below & she verbalized understanding.  

## 2023-06-08 NOTE — Telephone Encounter (Addendum)
Received fax from [facility] stating that there is a previously approval for TERIPARATIDE from 05/25/2023 to 07/23/2024  Copay for generic is $739.91    Chesley Mires, PharmD, MPH, BCPS, CPP Clinical Pharmacist (Rheumatology and Pulmonology)

## 2023-07-11 ENCOUNTER — Other Ambulatory Visit: Payer: Self-pay | Admitting: Family Medicine

## 2023-07-11 DIAGNOSIS — I10 Essential (primary) hypertension: Secondary | ICD-10-CM

## 2023-07-13 ENCOUNTER — Other Ambulatory Visit: Payer: Self-pay | Admitting: Family Medicine

## 2023-08-06 ENCOUNTER — Inpatient Hospital Stay (HOSPITAL_BASED_OUTPATIENT_CLINIC_OR_DEPARTMENT_OTHER)
Admission: EM | Admit: 2023-08-06 | Discharge: 2023-08-25 | DRG: 329 | Disposition: E | Payer: Medicare Other | Source: Ambulatory Visit | Attending: General Surgery | Admitting: General Surgery

## 2023-08-06 ENCOUNTER — Encounter (HOSPITAL_BASED_OUTPATIENT_CLINIC_OR_DEPARTMENT_OTHER): Payer: Self-pay

## 2023-08-06 ENCOUNTER — Emergency Department (HOSPITAL_BASED_OUTPATIENT_CLINIC_OR_DEPARTMENT_OTHER): Payer: Medicare Other

## 2023-08-06 ENCOUNTER — Other Ambulatory Visit: Payer: Self-pay

## 2023-08-06 DIAGNOSIS — D696 Thrombocytopenia, unspecified: Secondary | ICD-10-CM | POA: Diagnosis not present

## 2023-08-06 DIAGNOSIS — K631 Perforation of intestine (nontraumatic): Secondary | ICD-10-CM | POA: Diagnosis not present

## 2023-08-06 DIAGNOSIS — Z7982 Long term (current) use of aspirin: Secondary | ICD-10-CM

## 2023-08-06 DIAGNOSIS — Z515 Encounter for palliative care: Secondary | ICD-10-CM

## 2023-08-06 DIAGNOSIS — E875 Hyperkalemia: Secondary | ICD-10-CM | POA: Diagnosis not present

## 2023-08-06 DIAGNOSIS — I10 Essential (primary) hypertension: Secondary | ICD-10-CM | POA: Diagnosis present

## 2023-08-06 DIAGNOSIS — Z6830 Body mass index (BMI) 30.0-30.9, adult: Secondary | ICD-10-CM

## 2023-08-06 DIAGNOSIS — R7401 Elevation of levels of liver transaminase levels: Secondary | ICD-10-CM | POA: Diagnosis not present

## 2023-08-06 DIAGNOSIS — Z9911 Dependence on respirator [ventilator] status: Secondary | ICD-10-CM

## 2023-08-06 DIAGNOSIS — I4719 Other supraventricular tachycardia: Secondary | ICD-10-CM | POA: Diagnosis not present

## 2023-08-06 DIAGNOSIS — Z66 Do not resuscitate: Secondary | ICD-10-CM | POA: Diagnosis not present

## 2023-08-06 DIAGNOSIS — Y92238 Other place in hospital as the place of occurrence of the external cause: Secondary | ICD-10-CM | POA: Diagnosis not present

## 2023-08-06 DIAGNOSIS — E0500 Thyrotoxicosis with diffuse goiter without thyrotoxic crisis or storm: Secondary | ICD-10-CM | POA: Diagnosis present

## 2023-08-06 DIAGNOSIS — J310 Chronic rhinitis: Secondary | ICD-10-CM | POA: Diagnosis present

## 2023-08-06 DIAGNOSIS — N179 Acute kidney failure, unspecified: Secondary | ICD-10-CM | POA: Diagnosis not present

## 2023-08-06 DIAGNOSIS — R6521 Severe sepsis with septic shock: Secondary | ICD-10-CM | POA: Diagnosis not present

## 2023-08-06 DIAGNOSIS — Z8701 Personal history of pneumonia (recurrent): Secondary | ICD-10-CM

## 2023-08-06 DIAGNOSIS — E440 Moderate protein-calorie malnutrition: Secondary | ICD-10-CM | POA: Diagnosis not present

## 2023-08-06 DIAGNOSIS — G2581 Restless legs syndrome: Secondary | ICD-10-CM | POA: Diagnosis present

## 2023-08-06 DIAGNOSIS — A419 Sepsis, unspecified organism: Secondary | ICD-10-CM | POA: Diagnosis not present

## 2023-08-06 DIAGNOSIS — D62 Acute posthemorrhagic anemia: Secondary | ICD-10-CM | POA: Diagnosis not present

## 2023-08-06 DIAGNOSIS — Z85828 Personal history of other malignant neoplasm of skin: Secondary | ICD-10-CM

## 2023-08-06 DIAGNOSIS — Y832 Surgical operation with anastomosis, bypass or graft as the cause of abnormal reaction of the patient, or of later complication, without mention of misadventure at the time of the procedure: Secondary | ICD-10-CM | POA: Diagnosis not present

## 2023-08-06 DIAGNOSIS — R188 Other ascites: Secondary | ICD-10-CM | POA: Diagnosis present

## 2023-08-06 DIAGNOSIS — J9601 Acute respiratory failure with hypoxia: Secondary | ICD-10-CM

## 2023-08-06 DIAGNOSIS — M810 Age-related osteoporosis without current pathological fracture: Secondary | ICD-10-CM | POA: Diagnosis present

## 2023-08-06 DIAGNOSIS — Z821 Family history of blindness and visual loss: Secondary | ICD-10-CM

## 2023-08-06 DIAGNOSIS — R571 Hypovolemic shock: Secondary | ICD-10-CM | POA: Diagnosis not present

## 2023-08-06 DIAGNOSIS — Z8619 Personal history of other infectious and parasitic diseases: Secondary | ICD-10-CM

## 2023-08-06 DIAGNOSIS — K449 Diaphragmatic hernia without obstruction or gangrene: Secondary | ICD-10-CM | POA: Diagnosis present

## 2023-08-06 DIAGNOSIS — K558 Other vascular disorders of intestine: Secondary | ICD-10-CM | POA: Diagnosis not present

## 2023-08-06 DIAGNOSIS — K9189 Other postprocedural complications and disorders of digestive system: Secondary | ICD-10-CM | POA: Diagnosis not present

## 2023-08-06 DIAGNOSIS — K56609 Unspecified intestinal obstruction, unspecified as to partial versus complete obstruction: Principal | ICD-10-CM | POA: Diagnosis present

## 2023-08-06 DIAGNOSIS — Z90710 Acquired absence of both cervix and uterus: Secondary | ICD-10-CM

## 2023-08-06 DIAGNOSIS — Z8669 Personal history of other diseases of the nervous system and sense organs: Secondary | ICD-10-CM

## 2023-08-06 DIAGNOSIS — E7800 Pure hypercholesterolemia, unspecified: Secondary | ICD-10-CM | POA: Diagnosis not present

## 2023-08-06 DIAGNOSIS — K661 Hemoperitoneum: Secondary | ICD-10-CM | POA: Diagnosis not present

## 2023-08-06 DIAGNOSIS — Z8719 Personal history of other diseases of the digestive system: Secondary | ICD-10-CM

## 2023-08-06 DIAGNOSIS — H509 Unspecified strabismus: Secondary | ICD-10-CM | POA: Diagnosis present

## 2023-08-06 DIAGNOSIS — E890 Postprocedural hypothyroidism: Secondary | ICD-10-CM | POA: Diagnosis present

## 2023-08-06 DIAGNOSIS — J95821 Acute postprocedural respiratory failure: Secondary | ICD-10-CM | POA: Diagnosis not present

## 2023-08-06 DIAGNOSIS — R54 Age-related physical debility: Secondary | ICD-10-CM | POA: Diagnosis present

## 2023-08-06 DIAGNOSIS — I2510 Atherosclerotic heart disease of native coronary artery without angina pectoris: Secondary | ICD-10-CM | POA: Diagnosis present

## 2023-08-06 DIAGNOSIS — Z8042 Family history of malignant neoplasm of prostate: Secondary | ICD-10-CM

## 2023-08-06 DIAGNOSIS — Z79890 Hormone replacement therapy (postmenopausal): Secondary | ICD-10-CM

## 2023-08-06 DIAGNOSIS — Z8249 Family history of ischemic heart disease and other diseases of the circulatory system: Secondary | ICD-10-CM

## 2023-08-06 DIAGNOSIS — K91840 Postprocedural hemorrhage and hematoma of a digestive system organ or structure following a digestive system procedure: Secondary | ICD-10-CM | POA: Diagnosis not present

## 2023-08-06 DIAGNOSIS — K5652 Intestinal adhesions [bands] with complete obstruction: Secondary | ICD-10-CM | POA: Diagnosis present

## 2023-08-06 DIAGNOSIS — E785 Hyperlipidemia, unspecified: Secondary | ICD-10-CM | POA: Diagnosis present

## 2023-08-06 DIAGNOSIS — Z8709 Personal history of other diseases of the respiratory system: Secondary | ICD-10-CM

## 2023-08-06 DIAGNOSIS — I4891 Unspecified atrial fibrillation: Secondary | ICD-10-CM | POA: Diagnosis not present

## 2023-08-06 DIAGNOSIS — I471 Supraventricular tachycardia, unspecified: Secondary | ICD-10-CM | POA: Diagnosis not present

## 2023-08-06 DIAGNOSIS — Z9049 Acquired absence of other specified parts of digestive tract: Secondary | ICD-10-CM

## 2023-08-06 DIAGNOSIS — S31109A Unspecified open wound of abdominal wall, unspecified quadrant without penetration into peritoneal cavity, initial encounter: Secondary | ICD-10-CM | POA: Diagnosis not present

## 2023-08-06 DIAGNOSIS — R579 Shock, unspecified: Secondary | ICD-10-CM | POA: Diagnosis not present

## 2023-08-06 DIAGNOSIS — E872 Acidosis, unspecified: Secondary | ICD-10-CM | POA: Diagnosis not present

## 2023-08-06 DIAGNOSIS — J9811 Atelectasis: Secondary | ICD-10-CM | POA: Diagnosis not present

## 2023-08-06 DIAGNOSIS — I700 Atherosclerosis of aorta: Secondary | ICD-10-CM | POA: Diagnosis present

## 2023-08-06 DIAGNOSIS — Z9889 Other specified postprocedural states: Secondary | ICD-10-CM

## 2023-08-06 DIAGNOSIS — R0689 Other abnormalities of breathing: Secondary | ICD-10-CM

## 2023-08-06 DIAGNOSIS — Z7189 Other specified counseling: Secondary | ICD-10-CM | POA: Diagnosis not present

## 2023-08-06 DIAGNOSIS — Z79899 Other long term (current) drug therapy: Secondary | ICD-10-CM

## 2023-08-06 DIAGNOSIS — Z888 Allergy status to other drugs, medicaments and biological substances status: Secondary | ICD-10-CM

## 2023-08-06 DIAGNOSIS — Z8679 Personal history of other diseases of the circulatory system: Secondary | ICD-10-CM

## 2023-08-06 DIAGNOSIS — Z8673 Personal history of transient ischemic attack (TIA), and cerebral infarction without residual deficits: Secondary | ICD-10-CM

## 2023-08-06 DIAGNOSIS — Z823 Family history of stroke: Secondary | ICD-10-CM

## 2023-08-06 DIAGNOSIS — E039 Hypothyroidism, unspecified: Secondary | ICD-10-CM | POA: Diagnosis not present

## 2023-08-06 DIAGNOSIS — R34 Anuria and oliguria: Secondary | ICD-10-CM | POA: Diagnosis not present

## 2023-08-06 DIAGNOSIS — Z947 Corneal transplant status: Secondary | ICD-10-CM

## 2023-08-06 DIAGNOSIS — G629 Polyneuropathy, unspecified: Secondary | ICD-10-CM | POA: Diagnosis present

## 2023-08-06 DIAGNOSIS — M797 Fibromyalgia: Secondary | ICD-10-CM | POA: Diagnosis present

## 2023-08-06 DIAGNOSIS — D869 Sarcoidosis, unspecified: Secondary | ICD-10-CM | POA: Diagnosis present

## 2023-08-06 LAB — LIPASE, BLOOD: Lipase: 15 U/L (ref 11–51)

## 2023-08-06 LAB — CBC WITH DIFFERENTIAL/PLATELET
Abs Immature Granulocytes: 0.01 10*3/uL (ref 0.00–0.07)
Abs Immature Granulocytes: 0.01 10*3/uL (ref 0.00–0.07)
Basophils Absolute: 0 10*3/uL (ref 0.0–0.1)
Basophils Absolute: 0 10*3/uL (ref 0.0–0.1)
Basophils Relative: 0 %
Basophils Relative: 1 %
Eosinophils Absolute: 0 10*3/uL (ref 0.0–0.5)
Eosinophils Absolute: 0 10*3/uL (ref 0.0–0.5)
Eosinophils Relative: 0 %
Eosinophils Relative: 0 %
HCT: 39.2 % (ref 36.0–46.0)
HCT: 39.4 % (ref 36.0–46.0)
Hemoglobin: 13.6 g/dL (ref 12.0–15.0)
Hemoglobin: 12.9 g/dL (ref 12.0–15.0)
Immature Granulocytes: 0 %
Immature Granulocytes: 0 %
Lymphocytes Relative: 9 %
Lymphocytes Relative: 16 %
Lymphs Abs: 0.5 10*3/uL — ABNORMAL LOW (ref 0.7–4.0)
Lymphs Abs: 0.8 10*3/uL (ref 0.7–4.0)
MCH: 31.2 pg (ref 26.0–34.0)
MCH: 30.9 pg (ref 26.0–34.0)
MCHC: 34.7 g/dL (ref 30.0–36.0)
MCHC: 32.7 g/dL (ref 30.0–36.0)
MCV: 89.9 fL (ref 80.0–100.0)
MCV: 94.3 fL (ref 80.0–100.0)
Monocytes Absolute: 1.2 10*3/uL — ABNORMAL HIGH (ref 0.1–1.0)
Monocytes Absolute: 1.2 10*3/uL — ABNORMAL HIGH (ref 0.1–1.0)
Monocytes Relative: 22 %
Monocytes Relative: 22 %
Neutro Abs: 3.7 10*3/uL (ref 1.7–7.7)
Neutro Abs: 3.2 10*3/uL (ref 1.7–7.7)
Neutrophils Relative %: 69 %
Neutrophils Relative %: 61 %
Platelets: 189 10*3/uL (ref 150–400)
Platelets: 182 10*3/uL (ref 150–400)
RBC: 4.36 MIL/uL (ref 3.87–5.11)
RBC: 4.18 MIL/uL (ref 3.87–5.11)
RDW: 12.8 % (ref 11.5–15.5)
RDW: 12.8 % (ref 11.5–15.5)
WBC: 5.4 10*3/uL (ref 4.0–10.5)
WBC: 5.3 10*3/uL (ref 4.0–10.5)
nRBC: 0 % (ref 0.0–0.2)
nRBC: 0 % (ref 0.0–0.2)

## 2023-08-06 LAB — COMPREHENSIVE METABOLIC PANEL
ALT: 12 U/L (ref 0–44)
ALT: 15 U/L (ref 0–44)
AST: 15 U/L (ref 15–41)
AST: 17 U/L (ref 15–41)
Albumin: 4.2 g/dL (ref 3.5–5.0)
Albumin: 3.6 g/dL (ref 3.5–5.0)
Alkaline Phosphatase: 38 U/L (ref 38–126)
Alkaline Phosphatase: 37 U/L — ABNORMAL LOW (ref 38–126)
Anion gap: 12 (ref 5–15)
Anion gap: 10 (ref 5–15)
BUN: 26 mg/dL — ABNORMAL HIGH (ref 8–23)
BUN: 23 mg/dL (ref 8–23)
CO2: 27 mmol/L (ref 22–32)
CO2: 24 mmol/L (ref 22–32)
Calcium: 9.3 mg/dL (ref 8.9–10.3)
Calcium: 8.3 mg/dL — ABNORMAL LOW (ref 8.9–10.3)
Chloride: 98 mmol/L (ref 98–111)
Chloride: 103 mmol/L (ref 98–111)
Creatinine, Ser: 0.71 mg/dL (ref 0.44–1.00)
Creatinine, Ser: 0.63 mg/dL (ref 0.44–1.00)
GFR, Estimated: >60 mL/min (ref >60)
GFR, Estimated: >60 mL/min (ref >60)
Glucose, Bld: 136 mg/dL — ABNORMAL HIGH (ref 70–99)
Glucose, Bld: 118 mg/dL — ABNORMAL HIGH (ref 70–99)
Potassium: 4.1 mmol/L (ref 3.5–5.1)
Potassium: 4 mmol/L (ref 3.5–5.1)
Sodium: 137 mmol/L (ref 135–145)
Sodium: 137 mmol/L (ref 135–145)
Total Bilirubin: 1.1 mg/dL (ref 0.0–1.2)
Total Bilirubin: 0.9 mg/dL (ref 0.0–1.2)
Total Protein: 6.9 g/dL (ref 6.5–8.1)
Total Protein: 6.6 g/dL (ref 6.5–8.1)

## 2023-08-06 LAB — TSH: TSH: 0.426 u[IU]/mL (ref 0.350–4.500)

## 2023-08-06 LAB — TYPE AND SCREEN
ABO/RH(D): A POS
Antibody Screen: NEGATIVE

## 2023-08-06 LAB — ABO/RH: ABO/RH(D): A POS

## 2023-08-06 LAB — MAGNESIUM
Magnesium: 1.8 mg/dL (ref 1.7–2.4)
Magnesium: 2.6 mg/dL — ABNORMAL HIGH (ref 1.7–2.4)

## 2023-08-06 LAB — LACTIC ACID, PLASMA
Lactic Acid, Venous: 0.9 mmol/L (ref 0.5–1.9)
Lactic Acid, Venous: 1 mmol/L (ref 0.5–1.9)

## 2023-08-06 MED ORDER — BISACODYL 10 MG RE SUPP: 10.0000 mg | Freq: Every day | RECTAL | Status: DC | PRN

## 2023-08-06 MED ORDER — ACETAMINOPHEN 325 MG PO TABS: 650.0000 mg | ORAL_TABLET | Freq: Four times a day (QID) | ORAL | Status: DC | PRN

## 2023-08-06 MED ORDER — PANTOPRAZOLE SODIUM 40 MG IV SOLR
40.0000 mg | INTRAVENOUS | Status: DC
  Administered 2023-08-06 – 2023-08-14 (×9): 40 mg via INTRAVENOUS
  Filled 2023-08-06 (×9): qty 10

## 2023-08-06 MED ORDER — MORPHINE SULFATE (PF) 4 MG/ML IV SOLN
4.0000 mg | Freq: Once | INTRAVENOUS | Status: AC
  Administered 2023-08-06: 4 mg via INTRAVENOUS
  Filled 2023-08-06: qty 1

## 2023-08-06 MED ORDER — METOPROLOL TARTRATE 5 MG/5ML IV SOLN
5.0000 mg | Freq: Once | INTRAVENOUS | Status: AC
Start: 2023-08-06 — End: 2023-08-06
  Administered 2023-08-06: 5 mg via INTRAVENOUS
  Filled 2023-08-06: qty 5

## 2023-08-06 MED ORDER — ENOXAPARIN SODIUM 40 MG/0.4ML IJ SOSY
40.0000 mg | PREFILLED_SYRINGE | INTRAMUSCULAR | Status: DC
  Administered 2023-08-07 – 2023-08-10 (×3): 40 mg via SUBCUTANEOUS
  Filled 2023-08-06 (×3): qty 0.4

## 2023-08-06 MED ORDER — ONDANSETRON HCL 4 MG PO TABS: 4.0000 mg | ORAL_TABLET | Freq: Four times a day (QID) | ORAL | Status: DC | PRN

## 2023-08-06 MED ORDER — DIATRIZOATE MEGLUMINE & SODIUM 66-10 % PO SOLN
90.0000 mL | Freq: Once | ORAL | Status: AC
  Administered 2023-08-06: 90 mL via NASOGASTRIC
  Filled 2023-08-06: qty 90

## 2023-08-06 MED ORDER — PHENOL 1.4 % MT LIQD
1.0000 | OROMUCOSAL | Status: DC | PRN
  Administered 2023-08-06: 1 via OROMUCOSAL
  Filled 2023-08-06: qty 177

## 2023-08-06 MED ORDER — MORPHINE SULFATE (PF) 2 MG/ML IV SOLN
2.0000 mg | INTRAVENOUS | Status: DC | PRN
Start: 2023-08-06 — End: 2023-08-09
  Administered 2023-08-06: 4 mg via INTRAVENOUS
  Administered 2023-08-07 (×3): 2 mg via INTRAVENOUS
  Administered 2023-08-07: 4 mg via INTRAVENOUS
  Administered 2023-08-07 – 2023-08-09 (×5): 2 mg via INTRAVENOUS
  Filled 2023-08-06 (×5): qty 1
  Filled 2023-08-06: qty 2
  Filled 2023-08-06 (×3): qty 1
  Filled 2023-08-06: qty 2

## 2023-08-06 MED ORDER — IOHEXOL 300 MG/ML  SOLN
100.0000 mL | Freq: Once | INTRAMUSCULAR | Status: AC | PRN
  Administered 2023-08-06: 80 mL via INTRAVENOUS

## 2023-08-06 MED ORDER — ONDANSETRON HCL 4 MG/2ML IJ SOLN
4.0000 mg | Freq: Four times a day (QID) | INTRAMUSCULAR | Status: DC | PRN
  Administered 2023-08-07 (×3): 4 mg via INTRAVENOUS
  Filled 2023-08-06 (×3): qty 2

## 2023-08-06 MED ORDER — ACETAMINOPHEN 650 MG RE SUPP: 650.0000 mg | Freq: Four times a day (QID) | RECTAL | Status: DC | PRN

## 2023-08-06 MED ORDER — SODIUM CHLORIDE 0.9 % IV BOLUS
1000.0000 mL | Freq: Once | INTRAVENOUS | Status: AC
  Administered 2023-08-06: 1000 mL via INTRAVENOUS

## 2023-08-06 MED ORDER — MAGNESIUM SULFATE 2 GM/50ML IV SOLN
2.0000 g | Freq: Once | INTRAVENOUS | Status: AC
  Administered 2023-08-06: 2 g via INTRAVENOUS
  Filled 2023-08-06: qty 50

## 2023-08-06 MED ORDER — SODIUM CHLORIDE 0.9 % IV SOLN: INTRAVENOUS | Status: AC

## 2023-08-06 MED ORDER — ONDANSETRON HCL 4 MG/2ML IJ SOLN
4.0000 mg | Freq: Once | INTRAMUSCULAR | Status: AC
  Administered 2023-08-06: 4 mg via INTRAVENOUS
  Filled 2023-08-06: qty 2

## 2023-08-06 NOTE — ED Notes (Signed)
 Thomas with cl called for transport

## 2023-08-06 NOTE — ED Provider Notes (Signed)
 [facility] EMERGENCY DEPARTMENT AT [facility] Provider Note   CSN: [id number] Arrival date & time: 08/06/23  1004     History  Chief Complaint  Patient presents with   Abdominal Pain   Emesis    Brooke Sanders is a 78 y.o. female.  78 yo F with a chief complaint of diffuse abdominal pain nausea vomiting and diarrhea.  Has been going on for a few days now.  She ate at [employer] right before this.  She is wondering if maybe it something she ate.  Denies bloody or dark stool.  The diarrhea seems to have improved but still having intractable nausea and vomiting went to urgent care and then was sent here for evaluation.  No reported fevers.  History of a cholecystectomy.   Abdominal Pain Associated symptoms: vomiting   Emesis Associated symptoms: abdominal pain        Home Medications Prior to Admission medications   Medication Sig Start Date End Date Taking? Authorizing Provider  albuterol  (VENTOLIN  HFA) 108 (90 Base) MCG/ACT inhaler Inhale 2 puffs into the lungs every 6 (six) hours as needed for wheezing or shortness of breath. Patient not taking: Reported on 05/23/2023 02/27/23   Jordan, Betty G, MD  APPLE CIDER VINEGAR PO Take by mouth daily.    [provider]  aspirin 81 MG EC tablet Take 1 tablet by mouth daily.    [provider]  b complex vitamins tablet Take by mouth.    [provider]  Calcium -Magnesium -Vitamin D  (CITRACAL SLOW RELEASE PO) Take by mouth 2 (two) times daily.    [provider]  fluticasone (FLONASE) 50 MCG/ACT nasal spray Place into the nose as needed.    [provider]  hydrochlorothiazide  (HYDRODIURIL ) 25 MG tablet TAKE 1 TABLET(25 MG) BY MOUTH DAILY 07/11/23   Jordan, Betty G, MD  levothyroxine  (SYNTHROID ) 88 MCG tablet TAKE 1 TABLET BY MOUTH DAILY AT 6 AM 01/12/23   Jordan, Betty G, MD  metoprolol  succinate (TOPROL -XL) 25 MG 24 hr tablet TAKE 1 TABLET(25 MG) BY MOUTH DAILY 04/12/23    Lonni Slain, MD  montelukast  (SINGULAIR ) 10 MG tablet TAKE 1 TABLET(10 MG) BY MOUTH AT BEDTIME 01/12/23   Jordan, Betty G, MD  OVER THE COUNTER MEDICATION Nutrafol    [provider]  Polyethyl Glycol-Propyl Glycol (SYSTANE OP) Apply to eye.    [provider]  pravastatin  (PRAVACHOL ) 10 MG tablet Take 1 tablet (10 mg total) by mouth daily. 05/15/23   Lonni Slain, MD  pregabalin  (LYRICA ) 50 MG capsule TAKE 1 CAPSULE(50 MG) BY MOUTH TWICE DAILY 07/16/23   Jordan, Betty G, MD  Propylene Glycol 0.6 % SOLN     [provider]  RESTASIS 0.05 % ophthalmic emulsion 1 drop 2 (two) times daily. Patient not taking: Reported on 05/23/2023 10/24/21   [provider]  triamcinolone cream (KENALOG) 0.1 % Apply topically as needed. Patient not taking: Reported on 05/29/2023 11/21/17   [provider]  Zoledronic  Acid (RECLAST  IV) Inject into the vein. Last infusion: 10/20/2022. Patient not taking: Reported on 05/29/2023    [provider]      Allergies    Prednisone    Review of Systems   Review of Systems  Gastrointestinal:  Positive for abdominal pain and vomiting.    Physical Exam Updated Vital Signs BP (!) 172/61   Pulse 68   Temp 97.7 F (36.5 C)   Resp 15   SpO2 100%  Physical Exam Vitals  and nursing note reviewed.  Constitutional:      General: She is not in acute distress.    Appearance: She is well-developed. She is not diaphoretic.  HENT:     Head: Normocephalic and atraumatic.  Eyes:     Pupils: Pupils are equal, round, and reactive to light.  Cardiovascular:     Rate and Rhythm: Normal rate and regular rhythm.     Heart sounds: No murmur heard.    No friction rub. No gallop.  Pulmonary:     Effort: Pulmonary effort is normal.     Breath sounds: No wheezing or rales.  Abdominal:     General: There is no distension.     Palpations: Abdomen is soft.     Tenderness: There is abdominal tenderness.      Comments: Diffuse abdominal discomfort without obvious focality.  Musculoskeletal:        General: No tenderness.     Cervical back: Normal range of motion and neck supple.  Skin:    General: Skin is warm and dry.  Neurological:     Mental Status: She is alert and oriented to person, place, and time.  Psychiatric:        Behavior: Behavior normal.     ED Results / Procedures / Treatments   Labs (all labs ordered are listed, but only abnormal results are displayed) Labs Reviewed  CBC WITH DIFFERENTIAL/PLATELET - Abnormal; Notable for the following components:      Result Value   Lymphs Abs 0.5 (*)    Monocytes Absolute 1.2 (*)    All other components within normal limits  COMPREHENSIVE METABOLIC PANEL - Abnormal; Notable for the following components:   Glucose, Bld 136 (*)    BUN 26 (*)    All other components within normal limits  LIPASE, BLOOD  MAGNESIUM   URINALYSIS, ROUTINE W REFLEX MICROSCOPIC  LACTIC ACID, PLASMA    EKG None  Radiology CT ABDOMEN PELVIS W CONTRAST Result Date: 08/06/2023 CLINICAL DATA:  Diffuse abdominal distention. Abdominal pain. Nonlocalized. EXAM: CT ABDOMEN AND PELVIS WITH CONTRAST TECHNIQUE: Multidetector CT imaging of the abdomen and pelvis was performed using the standard protocol following bolus administration of intravenous contrast. RADIATION DOSE REDUCTION: This exam was performed according to the departmental dose-optimization program which includes automated exposure control, adjustment of the mA and/or kV according to patient size and/or use of iterative reconstruction technique. CONTRAST:  80mL OMNIPAQUE  IOHEXOL  300 MG/ML  SOLN COMPARISON:  None Available. FINDINGS: Lower chest: Bibasilar subsegmental atelectasis. Mild cardiomegaly, without pericardial or pleural effusion. Tiny hiatal hernia. Hepatobiliary: Normal liver. Cholecystectomy. Mild intrahepatic biliary duct dilatation. Moderate extrahepatic duct dilatation at 2.0 cm on coronal image  47. Followed to the level of the ampulla with gradual tapering. No calcified stone or obstructive mass identified. Pancreas: Mild pancreatic atrophy. No duct dilatation or acute inflammation. Spleen: Normal in size, without focal abnormality. Adrenals/Urinary Tract: Normal adrenal glands. Normal kidneys, without hydronephrosis. Normal urinary bladder. Stomach/Bowel: Normal remainder of the stomach. The colon is relatively decompressed especially distally Mid ileal loops are dilated and fluid-filled including up to 4.1 cm in 31/2. There are at least 2 small bowel transitions occurring adjacent to each other in the pelvis. The most well-defined is identified on coronal image 50 through 37. Just inferior and anterior to this, a second transition is identified on images 59 through 53 coronal. There may be a more caudal transition with small bowel feces sign on 47 through 43 of series 5 coronal. There is  a suspicion of mesenteric edema within the pelvis including on 65/2. No pneumatosis or free intraperitoneal air. Vascular/Lymphatic: Aortic atherosclerosis. Retroaortic left renal vein. No abdominopelvic adenopathy. Reproductive: Hysterectomy.  No adnexal mass. Other: Small volume abdominopelvic ascites. Musculoskeletal: Soft tissue density in the inferior right breast on 01/02 likely represents asymmetric parenchyma when correlated with 04/08/2023 chest CT (negative screening mammogram 09/28/2022). Osteopenia. L4-5 trans pedicle screw fixation. Trace L4-5 anterolisthesis. Lumbar spondylosis. IMPRESSION: 1. Small-bowel obstruction with multiple adjacent transitions within the pelvis, suspicious for closed loop obstruction and possible internal hernia. Recommend surgical consultation. 2. Small volume abdominopelvic ascites with suggestion of mesenteric edema adjacent to the presumed close loop obstruction. Cannot exclude complicating ischemia. 3. Cholecystectomy with biliary duct dilatation. Correlate with bilirubin  levels and if elevated consider nonemergent outpatient pre and post-contrast MRCP. 4.  Tiny hiatal hernia. 5.  Aortic Atherosclerosis (ICD10-I70.0). These results will be called to the ordering clinician or representative by the Radiologist Assistant, and communication documented in the PACS or [employer]. Electronically Signed   By: Rockey Kilts M.D.   On: 08/06/2023 12:48    Procedures Procedures    Medications Ordered in ED Medications  sodium chloride  0.9 % bolus 1,000 mL (1,000 mLs Intravenous New Bag/Given 08/06/23 1056)  morphine  (PF) 4 MG/ML injection 4 mg (4 mg Intravenous Given 08/06/23 1100)  ondansetron  (ZOFRAN ) injection 4 mg (4 mg Intravenous Given 08/06/23 1059)  iohexol  (OMNIPAQUE ) 300 MG/ML solution 100 mL (80 mLs Intravenous Contrast Given 08/06/23 1136)    ED Course/ Medical Decision Making/ A&P                                 Medical Decision Making Amount and/or Complexity of Data Reviewed Labs: ordered. Radiology: ordered.  Risk Prescription drug management.   78 yo F with a chief complaints of diffuse abdominal pain nausea vomiting and diarrhea.  Patient initially with nausea vomiting and diarrhea but has not had any stool output or flatus for about 48 hours..  Will obtain a laboratory evaluation and CT imaging, reassess.  Treat pain and nausea.  Patient feeling much better on repeat assessment.  No persistent vomiting.  No leukocytosis no anemia no significant electrolyte abnormalities LFTs and lipase are unremarkable.  CT imaging is concerning for small bowel obstruction.  Radiologist with concern for closed-loop obstruction.  I discussed with Burnard, on-call for general surgery.  Recommends medical admission and general surgery to follow.  Felt okay to go to either [facility] or [facility] whenever beds were available.   The patients results and plan were reviewed and discussed.   Any x-rays performed were independently reviewed by myself.   Differential  diagnosis were considered with the presenting HPI.  Medications  sodium chloride  0.9 % bolus 1,000 mL (1,000 mLs Intravenous New Bag/Given 08/06/23 1056)  morphine  (PF) 4 MG/ML injection 4 mg (4 mg Intravenous Given 08/06/23 1100)  ondansetron  (ZOFRAN ) injection 4 mg (4 mg Intravenous Given 08/06/23 1059)  iohexol  (OMNIPAQUE ) 300 MG/ML solution 100 mL (80 mLs Intravenous Contrast Given 08/06/23 1136)    Vitals:   08/06/23 1014 08/06/23 1015 08/06/23 1105  BP:  (!) 181/61 (!) 172/61  Pulse: (!) 58 63 68  Resp: 16 16 15   Temp:  97.7 F (36.5 C)   SpO2: 95% 97% 100%    Final diagnoses:  SBO (small bowel obstruction) (HCC)    Admission/ observation were discussed with the admitting physician, patient and/or family  and they are comfortable with the plan.           Final Clinical Impression(s) / ED Diagnoses Final diagnoses:  SBO (small bowel obstruction) [facility])    Rx / DC Orders ED Discharge Orders     None         Emil Share, DO 08/06/23 1313

## 2023-08-06 NOTE — Progress Notes (Signed)
 Plan of Care Note for accepted transfer   Patient: Brooke Sanders MRN: [id number]   DOA: 08/06/2023  Facility requesting transfer: [facility]. Requesting Provider: Rolan Quale, DO. Reason for transfer: Small bowel obstruction. Facility course:  Per EDP:  Chief Complaint  Patient presents with   Abdominal Pain   Emesis     Keyasia A. Mathew is a 78 y.o. female.   78 yo F with a chief complaint of diffuse abdominal pain nausea vomiting and diarrhea.  Has been going on for a few days now.  She ate at [employer] right before this.  She is wondering if maybe it something she ate.  Denies bloody or dark stool.  The diarrhea seems to have improved but still having intractable nausea and vomiting went to urgent care and then was sent here for evaluation.  No reported fevers.  History of a cholecystectomy.  Plan of care: The patient is accepted for admission to Telemetry unit, at [facility].  Recommended to administer metoprolol  5 mg IVP and magnesium  sulfate 2 g IVPB.  We will notify [facility] of patient's arrival to the facility.  Author: Alm Dorn Castor, MD 08/06/2023  Check [email or web address] for on-call coverage.  Nursing staff, Please call [facility] Admits & Consults System-Wide number on Amion as soon as patient's arrival, so appropriate admitting provider can evaluate the pt.

## 2023-08-06 NOTE — ED Triage Notes (Signed)
 Pt c/o NV, poor PO since Friday. C/o diffuse abd pain/ distension, inability to tolerate medications. First thought it was food poisoning, now "think it's something else."

## 2023-08-06 NOTE — Hospital Course (Signed)
 Patient with PMH of sarcoidosis, lap chole, remote history of SBO, hypothyroidism, HTN, HLD, neuropathy, presents to the hospital with complaints of abdominal pain nausea and vomiting and diarrhea. She went to [employer] on Friday, 1/10.  3 hours after eating she started having severe abdominal pain. This was followed by nausea and vomiting as well as diarrhea. Pregnancy of the nausea and vomiting is hourly and diarrhea was also about the same. She denies having any blood in the stool. Since Saturday she does not have any bowel movement.  And is actually not passing any gas. She continues to have nausea and vomiting on Sunday.  She initially thought that this would be food poisoning and it would get better on its own but because it did not get better and she continues to have nausea as well as abdominal pain she decided to come to the hospital for further evaluation. She has been trying Pedialyte to keep herself well-hydrated. She has not been able to take any of her medications during this timeline. She denies any alcohol  abuse or any other substance abuse. She reports that she has similar history of SBO in the past in 2010's. She denies any severe constipation before that but does have some mild constipation at her baseline. Denies any recent change in her medications. CT scan showed evidence of small bowel obstruction, likely closed-loop with mesenteric edema as well as concern for ischemia. General surgery was consulted.  Patient had NG tube inserted at urgent care and she was brought here. Tells me that the abdominal pain as well as nausea and vomiting has improved significantly but she has dry mouth now. Denies any headache denies any chest pain denies any recent surgeries or procedures. No tingling or numbness anywhere.  No other focal deficit.  No burning urination.  Assessment and Plan: Small bowel obstruction. Concern for closed-loop small bowel obstruction. Mesenteric edema as well as  ischemia is also in the differential based on the CT scan per radiology. General surgery is consulted.  I have informed general surgery of patient's arrival to the hospital. Will follow-up on recommendation. Patient will remain n.p.o. for now. IV fluid with normal saline for now. IV Zofran , IV morphine  for pain control. IV Protonix . Will hold off on DVT prophylaxis today but resume tomorrow pending surgical plans. Check type and screen in case patient require surgical intervention. Reassuringly for someone with multiple episodes of nausea vomiting as well as diarrhea ongoing for last 3 days her electrolytes as well as urine creatinine has been Within the normal range. Recheck lactic acid, electrolytes and serum creatinine.  HTN. On HCTZ as well as metoprolol  at home. Currently holding both of this medication.  HLD. On pravastatin  at home. Currently holding.  History of CAD. Remote history actually diagnosed on a CTA rather than patient actually having any angina symptoms. Does not appear to have undergone any intervention or stress test. Currently does not have any chest pain as well. On aspirin at home. Holding for now.  Sarcoidosis. Currently appears to be stable as well. No respiratory symptoms. Monitor.  Hypothyroidism. On Synthroid  88 mcg. For now we will hold.  If the patient is unable to take anything p.o. after 1/15, will consider utilizing this medication in IV form at half dose.

## 2023-08-06 NOTE — H&P (Signed)
 History and Physical  Patient: Brooke Sanders FMW:[id number] DOB: [birth year] DOA: 08/06/2023 DOS: the patient was seen and examined on 08/06/2023 Patient coming from: Home  Chief Complaint: Abdominal pain followed by nausea and vomiting as well as diarrhea ED TRIAGE note : Pt c/o NV, poor PO since Friday. C/o diffuse abd pain/ distension, inability to tolerate medications. First thought it was food poisoning, now think it's something else.   HPI: Patient with PMH of sarcoidosis, lap chole, remote history of SBO, hypothyroidism, HTN, HLD, neuropathy, presents to the hospital with complaints of abdominal pain nausea and vomiting and diarrhea. She went to [employer] on Friday, 1/10.  3 hours after eating she started having severe abdominal pain. This was followed by nausea and vomiting as well as diarrhea. Pregnancy of the nausea and vomiting is hourly and diarrhea was also about the same. She denies having any blood in the stool. Since Saturday she does not have any bowel movement.  And is actually not passing any gas. She continues to have nausea and vomiting on Sunday.  She initially thought that this would be food poisoning and it would get better on its own but because it did not get better and she continues to have nausea as well as abdominal pain she decided to come to the hospital for further evaluation. She has been trying Pedialyte to keep herself well-hydrated. She has not been able to take any of her medications during this timeline. She denies any alcohol  abuse or any other substance abuse. She reports that she has similar history of SBO in the past in 2010's. She denies any severe constipation before that but does have some mild constipation at her baseline. Denies any recent change in her medications. CT scan showed evidence of small bowel obstruction, likely closed-loop with mesenteric edema as well as concern for ischemia. General surgery was consulted.  Patient had NG tube  inserted at urgent care and she was brought here. Tells me that the abdominal pain as well as nausea and vomiting has improved significantly but she has dry mouth now. Denies any headache denies any chest pain denies any recent surgeries or procedures. No tingling or numbness anywhere.  No other focal deficit.  No burning urination.  Assessment and Plan: Small bowel obstruction. Concern for closed-loop small bowel obstruction. Mesenteric edema as well as ischemia is also in the differential based on the CT scan per radiology. General surgery is consulted.  I have informed general surgery of patient's arrival to the hospital. Will follow-up on recommendation. Patient will remain n.p.o. for now. IV fluid with normal saline for now. IV Zofran , IV morphine  for pain control. IV Protonix . Will hold off on DVT prophylaxis today but resume tomorrow pending surgical plans. Check type and screen in case patient require surgical intervention. Reassuringly for someone with multiple episodes of nausea vomiting as well as diarrhea ongoing for last 3 days her electrolytes as well as urine creatinine has been Within the normal range. Recheck lactic acid, electrolytes and serum creatinine.  HTN. On HCTZ as well as metoprolol  at home. Currently holding both of this medication.  HLD. On pravastatin  at home. Currently holding.  History of CAD. Remote history actually diagnosed on a CTA rather than patient actually having any angina symptoms. Does not appear to have undergone any intervention or stress test. Currently does not have any chest pain as well. On aspirin at home. Holding for now.  Sarcoidosis. Currently appears to be stable as well. No respiratory symptoms. Monitor.  Hypothyroidism. On Synthroid  88 mcg. For now we will hold.  If the patient is unable to take anything p.o. after 1/15, will consider utilizing this medication in IV form at half dose.    Advance Care Planning:   Code  Status: Full Code discussed in detail.  Patient does have a living will but does not have a copy with her here today. Patient is not sure with regards to her decision about CODE STATUS and would like to discuss with her son. I have informed her that we need to have some sort of decision until that time And she currently agrees for a full CODE STATUS for now.  Consults: General surgery.  Discussed with Dr. Rubin.  Prior to Admission medications   Medication Sig Start Date End Date Taking? Authorizing Provider  APPLE CIDER VINEGAR PO Take by mouth daily.    [provider]  aspirin 81 MG EC tablet Take 1 tablet by mouth daily.    [provider]  b complex vitamins tablet Take by mouth.    [provider]  Calcium -Magnesium -Vitamin D  (CITRACAL SLOW RELEASE PO) Take by mouth 2 (two) times daily.    [provider]  fluticasone (FLONASE) 50 MCG/ACT nasal spray Place into the nose as needed.    [provider]  hydrochlorothiazide  (HYDRODIURIL ) 25 MG tablet TAKE 1 TABLET(25 MG) BY MOUTH DAILY 07/11/23   Jordan, Betty G, MD  levothyroxine  (SYNTHROID ) 88 MCG tablet TAKE 1 TABLET BY MOUTH DAILY AT 6 AM 01/12/23   Jordan, Betty G, MD  metoprolol  succinate (TOPROL -XL) 25 MG 24 hr tablet TAKE 1 TABLET(25 MG) BY MOUTH DAILY 04/12/23   Lonni Slain, MD  montelukast  (SINGULAIR ) 10 MG tablet TAKE 1 TABLET(10 MG) BY MOUTH AT BEDTIME 01/12/23   Jordan, Betty G, MD  OVER THE COUNTER MEDICATION Nutrafol    [provider]  Polyethyl Glycol-Propyl Glycol (SYSTANE OP) Apply to eye.    [provider]  pravastatin  (PRAVACHOL ) 10 MG tablet Take 1 tablet (10 mg total) by mouth daily. 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   Coronary artery disease 2005   Deviated septum 03/2011   Fibromyalgia 2005   Graves disease 2001   Hepatitis C    History of coronary angiogram 05/2012   Hx of LASIK 2000   Hypertension    Optic disc hemorrhage 2005   Osteopenia 2005   Osteoporosis    Pneumonia 1994   Proptosis 2011   Restless leg syndrome 2005   Sarcoidosis 1980   Spondylolisthesis    Strabismus 2013   Stroke [facility])    Past Surgical History:  Procedure Laterality Date   ABDOMINAL HYSTERECTOMY  2000   CARPAL TUNNEL RELEASE Right 2009   CHOLECYSTECTOMY  1975   COLON SURGERY  2021   COLONOSCOPY     x3 in 6 months. 2020-2021   cystic ectopic ovary  2000   EYE SURGERY     LAMINECTOMY  1986   LUMBAR DISC SURGERY  1986   orbital decompression Bilateral 03/2011  ossified ganglion surgery Right 1974   SECONDARY INTRAOCULAR LENSE IMPLANTATION Right 05/2012   SKIN BIOPSY  05/22/2023   right leg   SPINE SURGERY     TOTAL THYROIDECTOMY  2011   TUMOR REMOVAL Left 10/06/2021   left leg   Social History:  reports that she has never smoked. She has been exposed to tobacco smoke. She has never used smokeless tobacco. She reports current alcohol  use of about 2.0 standard drinks of alcohol  per week. She reports that she does not use drugs. Allergies  Allergen Reactions   Prednisone     Other reaction(s): Other (See Comments) Pain, bloating   Family History  Problem Relation Age of Onset   Vision loss Mother    Varicose Veins Mother    Heart disease Father    Stroke Father    Vision loss Father    Vision loss Sister    Heart disease Brother    Vision loss Brother    Prostate cancer Nephew    Physical Exam: Vitals:   08/06/23 1600 08/06/23 1615 08/06/23 1645 08/06/23 1804  BP: (!) 168/69 (!) 151/70 (!) 156/76 (!) 148/65  Pulse: 77 65 69 69  Resp: (!) 22  (!) 21 17 18   Temp:    98.8 F (37.1 C)  TempSrc:    Oral  SpO2: 98% 93% 94% 98%   General: Appear in mild distress; no visible Abnormal Neck Mass Or lumps, Conjunctiva normal Cardiovascular: S1 and S2 Present, no Murmur, Respiratory: good respiratory effort, Bilateral Air entry present and CTA, no Crackles, no wheezes Abdomen: Bowel Sound absent, mildly distended, tight, diffusely tender Extremities: no Pedal edema Neurology: alert and oriented to time, place, and person, chronic strabismus Gait not checked due to patient safety concerns   Data Reviewed: I have reviewed ED notes, Vitals, Lab results and outpatient records. Since last encounter, pertinent lab results CBC and CMP   . I have ordered test including CBC and CMP  . I have discussed pt's care plan and test results with general surgery  .   Family Communication: No one at bedside  Author: Yetta Blanch, MD 08/06/2023 6:21 PM For on call review [email or web address].

## 2023-08-06 NOTE — Consult Note (Signed)
 Consult Note  Lera A. Frenette [birth year]  [id number].    Requesting MD: Rolan Quale, DO Chief Complaint/Reason for Consult: ?SBO HPI:  Patient is a 78 year old female who presented to the ED with abdominal pain, nausea, vomiting and diarrhea. Started a few days ago after eating at [employer]. Diarrhea now resolved but nausea and vomiting have not. She denies fever, chills. Abdominal pain is diffuse. Prior abdominal surgery includes cholecystectomy, abdominal hysterectomy, something for cystic ectopic ovary in 2000. Not on blood thinners, allergy to prednisone. PMH otherwise significant for Hx of CVA, RLS, Fibromyalgia, HTN, Hx of Hep C, Graves disease and CAD.   Patient states that she feels better after having her NG tube placed and suction at the outside ER.  She states that her abdominal bloating is somewhat better.  CT scan revealed signs consistent with SBO, possible enteritis.  Patient with no leukocytosis.  I did review the patient's CT scan laboratory studies personally  ROS: Review of Systems  Constitutional:  Negative for chills, fever and malaise/fatigue.  HENT:  Negative for ear discharge, hearing loss and sore throat.   Eyes:  Negative for blurred vision and discharge.  Respiratory:  Negative for cough and shortness of breath.   Cardiovascular:  Negative for chest pain, orthopnea and leg swelling.  Gastrointestinal:  Positive for abdominal pain, diarrhea, nausea and vomiting. Negative for constipation and heartburn.  Musculoskeletal:  Negative for myalgias and neck pain.  Skin:  Negative for itching and rash.  Neurological:  Negative for dizziness, focal weakness, seizures and loss of consciousness.  Endo/Heme/Allergies:  Negative for environmental allergies. Does not bruise/bleed easily.  Psychiatric/Behavioral:  Negative for depression and suicidal ideas.   All other systems reviewed and are negative.   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   Coronary artery disease 2005   Deviated septum 03/2011   Fibromyalgia 2005   Graves disease 2001   Hepatitis C    History of coronary angiogram 05/2012   Hx of LASIK 2000   Hypertension    Optic disc hemorrhage 2005   Osteopenia 2005   Osteoporosis    Pneumonia 1994   Proptosis 2011   Restless leg syndrome 2005   Sarcoidosis 1980   Spondylolisthesis    Strabismus 2013   Stroke [facility])     Past Surgical History:  Procedure Laterality Date   ABDOMINAL HYSTERECTOMY  2000   CARPAL TUNNEL RELEASE Right 2009   CHOLECYSTECTOMY  1975   COLON SURGERY  2021   COLONOSCOPY     x3 in 6 months. 2020-2021   cystic ectopic ovary  2000   EYE SURGERY     LAMINECTOMY  1986   LUMBAR DISC SURGERY  1986   orbital decompression Bilateral 03/2011   ossified ganglion surgery Right 1974   SECONDARY INTRAOCULAR LENSE IMPLANTATION Right 05/2012  SKIN BIOPSY  05/22/2023   right leg   SPINE SURGERY     TOTAL THYROIDECTOMY  2011   TUMOR REMOVAL Left 10/06/2021   left leg    Social History:  reports that she has never smoked. She has been exposed to tobacco smoke. She has never used smokeless tobacco. She reports current alcohol  use of about 2.0 standard drinks of alcohol  per week. She reports that she does not use drugs.  Allergies:  Allergies  Allergen Reactions   Prednisone     Other reaction(s): Other (See Comments) Pain, bloating    (Not in a hospital admission)   Blood pressure (!) 164/66, pulse 69, temperature 97.7  F (36.5 C), resp. rate 14, SpO2 98%. Physical Exam:  General: pleasant, WD,  female who is laying in bed in NAD HEENT: head is normocephalic, atraumatic.  Sclera are noninjected.  PERRL.  Ears and nose without any masses or lesions.  Mouth is pink and moist Heart: regular, rate, and rhythm.  Normal s1,s2. No obvious murmurs, gallops, or rubs noted.  Palpable radial and pedal pulses bilaterally Lungs: CTAB, no wheezes, rhonchi, or rales noted.  Respiratory effort nonlabored Abd: soft, minimal tenderness, distended, +BS, no masses, hernias, or organomegaly, large midline incision MS: all 4 extremities are symmetrical with no cyanosis, clubbing, or edema. Skin: warm and dry with no masses, lesions, or rashes Neuro: Cranial nerves 2-12 grossly intact, sensation is normal throughout Psych: A&Ox3 with an appropriate affect.   Results for orders placed or performed during the hospital encounter of 08/06/23 (from the past 48 hours)  CBC with Differential     Status: Abnormal   Collection Time: 08/06/23 10:54 AM  Result Value Ref Range   WBC 5.4 4.0 - 10.5 K/uL   RBC 4.36 3.87 - 5.11 MIL/uL   Hemoglobin 13.6 12.0 - 15.0 g/dL   HCT 60.7 63.9 - 53.9 %   MCV 89.9 80.0 - 100.0 fL   MCH 31.2 26.0 - 34.0 pg   MCHC 34.7 30.0 - 36.0 g/dL   RDW 87.1 88.4 - 84.4 %   Platelets 189 150 - 400 K/uL   nRBC 0.0 0.0 - 0.2 %   Neutrophils Relative % 69 %   Neutro Abs 3.7 1.7 - 7.7 K/uL   Lymphocytes Relative 9 %   Lymphs Abs 0.5 (L) 0.7 - 4.0 K/uL   Monocytes Relative 22 %   Monocytes Absolute 1.2 (H) 0.1 - 1.0 K/uL   Eosinophils Relative 0 %   Eosinophils Absolute 0.0 0.0 - 0.5 K/uL   Basophils Relative 0 %   Basophils Absolute 0.0 0.0 - 0.1 K/uL   Immature Granulocytes 0 %   Abs Immature Granulocytes 0.01 0.00 - 0.07 K/uL    Comment: Performed at [facility], [address] [place], [place] [address]  Comprehensive metabolic panel     Status: Abnormal   Collection Time:  08/06/23 10:54 AM  Result Value Ref Range   Sodium 137 135 - 145 mmol/L   Potassium 4.1 3.5 - 5.1 mmol/L   Chloride 98 98 - 111 mmol/L   CO2 27 22 - 32 mmol/L   Glucose, Bld 136 (H) 70 - 99 mg/dL    Comment: Glucose reference range applies only to samples taken after fasting for at least 8 hours.   BUN 26 (H) 8 - 23 mg/dL   Creatinine, Ser 9.28 0.44 - 1.00 mg/dL   Calcium  9.3 8.9 - 10.3 mg/dL   Total Protein 6.9 6.5 -  8.1 g/dL   Albumin  4.2 3.5 - 5.0 g/dL   AST 15 15 - 41 U/L   ALT 12 0 - 44 U/L   Alkaline Phosphatase 38 38 - 126 U/L   Total Bilirubin 1.1 0.0 - 1.2 mg/dL   GFR, Estimated >39 >39 mL/min    Comment: (NOTE) Calculated using the CKD-EPI Creatinine Equation (2021)    Anion gap 12 5 - 15    Comment: Performed at [facility], [address], [place], [place] [address]  Lipase, blood     Status: None   Collection Time: 08/06/23 10:54 AM  Result Value Ref Range   Lipase 15 11 - 51 U/L    Comment: Performed at [facility], [address], [place], [place] [address]  Magnesium      Status: None   Collection Time: 08/06/23 10:54 AM  Result Value Ref Range   Magnesium  1.8 1.7 - 2.4 mg/dL    Comment: Performed at [facility], [address] [place], [place] [address]  TSH     Status: None   Collection Time: 08/06/23 10:54 AM  Result Value Ref Range   TSH 0.426 0.350 - 4.500 uIU/mL    Comment: Performed by a 3rd Generation assay with a functional sensitivity of <=0.01 uIU/mL. Performed at [facility], [address] [place], [place] [address]   Lactic acid, plasma     Status: None   Collection Time: 08/06/23  1:22 PM  Result Value Ref Range   Lactic Acid, Venous 0.9 0.5 - 1.9 mmol/L    Comment: Performed at [facility], [address] [place], [place] [address]   CT ABDOMEN PELVIS W CONTRAST Result Date: 08/06/2023 CLINICAL DATA:  Diffuse abdominal distention.  Abdominal pain. Nonlocalized. EXAM: CT ABDOMEN AND PELVIS WITH CONTRAST TECHNIQUE: Multidetector CT imaging of the abdomen and pelvis was performed using the standard protocol following bolus administration of intravenous contrast. RADIATION DOSE REDUCTION: This exam was performed according to the departmental dose-optimization program which includes automated exposure control, adjustment of the mA and/or kV according to patient size and/or use of iterative reconstruction technique. CONTRAST:  80mL OMNIPAQUE  IOHEXOL  300 MG/ML  SOLN COMPARISON:  None Available. FINDINGS: Lower chest: Bibasilar subsegmental atelectasis. Mild cardiomegaly, without pericardial or pleural effusion. Tiny hiatal hernia. Hepatobiliary: Normal liver. Cholecystectomy. Mild intrahepatic biliary duct dilatation. Moderate extrahepatic duct dilatation at 2.0 cm on coronal image 47. Followed to the level of the ampulla with gradual tapering. No calcified stone or obstructive mass identified. Pancreas: Mild pancreatic atrophy. No duct dilatation or acute inflammation. Spleen: Normal in size, without focal abnormality. Adrenals/Urinary Tract: Normal adrenal glands. Normal kidneys, without hydronephrosis. Normal urinary bladder. Stomach/Bowel: Normal remainder of the stomach. The colon is relatively decompressed especially distally Mid ileal loops are dilated and fluid-filled including up to 4.1 cm in 31/2. There are at least 2 small bowel transitions occurring adjacent to each other in the pelvis. The most well-defined is identified on coronal image 50 through 37. Just inferior and anterior to this, a second transition is identified on images 59 through 53 coronal. There may be a more caudal transition with small bowel feces sign on 47 through 43 of series 5 coronal. There is a suspicion of mesenteric edema within the pelvis including on 65/2. No pneumatosis or free intraperitoneal air. Vascular/Lymphatic: Aortic atherosclerosis. Retroaortic left  renal vein. No abdominopelvic adenopathy. Reproductive: Hysterectomy.  No adnexal mass. Other: Small volume abdominopelvic ascites. Musculoskeletal: Soft tissue density in the inferior  right breast on 01/02 likely represents asymmetric parenchyma when correlated with 04/08/2023 chest CT (negative screening mammogram 09/28/2022). Osteopenia. L4-5 trans pedicle screw fixation. Trace L4-5 anterolisthesis. Lumbar spondylosis. IMPRESSION: 1. Small-bowel obstruction with multiple adjacent transitions within the pelvis, suspicious for closed loop obstruction and possible internal hernia. Recommend surgical consultation. 2. Small volume abdominopelvic ascites with suggestion of mesenteric edema adjacent to the presumed close loop obstruction. Cannot exclude complicating ischemia. 3. Cholecystectomy with biliary duct dilatation. Correlate with bilirubin levels and if elevated consider nonemergent outpatient pre and post-contrast MRCP. 4.  Tiny hiatal hernia. 5.  Aortic Atherosclerosis (ICD10-I70.0). These results will be called to the ordering clinician or representative by the Radiologist Assistant, and communication documented in the PACS or [employer]. Electronically Signed   By: Rockey Kilts M.D.   On: 08/06/2023 12:48      Assessment/Plan SBO vs enteritis with ileus  - CT with SBO with multiple transitions in the pelvis, ?internal hernia, small volume ascites, cholecystectomy, tiny hiatal hernia  - pt reports n/v/d after eating at [employer] - n/v now persistent  - no leukocytosis, lactic WNL at 0.9, HD stable - not consistent with closed loop obstruction  - hx of multiple prior abdominal surgeries - recommend NGT for decompression and SBO protocol   FEN: NPO, IVF per TRH, NGT to LIWS once placed VTE: ok to have SQH or LMWH from surgery standpoint  ID: no current abx  - per TRH -  Hx of CVA RLS Fibromyalgia HTN Hx of Hep C Graves disease CAD  I reviewed last 24 h vitals and pain scores, last  48 h intake and output, last 24 h labs and trends, and last 24 h imaging results.  This care required moderate level of medical decision making.   Burnard JONELLE Louder, PA-C [facility] 08/06/2023, 3:00 PM Please see Amion for pager number during day hours 7:00am-4:30pm

## 2023-08-07 ENCOUNTER — Inpatient Hospital Stay (HOSPITAL_COMMUNITY): Payer: Medicare Other

## 2023-08-07 DIAGNOSIS — K56609 Unspecified intestinal obstruction, unspecified as to partial versus complete obstruction: Secondary | ICD-10-CM | POA: Diagnosis not present

## 2023-08-07 LAB — CBC
HCT: 39.9 % (ref 36.0–46.0)
Hemoglobin: 13.2 g/dL (ref 12.0–15.0)
MCH: 31.1 pg (ref 26.0–34.0)
MCHC: 33.1 g/dL (ref 30.0–36.0)
MCV: 93.9 fL (ref 80.0–100.0)
Platelets: 190 10*3/uL (ref 150–400)
RBC: 4.25 MIL/uL (ref 3.87–5.11)
RDW: 12.7 % (ref 11.5–15.5)
WBC: 4.9 10*3/uL (ref 4.0–10.5)
nRBC: 0 % (ref 0.0–0.2)

## 2023-08-07 LAB — URINALYSIS, ROUTINE W REFLEX MICROSCOPIC
Bilirubin Urine: NEGATIVE
Glucose, UA: NEGATIVE mg/dL
Hgb urine dipstick: NEGATIVE
Ketones, ur: 5 mg/dL — AB
Leukocytes,Ua: NEGATIVE
Nitrite: NEGATIVE
Protein, ur: 100 mg/dL — AB
Specific Gravity, Urine: >1.046 — ABNORMAL HIGH (ref 1.005–1.030)
pH: 5 (ref 5.0–8.0)

## 2023-08-07 LAB — BASIC METABOLIC PANEL
Anion gap: 7 (ref 5–15)
BUN: 24 mg/dL — ABNORMAL HIGH (ref 8–23)
CO2: 26 mmol/L (ref 22–32)
Calcium: 8 mg/dL — ABNORMAL LOW (ref 8.9–10.3)
Chloride: 104 mmol/L (ref 98–111)
Creatinine, Ser: 0.66 mg/dL (ref 0.44–1.00)
GFR, Estimated: >60 mL/min (ref >60)
Glucose, Bld: 111 mg/dL — ABNORMAL HIGH (ref 70–99)
Potassium: 3.9 mmol/L (ref 3.5–5.1)
Sodium: 137 mmol/L (ref 135–145)

## 2023-08-07 MED ORDER — HYDRALAZINE HCL 20 MG/ML IJ SOLN
10.0000 mg | INTRAMUSCULAR | Status: AC | PRN
Start: 2023-08-07 — End: ?
  Administered 2023-08-07 – 2023-08-13 (×4): 10 mg via INTRAVENOUS
  Filled 2023-08-07 (×4): qty 1

## 2023-08-07 MED ORDER — PROCHLORPERAZINE EDISYLATE 10 MG/2ML IJ SOLN
10.0000 mg | Freq: Four times a day (QID) | INTRAMUSCULAR | Status: DC | PRN
  Administered 2023-08-07: 10 mg via INTRAVENOUS
  Filled 2023-08-07: qty 2

## 2023-08-07 MED ORDER — SODIUM CHLORIDE 0.9 % IV SOLN: INTRAVENOUS | Status: AC

## 2023-08-07 NOTE — Plan of Care (Signed)
  Problem: Education: Goal: Knowledge of General Education information will improve Description: Including pain rating scale, medication(s)/side effects and non-pharmacologic comfort measures Outcome: Progressing   Problem: Health Behavior/Discharge Planning: Goal: Ability to manage health-related needs will improve Outcome: Progressing   Problem: Clinical Measurements: Goal: Ability to maintain clinical measurements within normal limits will improve Outcome: Progressing Goal: Will remain free from infection Outcome: Progressing Goal: Diagnostic test results will improve Outcome: Progressing Goal: Respiratory complications will improve Outcome: Progressing   Problem: Activity: Goal: Risk for activity intolerance will decrease Outcome: Progressing   Problem: Coping: Goal: Level of anxiety will decrease Outcome: Progressing   Problem: Elimination: Goal: Will not experience complications related to bowel motility Outcome: Progressing Goal: Will not experience complications related to urinary retention Outcome: Progressing   Problem: Pain Management: Goal: General experience of comfort will improve Outcome: Progressing   Problem: Safety: Goal: Ability to remain free from injury will improve Outcome: Progressing

## 2023-08-07 NOTE — Progress Notes (Signed)
 Subjective/Chief Complaint: Still with some ab pain, better than before, no flatus   Objective: Vital signs in last 24 hours: Temp:  [97.7 F (36.5 C)-98.8 F (37.1 C)] 98.6 F (37 C) (01/14 0341) Pulse Rate:  [58-80] 77 (01/14 0341) Resp:  [14-22] 18 (01/14 0341) BP: (147-181)/(61-76) 168/72 (01/14 0341) SpO2:  [93 %-100 %] 97 % (01/14 0341) Last BM Date : 08/04/23  Intake/Output from previous day: 01/13 0701 - 01/14 0700 In: 785.1 [I.V.:741.1; IV Piggyback:44] Out: 400 [Urine:300; Emesis/NG output:100] Intake/Output this shift: Total I/O In: -  Out: 200 [Urine:200]  Ab soft mild tenderness diffusely with mild distention, well healed scars no peritonitis  Lab Results:  Recent Labs    08/06/23 1847 08/07/23 0517  WBC 5.3 4.9  HGB 12.9 13.2  HCT 39.4 39.9  PLT 182 190   BMET Recent Labs    08/06/23 1847 08/07/23 0517  NA 137 137  K 4.0 3.9  CL 103 104  CO2 24 26  GLUCOSE 118* 111*  BUN 23 24*  CREATININE 0.63 0.66  CALCIUM  8.3* 8.0*   PT/INR No results for input(s): LABPROT, INR in the last 72 hours. ABG No results for input(s): PHART, HCO3 in the last 72 hours.  Invalid input(s): PCO2, PO2  Studies/Results: DG Abd Portable 1V-Small Bowel Obstruction Protocol-initial, 8 hr delay Result Date: 08/07/2023 CLINICAL DATA:  Small bowel protocol, 8 hour post contrast filled EXAM: PORTABLE ABDOMEN - 1 VIEW COMPARISON:  08/06/2023 FINDINGS: NG tube tip is near the GE junction. Dilated small bowel loops are again noted measuring up to 4.6 cm in diameter. Contrast material seen within dilated small bowel loops. No contrast within the colon. IMPRESSION: Continued small bowel obstruction pattern. No contrast yet within the colon. Electronically Signed   By: Franky Crease M.D.   On: 08/07/2023 09:23   DG Abd Portable 1 View Result Date: 08/06/2023 CLINICAL DATA:  verify ng placement EXAM: PORTABLE ABDOMEN - 1 VIEW COMPARISON:  CT abdomen pelvis  08/06/2023 FINDINGS: Enteric tube coursing below the diaphragm with tip overlying the expected region of the gastric lumen and side port overlying the expected region of the gastroesophageal junction. The heart and mediastinal contours are within normal limits. Of sclerotic plaque. No focal consolidation. No pulmonary edema. No pleural effusion. No pneumothorax. Multiple loops of small bowel dilated with gas within the upper abdomen. No acute osseous abnormality. IMPRESSION: 1. Enteric tube coursing below the diaphragm with tip overlying the expected region of the gastric lumen and side port overlying the expected region of the gastroesophageal junction. Recommend advancement by 2 cm. 2. Small-bowel obstruction. Electronically Signed   By: Morgane  Naveau M.D.   On: 08/06/2023 18:06   CT ABDOMEN PELVIS W CONTRAST Result Date: 08/06/2023 CLINICAL DATA:  Diffuse abdominal distention. Abdominal pain. Nonlocalized. EXAM: CT ABDOMEN AND PELVIS WITH CONTRAST TECHNIQUE: Multidetector CT imaging of the abdomen and pelvis was performed using the standard protocol following bolus administration of intravenous contrast. RADIATION DOSE REDUCTION: This exam was performed according to the departmental dose-optimization program which includes automated exposure control, adjustment of the mA and/or kV according to patient size and/or use of iterative reconstruction technique. CONTRAST:  80mL OMNIPAQUE  IOHEXOL  300 MG/ML  SOLN COMPARISON:  None Available. FINDINGS: Lower chest: Bibasilar subsegmental atelectasis. Mild cardiomegaly, without pericardial or pleural effusion. Tiny hiatal hernia. Hepatobiliary: Normal liver. Cholecystectomy. Mild intrahepatic biliary duct dilatation. Moderate extrahepatic duct dilatation at 2.0 cm on coronal image 47. Followed to the level of the ampulla with  gradual tapering. No calcified stone or obstructive mass identified. Pancreas: Mild pancreatic atrophy. No duct dilatation or acute inflammation.  Spleen: Normal in size, without focal abnormality. Adrenals/Urinary Tract: Normal adrenal glands. Normal kidneys, without hydronephrosis. Normal urinary bladder. Stomach/Bowel: Normal remainder of the stomach. The colon is relatively decompressed especially distally Mid ileal loops are dilated and fluid-filled including up to 4.1 cm in 31/2. There are at least 2 small bowel transitions occurring adjacent to each other in the pelvis. The most well-defined is identified on coronal image 50 through 37. Just inferior and anterior to this, a second transition is identified on images 59 through 53 coronal. There may be a more caudal transition with small bowel feces sign on 47 through 43 of series 5 coronal. There is a suspicion of mesenteric edema within the pelvis including on 65/2. No pneumatosis or free intraperitoneal air. Vascular/Lymphatic: Aortic atherosclerosis. Retroaortic left renal vein. No abdominopelvic adenopathy. Reproductive: Hysterectomy.  No adnexal mass. Other: Small volume abdominopelvic ascites. Musculoskeletal: Soft tissue density in the inferior right breast on 01/02 likely represents asymmetric parenchyma when correlated with 04/08/2023 chest CT (negative screening mammogram 09/28/2022). Osteopenia. L4-5 trans pedicle screw fixation. Trace L4-5 anterolisthesis. Lumbar spondylosis. IMPRESSION: 1. Small-bowel obstruction with multiple adjacent transitions within the pelvis, suspicious for closed loop obstruction and possible internal hernia. Recommend surgical consultation. 2. Small volume abdominopelvic ascites with suggestion of mesenteric edema adjacent to the presumed close loop obstruction. Cannot exclude complicating ischemia. 3. Cholecystectomy with biliary duct dilatation. Correlate with bilirubin levels and if elevated consider nonemergent outpatient pre and post-contrast MRCP. 4.  Tiny hiatal hernia. 5.  Aortic Atherosclerosis (ICD10-I70.0). These results will be called to the ordering  clinician or representative by the Radiologist Assistant, and communication documented in the PACS or [employer]. Electronically Signed   By: Rockey Kilts M.D.   On: 08/06/2023 12:48    Anti-infectives: Anti-infectives (From admission, onward)    None       Assessment/Plan: SBO vs enteritis with ileus  - CT with SBO with multiple transitions in the pelvis, ?internal hernia, small volume ascites, cholecystectomy, tiny hiatal hernia  - pt reports n/v/d after eating at [employer] - n/v now persistent  - no leukocytosis, lactic WNL at 0.9, HD stable - not consistent with closed loop obstruction  - hx of multiple prior abdominal surgeries - recommend NGT for decompression and SBO protocol- I think reasonable to observe for another 24 hours unless worsens- if no better tomorrow clinically or on a film will plan for surgery or if worsens while here   FEN: NPO, IVF per [facility], NGT to LIWS  VTE: lovenox  ID: no current abx   - per [facility] -  Hx of CVA RLS Fibromyalgia HTN Hx of Hep C Graves disease  I reviewed hospitalist notes, last 24 h vitals and pain scores, last 48 h intake and output, last 24 h labs and trends, and last 24 h imaging results.    Donnice Bury 08/07/2023

## 2023-08-07 NOTE — Progress Notes (Signed)
 [facility] Progress Note Patient: Brooke Sanders FMW:[id number] DOB: 09-24-1945 DOA: 08/06/2023  DOS: the patient was seen and examined on 08/07/2023  Brief Hospital Course: Patient with PMH of sarcoidosis, lap chole, remote history of SBO, hypothyroidism, HTN, HLD, neuropathy, presents to the hospital with complaints of abdominal pain nausea and vomiting and diarrhea. She went to [employer] on Friday, 1/10.  3 hours after eating she started having severe abdominal pain. Found to have SBO. General surgery consulted. Currently feels that the patient is not suffering from closed-loop bowel obstruction and recommending conservative measures.  Assessment and Plan: Small bowel obstruction. Concern for closed-loop small bowel obstruction. Mesenteric edema as well as ischemia is also in the differential based on the CT scan per radiology. General surgery is consulted.  Currently has NG tube and being treated conservatively with small bowel protocol. Patient will remain n.p.o. for now. Continue with IV fluids. IV Zofran , IV morphine  for pain control. IV Protonix .  HTN. On HCTZ as well as metoprolol  at home. Currently holding both of this medication. As needed hydralazine .  HLD. On pravastatin  at home. Currently holding.  History of CAD. Remote history actually diagnosed on a CTA rather than patient actually having any angina symptoms. Does not appear to have undergone any intervention or stress test. Currently does not have any chest pain as well. On aspirin at home. Holding for now.  Sarcoidosis. Currently appears to be stable as well. No respiratory symptoms. Monitor.  Hypothyroidism. On Synthroid  88 mcg. For now we will hold.  If the patient is unable to take anything p.o. after 1/15, will consider utilizing this medication in IV form at half dose.    Subjective: No vomiting.  Has some nausea.  Not passing gas.  Physical Exam: General: in Mild distress, No  Rash Cardiovascular: S1 and S2 Present, No Murmur Respiratory: Good respiratory effort, Bilateral Air entry present. No Crackles, No wheezes Abdomen: Bowel Sound absent, diffuse tenderness, improvement in softness of abdomen Extremities: No edema Neuro: Alert and oriented x3, no new focal deficit  Data Reviewed: I have Reviewed nursing notes, Vitals, and Lab results. Since last encounter, pertinent lab results CBC and BMP   . I have ordered test including CBC and BMP  . I have discussed pt's care plan and test results with general surgery  .   Disposition: Status is: Inpatient Remains inpatient appropriate because: Monitor for improvement in SBO  enoxaparin  (LOVENOX ) injection 40 mg Start: 08/07/23 1000   Family Communication: Son at bedside Level of care: Telemetry continue for now Vitals:   08/06/23 2028 08/07/23 0341 08/07/23 1451 08/07/23 1752  BP: (!) 147/66 (!) 168/72 (!) 167/88 (!) 175/85  Pulse: 80 77 79 80  Resp: 18 18 20    Temp: 98.3 F (36.8 C) 98.6 F (37 C) 97.8 F (36.6 C)   TempSrc: Oral Oral Oral   SpO2: 98% 97% 99% 99%     Author: Yetta Blanch, MD 08/07/2023 8:04 PM  Please look on [email or web address] to find out who is on call.

## 2023-08-07 NOTE — Progress Notes (Signed)
   08/07/23 1332  TOC Brief Assessment  Insurance and Status Reviewed  Patient has primary care physician Yes  Home environment has been reviewed Apartment  Prior level of function: Independent  Prior/Current Home Services No current home services  Social Drivers of Health Review SDOH reviewed no interventions necessary  Readmission risk has been reviewed Yes  Transition of care needs no transition of care needs at this time

## 2023-08-08 ENCOUNTER — Inpatient Hospital Stay (HOSPITAL_COMMUNITY): Payer: Medicare Other

## 2023-08-08 DIAGNOSIS — K56609 Unspecified intestinal obstruction, unspecified as to partial versus complete obstruction: Secondary | ICD-10-CM | POA: Diagnosis not present

## 2023-08-08 LAB — CBC
HCT: 42.7 % (ref 36.0–46.0)
Hemoglobin: 13.8 g/dL (ref 12.0–15.0)
MCH: 30.8 pg (ref 26.0–34.0)
MCHC: 32.3 g/dL (ref 30.0–36.0)
MCV: 95.3 fL (ref 80.0–100.0)
Platelets: 219 10*3/uL (ref 150–400)
RBC: 4.48 MIL/uL (ref 3.87–5.11)
RDW: 12.8 % (ref 11.5–15.5)
WBC: 5 10*3/uL (ref 4.0–10.5)
nRBC: 0 % (ref 0.0–0.2)

## 2023-08-08 LAB — BASIC METABOLIC PANEL
Anion gap: 9 (ref 5–15)
BUN: 29 mg/dL — ABNORMAL HIGH (ref 8–23)
CO2: 26 mmol/L (ref 22–32)
Calcium: 7.9 mg/dL — ABNORMAL LOW (ref 8.9–10.3)
Chloride: 108 mmol/L (ref 98–111)
Creatinine, Ser: 0.78 mg/dL (ref 0.44–1.00)
GFR, Estimated: >60 mL/min (ref >60)
Glucose, Bld: 115 mg/dL — ABNORMAL HIGH (ref 70–99)
Potassium: 3.8 mmol/L (ref 3.5–5.1)
Sodium: 143 mmol/L (ref 135–145)

## 2023-08-08 LAB — PHOSPHORUS: Phosphorus: 2.9 mg/dL (ref 2.5–4.6)

## 2023-08-08 LAB — MAGNESIUM: Magnesium: 2.4 mg/dL (ref 1.7–2.4)

## 2023-08-08 MED ORDER — DIATRIZOATE MEGLUMINE & SODIUM 66-10 % PO SOLN
90.0000 mL | Freq: Once | ORAL | Status: AC
  Administered 2023-08-08: 90 mL via NASOGASTRIC
  Filled 2023-08-08: qty 90

## 2023-08-08 NOTE — Progress Notes (Signed)
 Subjective/Chief Complaint: Feels much better but no flatus/bm yet   Objective: Vital signs in last 24 hours: Temp:  [97.8 F (36.6 C)-97.9 F (36.6 C)] 97.8 F (36.6 C) (01/15 0548) Pulse Rate:  [79-108] 99 (01/15 0548) Resp:  [20] 20 (01/15 0548) BP: (162-175)/(74-88) 162/74 (01/15 0548) SpO2:  [99 %] 99 % (01/15 0548) Last BM Date : 08/04/23  Intake/Output from previous day: 01/14 0701 - 01/15 0700 In: -  Out: 1600 [Urine:1000; Emesis/NG output:600] Intake/Output this shift: Total I/O In: 734.2 [I.V.:734.2] Out: -   Ab much softer, nontender nondistended  Lab Results:  Recent Labs    08/07/23 0517 08/08/23 0539  WBC 4.9 5.0  HGB 13.2 13.8  HCT 39.9 42.7  PLT 190 219   BMET Recent Labs    08/07/23 0517 08/08/23 0539  NA 137 143  K 3.9 3.8  CL 104 108  CO2 26 26  GLUCOSE 111* 115*  BUN 24* 29*  CREATININE 0.66 0.78  CALCIUM  8.0* 7.9*   PT/INR No results for input(s): "LABPROT", "INR" in the last 72 hours. ABG No results for input(s): "PHART", "HCO3" in the last 72 hours.  Invalid input(s): "PCO2", "PO2"  Studies/Results: DG Abd Portable 1V-Small Bowel Obstruction Protocol-initial, 8 hr delay Result Date: 08/07/2023 CLINICAL DATA:  Small bowel protocol, 8 hour post contrast filled EXAM: PORTABLE ABDOMEN - 1 VIEW COMPARISON:  08/06/2023 FINDINGS: NG tube tip is near the GE junction. Dilated small bowel loops are again noted measuring up to 4.6 cm in diameter. Contrast material seen within dilated small bowel loops. No contrast within the colon. IMPRESSION: Continued small bowel obstruction pattern. No contrast yet within the colon. Electronically Signed   By: Janeece Mechanic M.D.   On: 08/07/2023 09:23   DG Abd Portable 1 View Result Date: 08/06/2023 CLINICAL DATA:  verify ng placement EXAM: PORTABLE ABDOMEN - 1 VIEW COMPARISON:  CT abdomen pelvis 08/06/2023 FINDINGS: Enteric tube coursing below the diaphragm with tip overlying the expected region of  the gastric lumen and side port overlying the expected region of the gastroesophageal junction. The heart and mediastinal contours are within normal limits. Of sclerotic plaque. No focal consolidation. No pulmonary edema. No pleural effusion. No pneumothorax. Multiple loops of small bowel dilated with gas within the upper abdomen. No acute osseous abnormality. IMPRESSION: 1. Enteric tube coursing below the diaphragm with tip overlying the expected region of the gastric lumen and side port overlying the expected region of the gastroesophageal junction. Recommend advancement by 2 cm. 2. Small-bowel obstruction. Electronically Signed   By: Morgane  Naveau M.D.   On: 08/06/2023 18:06   CT ABDOMEN PELVIS W CONTRAST Result Date: 08/06/2023 CLINICAL DATA:  Diffuse abdominal distention. Abdominal pain. Nonlocalized. EXAM: CT ABDOMEN AND PELVIS WITH CONTRAST TECHNIQUE: Multidetector CT imaging of the abdomen and pelvis was performed using the standard protocol following bolus administration of intravenous contrast. RADIATION DOSE REDUCTION: This exam was performed according to the departmental dose-optimization program which includes automated exposure control, adjustment of the mA and/or kV according to patient size and/or use of iterative reconstruction technique. CONTRAST:  80mL OMNIPAQUE  IOHEXOL  300 MG/ML  SOLN COMPARISON:  None Available. FINDINGS: Lower chest: Bibasilar subsegmental atelectasis. Mild cardiomegaly, without pericardial or pleural effusion. Tiny hiatal hernia. Hepatobiliary: Normal liver. Cholecystectomy. Mild intrahepatic biliary duct dilatation. Moderate extrahepatic duct dilatation at 2.0 cm on coronal image 47. Followed to the level of the ampulla with gradual tapering. No calcified stone or obstructive mass identified. Pancreas: Mild pancreatic atrophy. No  duct dilatation or acute inflammation. Spleen: Normal in size, without focal abnormality. Adrenals/Urinary Tract: Normal adrenal glands. Normal  kidneys, without hydronephrosis. Normal urinary bladder. Stomach/Bowel: Normal remainder of the stomach. The colon is relatively decompressed especially distally Mid ileal loops are dilated and fluid-filled including up to 4.1 cm in 31/2. There are at least 2 small bowel transitions occurring adjacent to each other in the pelvis. The most well-defined is identified on coronal image 50 through 37. Just inferior and anterior to this, a second transition is identified on images 59 through 53 coronal. There may be a more caudal transition with small bowel feces sign on 47 through 43 of series 5 coronal. There is a suspicion of mesenteric edema within the pelvis including on 65/2. No pneumatosis or free intraperitoneal air. Vascular/Lymphatic: Aortic atherosclerosis. Retroaortic left renal vein. No abdominopelvic adenopathy. Reproductive: Hysterectomy.  No adnexal mass. Other: Small volume abdominopelvic ascites. Musculoskeletal: Soft tissue density in the inferior right breast on 01/02 likely represents asymmetric parenchyma when correlated with 04/08/2023 chest CT (negative screening mammogram 09/28/2022). Osteopenia. L4-5 trans pedicle screw fixation. Trace L4-5 anterolisthesis. Lumbar spondylosis. IMPRESSION: 1. Small-bowel obstruction with multiple adjacent transitions within the pelvis, suspicious for closed loop obstruction and possible internal hernia. Recommend surgical consultation. 2. Small volume abdominopelvic ascites with suggestion of mesenteric edema adjacent to the presumed close loop obstruction. Cannot exclude complicating ischemia. 3. Cholecystectomy with biliary duct dilatation. Correlate with bilirubin levels and if elevated consider nonemergent outpatient pre and post-contrast MRCP. 4.  Tiny hiatal hernia. 5.  Aortic Atherosclerosis (ICD10-I70.0). These results will be called to the ordering clinician or representative by the Radiologist Assistant, and communication documented in the PACS or [employer]. Electronically Signed   By: Lore Rode M.D.   On: 08/06/2023 12:48    Anti-infectives: Anti-infectives (From admission, onward)    None       Assessment/Plan: SBO  - CT with SBO with multiple transitions in the pelvis, ?internal hernia, small volume ascites, cholecystectomy, tiny hiatal hernia  - no leukocytosis, lactic WNL at 0.9, HD stable - not consistent with closed loop obstruction  - hx of multiple prior abdominal surgeries - I was prepared to operate on her today but although no flatus her exam is much better,  xray not a lot better but I think reasonable given everything to wait another 24 hours to see if this resolves. I may give another dose of gg today to see if that helps.  If she is not passing flatus/bm tomorrow will go to or. Discussed this plan with her and son.    FEN: NPO, IVF per [facility], NGT to LIWS  VTE: lovenox  ID: no current abx   - per [facility] -  Hx of CVA RLS Fibromyalgia HTN Hx of Hep C Graves disease   Enid Harry 08/08/2023

## 2023-08-08 NOTE — Progress Notes (Signed)
 Per MD, NGT advanced 4cm.

## 2023-08-08 NOTE — Progress Notes (Signed)
 [facility]   NOTE Brooke Sanders ZOX:[id number]  DOB: [birth year]  DOA: 08/06/2023  PCP: [place], Betty G, MD  08/08/2023,7:53 AM   LOS: 2 days      Code Status: Full From: Home  current Dispo: Unclear   78 year old white female Sarcoidosis diagnosed 1980+ positive cervical node biopsy--history of vertebral fracture-chondrocalcinosis-follows with [facility] Known history of allergies, chronic cough and rhinitis follows with ENT Hepatitis C Prior stroke 2001 Graves' disease RLS Previous cholecystectomy hysterectomy?  Ectopic ovary--- prior SBO 2010 [?]  Events 1/13 came to emergency room nausea vomiting poor p.o. intake abdominal distention-CT scan SBO?-sodium 137 potassium 4.1 BUN/creatinine 26/0.7 LFTs normal White count normal  General Surgery consulted, NG tube placed-gastrograffin attempted  procedures 1/13 CT scan?  Multiple adjacent transitions?  Closed-loop obstruction possible internal hernia-cholecystotomy with biliary duct dilatation-small volume ascites and mesenteric edema    Plan  Small bowel obstruction?  Internal hernia Gastrografin  reattempted 1/15-despite conservative management has not improved so may require surgery I will defer to general surgery-keep NG tube in place-continue morphine  2-4 for pain every 2 as needed-continue Protonix  40 every 24 May require TPN? AKI on admission Relatively stable labs-monitor trends-keep on IVF 75 cc/H Sarcoidosis clinically quiescent Monitor trends-on no specific modifying therapy  Hepatitis C history Prior stroke 2001 Holding at this time Pravachol  10 ASA 81 HTN Metoprolol  XL 25 held HCTZ 12.5 held-monitor trends on as needed hydralazine  IV for blood pressure >160/100 Graves' disease Supposed to be on Synthroid  88 every morning-May need to start IV replacement if prolonged n.p.o. state RLS  DVT prophylaxis: Lovenox   Status is: Inpatient Remains inpatient appropriate because:   Requires possible  surgery      Subjective: Still distended no flatus no stool no chest pain some overall abdominal pain  Objective + exam Vitals:   08/07/23 1451 08/07/23 1752 08/07/23 2038 08/08/23 0548  BP: (!) 167/88 (!) 175/85 (!) 170/77 (!) 162/74  Pulse: 79 80 (!) 108 99  Resp: 20  20 20   Temp: 97.8 F (36.6 C)  97.9 F (36.6 C) 97.8 F (36.6 C)  TempSrc: Oral  Oral Oral  SpO2: 99% 99% 99% 99%   There were no vitals filed for this visit.  Examination: EOMI NCAT no focal deficit no icterus no pallor No wheeze rales rhonchi S1-S2 no murmur ROM intact power 5/5 Abdomen quite distended no rebound no guarding  Data Reviewed: reviewed   CBC    Component Value Date/Time   WBC 5.0 08/08/2023 0539   RBC 4.48 08/08/2023 0539   HGB 13.8 08/08/2023 0539   HCT 42.7 08/08/2023 0539   PLT 219 08/08/2023 0539   MCV 95.3 08/08/2023 0539   MCH 30.8 08/08/2023 0539   MCHC 32.3 08/08/2023 0539   RDW 12.8 08/08/2023 0539   LYMPHSABS 0.8 08/06/2023 1847   MONOABS 1.2 (H) 08/06/2023 1847   EOSABS 0.0 08/06/2023 1847   BASOSABS 0.0 08/06/2023 1847      Latest Ref Rng & Units 08/08/2023    5:39 AM 08/07/2023    5:17 AM 08/06/2023    6:47 PM  CMP  Glucose 70 - 99 mg/dL 811  914  782   BUN 8 - 23 mg/dL 29  24  23    Creatinine 0.44 - 1.00 mg/dL 9.56  2.13  0.86   Sodium 135 - 145 mmol/L 143  137  137   Potassium 3.5 - 5.1 mmol/L 3.8  3.9  4.0   Chloride 98 - 111 mmol/L 108  104  103   CO2 22 - 32 mmol/L 26  26  24    Calcium  8.9 - 10.3 mg/dL 7.9  8.0  8.3   Total Protein 6.5 - 8.1 g/dL   6.6   Total Bilirubin 0.0 - 1.2 mg/dL   0.9   Alkaline Phos 38 - 126 U/L   37   AST 15 - 41 U/L   17   ALT 0 - 44 U/L   15     Scheduled Meds:  enoxaparin  (LOVENOX ) injection  40 mg Subcutaneous Q24H   pantoprazole  (PROTONIX ) IV  40 mg Intravenous Q24H   Continuous Infusions:  sodium chloride  75 mL/hr at 08/08/23 2956    Time 47  Jai-Gurmukh Salomon Ganser, MD  [facility]

## 2023-08-08 NOTE — Progress Notes (Signed)
   08/08/23 1507  Assess: MEWS Score  Temp 97.6 F (36.4 C)  BP (!) 173/85  MAP (mmHg) 108  Pulse Rate (!) 122  Resp 18  SpO2 97 %  O2 Device Room Air  Assess: MEWS Score  MEWS Temp 0  MEWS Systolic 0  MEWS Pulse 2  MEWS RR 0  MEWS LOC 0  MEWS Score 2  MEWS Score Color Yellow  Assess: if the MEWS score is Yellow or Red  Were vital signs accurate and taken at a resting state? Yes  Does the patient meet 2 or more of the SIRS criteria? No  MEWS guidelines implemented  Yes, yellow  Treat  MEWS Interventions Considered administering scheduled or prn medications/treatments as ordered  Take Vital Signs  Increase Vital Sign Frequency  Yellow: Q2hr x1, continue Q4hrs until patient remains green for 12hrs  Escalate  MEWS: Escalate Yellow: Discuss with charge nurse and consider notifying provider and/or RRT  Notify: Charge Nurse/RN  Name of Charge Nurse/RN Wellsite geologist, RN  Assess: SIRS CRITERIA  SIRS Temperature  0  SIRS Respirations  0  SIRS Pulse 1  SIRS WBC 0  SIRS Score Sum  1

## 2023-08-08 NOTE — Plan of Care (Signed)

## 2023-08-09 ENCOUNTER — Other Ambulatory Visit: Payer: Self-pay

## 2023-08-09 ENCOUNTER — Inpatient Hospital Stay (HOSPITAL_COMMUNITY): Payer: Medicare Other | Admitting: Anesthesiology

## 2023-08-09 ENCOUNTER — Encounter (HOSPITAL_COMMUNITY): Payer: Self-pay

## 2023-08-09 ENCOUNTER — Inpatient Hospital Stay (HOSPITAL_COMMUNITY): Payer: Medicare Other

## 2023-08-09 ENCOUNTER — Encounter (HOSPITAL_COMMUNITY): Admission: EM | Disposition: E | Payer: Self-pay | Source: Ambulatory Visit | Attending: Pulmonary Disease

## 2023-08-09 DIAGNOSIS — R571 Hypovolemic shock: Secondary | ICD-10-CM | POA: Diagnosis not present

## 2023-08-09 DIAGNOSIS — I2510 Atherosclerotic heart disease of native coronary artery without angina pectoris: Secondary | ICD-10-CM | POA: Diagnosis not present

## 2023-08-09 DIAGNOSIS — I10 Essential (primary) hypertension: Secondary | ICD-10-CM | POA: Diagnosis not present

## 2023-08-09 DIAGNOSIS — J9601 Acute respiratory failure with hypoxia: Secondary | ICD-10-CM

## 2023-08-09 DIAGNOSIS — K56609 Unspecified intestinal obstruction, unspecified as to partial versus complete obstruction: Secondary | ICD-10-CM

## 2023-08-09 DIAGNOSIS — E7800 Pure hypercholesterolemia, unspecified: Secondary | ICD-10-CM | POA: Diagnosis not present

## 2023-08-09 DIAGNOSIS — R0689 Other abnormalities of breathing: Secondary | ICD-10-CM

## 2023-08-09 HISTORY — PX: LAPAROTOMY: SHX154

## 2023-08-09 LAB — CBC WITH DIFFERENTIAL/PLATELET
Abs Immature Granulocytes: 0.47 10*3/uL — ABNORMAL HIGH (ref 0.00–0.07)
Abs Immature Granulocytes: 0.38 10*3/uL — ABNORMAL HIGH (ref 0.00–0.07)
Basophils Absolute: 0 10*3/uL (ref 0.0–0.1)
Basophils Absolute: 0 10*3/uL (ref 0.0–0.1)
Basophils Relative: 0 %
Basophils Relative: 1 %
Eosinophils Absolute: 0 10*3/uL (ref 0.0–0.5)
Eosinophils Absolute: 0 10*3/uL (ref 0.0–0.5)
Eosinophils Relative: 0 %
Eosinophils Relative: 1 %
HCT: 45.5 % (ref 36.0–46.0)
HCT: 45.6 % (ref 36.0–46.0)
Hemoglobin: 14.4 g/dL (ref 12.0–15.0)
Hemoglobin: 14.2 g/dL (ref 12.0–15.0)
Immature Granulocytes: 6 %
Immature Granulocytes: 9 %
Lymphocytes Relative: 12 %
Lymphocytes Relative: 16 %
Lymphs Abs: 1 10*3/uL (ref 0.7–4.0)
Lymphs Abs: 0.7 10*3/uL (ref 0.7–4.0)
MCH: 30.6 pg (ref 26.0–34.0)
MCH: 30.5 pg (ref 26.0–34.0)
MCHC: 31.6 g/dL (ref 30.0–36.0)
MCHC: 31.1 g/dL (ref 30.0–36.0)
MCV: 96.6 fL (ref 80.0–100.0)
MCV: 97.9 fL (ref 80.0–100.0)
Monocytes Absolute: 1.8 10*3/uL — ABNORMAL HIGH (ref 0.1–1.0)
Monocytes Absolute: 0 10*3/uL — ABNORMAL LOW (ref 0.1–1.0)
Monocytes Relative: 21 %
Monocytes Relative: 1 %
Neutro Abs: 5.1 10*3/uL (ref 1.7–7.7)
Neutro Abs: 3.2 10*3/uL (ref 1.7–7.7)
Neutrophils Relative %: 61 %
Neutrophils Relative %: 72 %
Platelet Morphology: NORMAL
Platelets: 253 10*3/uL (ref 150–400)
Platelets: 244 10*3/uL (ref 150–400)
RBC: 4.71 MIL/uL (ref 3.87–5.11)
RBC: 4.66 MIL/uL (ref 3.87–5.11)
RDW: 13 % (ref 11.5–15.5)
RDW: 13 % (ref 11.5–15.5)
WBC: 8.3 10*3/uL (ref 4.0–10.5)
WBC: 4.3 10*3/uL (ref 4.0–10.5)
nRBC: 0 % (ref 0.0–0.2)
nRBC: 0 % (ref 0.0–0.2)

## 2023-08-09 LAB — BLOOD GAS, ARTERIAL
Acid-base deficit: 5.9 mmol/L — ABNORMAL HIGH (ref 0.0–2.0)
Bicarbonate: 21.1 mmol/L (ref 20.0–28.0)
Drawn by: 20012
FIO2: 50 %
MECHVT: 360 mL
O2 Saturation: 99.4 %
PEEP: 5 cmH2O
Patient temperature: 36.3
RATE: 18 {breaths}/min
pCO2 arterial: 45 mm[Hg] (ref 32–48)
pH, Arterial: 7.28 — ABNORMAL LOW (ref 7.35–7.45)
pO2, Arterial: 141 mm[Hg] — ABNORMAL HIGH (ref 83–108)

## 2023-08-09 LAB — COMPREHENSIVE METABOLIC PANEL
ALT: 11 U/L (ref 0–44)
ALT: 13 U/L (ref 0–44)
AST: 12 U/L — ABNORMAL LOW (ref 15–41)
AST: 19 U/L (ref 15–41)
Albumin: 3.2 g/dL — ABNORMAL LOW (ref 3.5–5.0)
Albumin: 3.2 g/dL — ABNORMAL LOW (ref 3.5–5.0)
Alkaline Phosphatase: 34 U/L — ABNORMAL LOW (ref 38–126)
Alkaline Phosphatase: 37 U/L — ABNORMAL LOW (ref 38–126)
Anion gap: 12 (ref 5–15)
Anion gap: 11 (ref 5–15)
BUN: 34 mg/dL — ABNORMAL HIGH (ref 8–23)
BUN: 33 mg/dL — ABNORMAL HIGH (ref 8–23)
CO2: 22 mmol/L (ref 22–32)
CO2: 21 mmol/L — ABNORMAL LOW (ref 22–32)
Calcium: 7.8 mg/dL — ABNORMAL LOW (ref 8.9–10.3)
Calcium: 7.2 mg/dL — ABNORMAL LOW (ref 8.9–10.3)
Chloride: 111 mmol/L (ref 98–111)
Chloride: 117 mmol/L — ABNORMAL HIGH (ref 98–111)
Creatinine, Ser: 0.63 mg/dL (ref 0.44–1.00)
Creatinine, Ser: 0.99 mg/dL (ref 0.44–1.00)
GFR, Estimated: >60 mL/min (ref >60)
GFR, Estimated: 58 mL/min — ABNORMAL LOW (ref >60)
Glucose, Bld: 113 mg/dL — ABNORMAL HIGH (ref 70–99)
Glucose, Bld: 115 mg/dL — ABNORMAL HIGH (ref 70–99)
Potassium: 3.8 mmol/L (ref 3.5–5.1)
Potassium: 3.4 mmol/L — ABNORMAL LOW (ref 3.5–5.1)
Sodium: 145 mmol/L (ref 135–145)
Sodium: 149 mmol/L — ABNORMAL HIGH (ref 135–145)
Total Bilirubin: 0.6 mg/dL (ref 0.0–1.2)
Total Bilirubin: 0.6 mg/dL (ref 0.0–1.2)
Total Protein: 5.9 g/dL — ABNORMAL LOW (ref 6.5–8.1)
Total Protein: 5.1 g/dL — ABNORMAL LOW (ref 6.5–8.1)

## 2023-08-09 LAB — MAGNESIUM
Magnesium: 2.6 mg/dL — ABNORMAL HIGH (ref 1.7–2.4)
Magnesium: 2.1 mg/dL (ref 1.7–2.4)

## 2023-08-09 LAB — MRSA NEXT GEN BY PCR, NASAL: MRSA by PCR Next Gen: NOT DETECTED

## 2023-08-09 LAB — SURGICAL PCR SCREEN
MRSA, PCR: NEGATIVE
Staphylococcus aureus: POSITIVE — AB

## 2023-08-09 LAB — LACTIC ACID, PLASMA: Lactic Acid, Venous: 1.2 mmol/L (ref 0.5–1.9)

## 2023-08-09 LAB — PHOSPHORUS
Phosphorus: 2 mg/dL — ABNORMAL LOW (ref 2.5–4.6)
Phosphorus: 4.3 mg/dL (ref 2.5–4.6)

## 2023-08-09 SURGERY — LAPAROTOMY, EXPLORATORY
Anesthesia: General

## 2023-08-09 MED ORDER — PHENYLEPHRINE HCL-NACL 20-0.9 MG/250ML-% IV SOLN
INTRAVENOUS | Status: DC | PRN
  Administered 2023-08-09: 30 ug/min via INTRAVENOUS

## 2023-08-09 MED ORDER — PROPOFOL 1000 MG/100ML IV EMUL
0.0000 ug/kg/min | INTRAVENOUS | Status: DC
  Administered 2023-08-09 (×2): 50 ug/kg/min via INTRAVENOUS
  Administered 2023-08-10: 35 ug/kg/min via INTRAVENOUS
  Administered 2023-08-10: 50 ug/kg/min via INTRAVENOUS
  Filled 2023-08-09 (×5): qty 100

## 2023-08-09 MED ORDER — PROPOFOL 10 MG/ML IV BOLUS
INTRAVENOUS | Status: DC | PRN
  Administered 2023-08-09: 30 ug/kg/min via INTRAVENOUS
  Administered 2023-08-09: 90 mg via INTRAVENOUS

## 2023-08-09 MED ORDER — METOPROLOL TARTRATE 5 MG/5ML IV SOLN: 2.5000 mg | Freq: Four times a day (QID) | INTRAVENOUS | Status: DC | PRN

## 2023-08-09 MED ORDER — 0.9 % SODIUM CHLORIDE (POUR BTL) OPTIME
TOPICAL | Status: DC | PRN
  Administered 2023-08-09 (×2): 1000 mL

## 2023-08-09 MED ORDER — SODIUM CHLORIDE 0.9 % IV SOLN: INTRAVENOUS | Status: AC

## 2023-08-09 MED ORDER — MIDAZOLAM HCL 2 MG/2ML IJ SOLN
INTRAMUSCULAR | Status: AC
  Filled 2023-08-09: qty 2

## 2023-08-09 MED ORDER — FENTANYL CITRATE PF 50 MCG/ML IJ SOSY
25.0000 ug | PREFILLED_SYRINGE | INTRAMUSCULAR | Status: DC | PRN
Start: 2023-08-09 — End: 2023-08-09

## 2023-08-09 MED ORDER — ORAL CARE MOUTH RINSE
15.0000 mL | OROMUCOSAL | Status: DC
  Administered 2023-08-09 – 2023-08-15 (×60): 15 mL via OROMUCOSAL

## 2023-08-09 MED ORDER — FENTANYL CITRATE (PF) 100 MCG/2ML IJ SOLN
INTRAMUSCULAR | Status: AC
  Filled 2023-08-09: qty 2

## 2023-08-09 MED ORDER — PHENYLEPHRINE HCL (PRESSORS) 10 MG/ML IV SOLN
INTRAVENOUS | Status: DC | PRN
  Administered 2023-08-09: 80 ug via INTRAVENOUS
  Administered 2023-08-09: 120 ug via INTRAVENOUS
  Administered 2023-08-09: 80 ug via INTRAVENOUS

## 2023-08-09 MED ORDER — FENTANYL 2500MCG IN NS 250ML (10MCG/ML) PREMIX INFUSION
25.0000 ug/h | INTRAVENOUS | Status: DC
Start: 2023-08-09 — End: 2023-08-15
  Administered 2023-08-09: 25 ug/h via INTRAVENOUS
  Administered 2023-08-11 – 2023-08-12 (×3): 200 ug/h via INTRAVENOUS
  Administered 2023-08-12: 175 ug/h via INTRAVENOUS
  Administered 2023-08-13 (×2): 150 ug/h via INTRAVENOUS
  Administered 2023-08-14: 175 ug/h via INTRAVENOUS
  Administered 2023-08-15: 200 ug/h via INTRAVENOUS
  Filled 2023-08-09 (×9): qty 250

## 2023-08-09 MED ORDER — MIDAZOLAM HCL 5 MG/5ML IJ SOLN
INTRAMUSCULAR | Status: DC | PRN
  Administered 2023-08-09: 1 mg via INTRAVENOUS

## 2023-08-09 MED ORDER — HYDROMORPHONE HCL 1 MG/ML IJ SOLN
INTRAMUSCULAR | Status: DC | PRN
  Administered 2023-08-09 (×2): .4 mg via INTRAVENOUS

## 2023-08-09 MED ORDER — LACTATED RINGERS IV BOLUS
1000.0000 mL | Freq: Once | INTRAVENOUS | Status: AC
  Administered 2023-08-09: 1000 mL via INTRAVENOUS

## 2023-08-09 MED ORDER — NAPHAZOLINE-GLYCERIN 0.012-0.25 % OP SOLN
1.0000 [drp] | Freq: Four times a day (QID) | OPHTHALMIC | Status: DC | PRN
  Filled 2023-08-09: qty 15

## 2023-08-09 MED ORDER — ORAL CARE MOUTH RINSE: 15.0000 mL | OROMUCOSAL | Status: DC | PRN

## 2023-08-09 MED ORDER — LACTATED RINGERS IV SOLN
INTRAVENOUS | Status: AC
Start: 2023-08-09 — End: 2023-08-10

## 2023-08-09 MED ORDER — MUPIROCIN 2 % EX OINT
1.0000 | TOPICAL_OINTMENT | Freq: Two times a day (BID) | CUTANEOUS | Status: AC
  Administered 2023-08-09 – 2023-08-14 (×10): 1 via NASAL
  Filled 2023-08-09: qty 22

## 2023-08-09 MED ORDER — METOPROLOL TARTRATE 5 MG/5ML IV SOLN
2.5000 mg | Freq: Four times a day (QID) | INTRAVENOUS | Status: DC | PRN
Start: 2023-08-09 — End: 2023-08-09
  Administered 2023-08-09: 2.5 mg via INTRAVENOUS
  Filled 2023-08-09: qty 5

## 2023-08-09 MED ORDER — CHLORHEXIDINE GLUCONATE 0.12 % MT SOLN
15.0000 mL | Freq: Once | OROMUCOSAL | Status: AC
  Administered 2023-08-09: 15 mL via OROMUCOSAL

## 2023-08-09 MED ORDER — PROPOFOL 10 MG/ML IV BOLUS
INTRAVENOUS | Status: AC
  Filled 2023-08-09: qty 20

## 2023-08-09 MED ORDER — MIDAZOLAM HCL 2 MG/2ML IJ SOLN
2.0000 mg | Freq: Once | INTRAMUSCULAR | Status: AC
  Administered 2023-08-09: 2 mg via INTRAVENOUS

## 2023-08-09 MED ORDER — FENTANYL CITRATE (PF) 100 MCG/2ML IJ SOLN
100.0000 ug | Freq: Once | INTRAMUSCULAR | Status: AC
  Administered 2023-08-09: 100 ug via INTRAVENOUS

## 2023-08-09 MED ORDER — FENTANYL CITRATE PF 50 MCG/ML IJ SOSY
25.0000 ug | PREFILLED_SYRINGE | Freq: Once | INTRAMUSCULAR | Status: AC
  Administered 2023-08-09: 25 ug via INTRAVENOUS
  Filled 2023-08-09: qty 1

## 2023-08-09 MED ORDER — POTASSIUM PHOSPHATES 15 MMOLE/5ML IV SOLN
15.0000 mmol | Freq: Once | INTRAVENOUS | Status: AC
  Administered 2023-08-09: 15 mmol via INTRAVENOUS
  Filled 2023-08-09: qty 5

## 2023-08-09 MED ORDER — SUGAMMADEX SODIUM 200 MG/2ML IV SOLN
INTRAVENOUS | Status: DC | PRN
  Administered 2023-08-09: 200 mg via INTRAVENOUS

## 2023-08-09 MED ORDER — LIDOCAINE HCL (CARDIAC) PF 100 MG/5ML IV SOSY
PREFILLED_SYRINGE | INTRAVENOUS | Status: DC | PRN
  Administered 2023-08-09: 50 mg via INTRAVENOUS

## 2023-08-09 MED ORDER — HYDROMORPHONE HCL 2 MG/ML IJ SOLN
INTRAMUSCULAR | Status: AC
  Filled 2023-08-09: qty 1

## 2023-08-09 MED ORDER — LACTATED RINGERS IV SOLN: INTRAVENOUS | Status: DC | PRN

## 2023-08-09 MED ORDER — DEXAMETHASONE SODIUM PHOSPHATE 10 MG/ML IJ SOLN
INTRAMUSCULAR | Status: DC | PRN
  Administered 2023-08-09: 6 mg via INTRAVENOUS

## 2023-08-09 MED ORDER — CHLORHEXIDINE GLUCONATE CLOTH 2 % EX PADS
6.0000 | MEDICATED_PAD | Freq: Every day | CUTANEOUS | Status: DC
  Administered 2023-08-09 – 2023-08-13 (×4): 6 via TOPICAL

## 2023-08-09 MED ORDER — ALBUMIN HUMAN 5 % IV SOLN: INTRAVENOUS | Status: DC | PRN

## 2023-08-09 MED ORDER — POLYVINYL ALCOHOL 1.4 % OP SOLN
1.0000 [drp] | OPHTHALMIC | Status: DC | PRN
  Administered 2023-08-09: 1 [drp] via OPHTHALMIC
  Filled 2023-08-09: qty 15

## 2023-08-09 MED ORDER — STERILE WATER FOR IRRIGATION IR SOLN
Status: DC | PRN
  Administered 2023-08-09 (×2): 1000 mL

## 2023-08-09 MED ORDER — MIDAZOLAM HCL 2 MG/2ML IJ SOLN
INTRAMUSCULAR | Status: AC
Start: 2023-08-09 — End: ?
  Filled 2023-08-09: qty 2

## 2023-08-09 MED ORDER — ROCURONIUM BROMIDE 100 MG/10ML IV SOLN
INTRAVENOUS | Status: DC | PRN
  Administered 2023-08-09: 35 mg via INTRAVENOUS

## 2023-08-09 MED ORDER — FENTANYL CITRATE (PF) 100 MCG/2ML IJ SOLN
INTRAMUSCULAR | Status: DC | PRN
  Administered 2023-08-09: 100 ug via INTRAVENOUS

## 2023-08-09 MED ORDER — SUCCINYLCHOLINE CHLORIDE 200 MG/10ML IV SOSY
PREFILLED_SYRINGE | INTRAVENOUS | Status: DC | PRN
  Administered 2023-08-09: 90 mg via INTRAVENOUS

## 2023-08-09 MED ORDER — ORAL CARE MOUTH RINSE: 15.0000 mL | Freq: Once | OROMUCOSAL | Status: DC

## 2023-08-09 MED ORDER — FENTANYL BOLUS VIA INFUSION
25.0000 ug | INTRAVENOUS | Status: AC | PRN
Start: 2023-08-09 — End: ?
  Administered 2023-08-13 (×2): 100 ug via INTRAVENOUS
  Administered 2023-08-13: 50 ug via INTRAVENOUS
  Administered 2023-08-13: 100 ug via INTRAVENOUS
  Administered 2023-08-13: 25 ug via INTRAVENOUS
  Administered 2023-08-14: 100 ug via INTRAVENOUS
  Administered 2023-08-14: 50 ug via INTRAVENOUS
  Administered 2023-08-14 (×5): 100 ug via INTRAVENOUS
  Administered 2023-08-14 (×2): 50 ug via INTRAVENOUS
  Administered 2023-08-14 – 2023-08-15 (×3): 100 ug via INTRAVENOUS

## 2023-08-09 MED ORDER — PIPERACILLIN-TAZOBACTAM 3.375 G IVPB
3.3750 g | Freq: Three times a day (TID) | INTRAVENOUS | Status: DC
  Administered 2023-08-09 – 2023-08-10 (×5): 3.375 g via INTRAVENOUS
  Filled 2023-08-09 (×4): qty 50

## 2023-08-09 MED ORDER — CHLORHEXIDINE GLUCONATE 0.12 % MT SOLN: 15.0000 mL | Freq: Once | OROMUCOSAL | Status: DC

## 2023-08-09 MED ORDER — POTASSIUM CHLORIDE 10 MEQ/100ML IV SOLN
10.0000 meq | INTRAVENOUS | Status: AC
  Administered 2023-08-09 (×4): 10 meq via INTRAVENOUS
  Filled 2023-08-09 (×4): qty 100

## 2023-08-09 MED ORDER — METOPROLOL TARTRATE 5 MG/5ML IV SOLN: 2.5000 mg | Freq: Four times a day (QID) | INTRAVENOUS | Status: DC

## 2023-08-09 MED ORDER — CEFTRIAXONE SODIUM 2 G IJ SOLR
2.0000 g | INTRAMUSCULAR | Status: AC
  Administered 2023-08-09: 2 g via INTRAVENOUS
  Filled 2023-08-09: qty 20

## 2023-08-09 MED ORDER — LACTATED RINGERS IV SOLN: INTRAVENOUS | Status: DC

## 2023-08-09 SURGICAL SUPPLY — 41 items
BAG COUNTER SPONGE SURGICOUNT (BAG) IMPLANT
BLADE EXTENDED COATED 6.5IN (ELECTRODE) IMPLANT
CHLORAPREP W/TINT 26 (MISCELLANEOUS) ×1 IMPLANT
COVER MAYO STAND STRL (DRAPES) ×3 IMPLANT
COVER SURGICAL LIGHT HANDLE (MISCELLANEOUS) ×1 IMPLANT
DRAPE LAPAROSCOPIC ABDOMINAL (DRAPES) ×1 IMPLANT
DRAPE UTILITY XL STRL (DRAPES) IMPLANT
DRAPE WARM FLUID 44X44 (DRAPES) IMPLANT
DRSG OPSITE POSTOP 4X10 (GAUZE/BANDAGES/DRESSINGS) IMPLANT
DRSG OPSITE POSTOP 4X6 (GAUZE/BANDAGES/DRESSINGS) IMPLANT
DRSG OPSITE POSTOP 4X8 (GAUZE/BANDAGES/DRESSINGS) IMPLANT
ELECT REM PT RETURN 15FT ADLT (MISCELLANEOUS) ×1 IMPLANT
GAUZE SPONGE 4X4 12PLY STRL (GAUZE/BANDAGES/DRESSINGS) IMPLANT
GLOVE BIO SURGEON STRL SZ7 (GLOVE) ×2 IMPLANT
GLOVE BIOGEL PI IND STRL 7.5 (GLOVE) ×2 IMPLANT
GOWN STRL REUS W/ TWL LRG LVL3 (GOWN DISPOSABLE) ×2 IMPLANT
HANDLE SUCTION POOLE (INSTRUMENTS) IMPLANT
KIT BASIN OR (CUSTOM PROCEDURE TRAY) IMPLANT
KIT TURNOVER KIT A (KITS) IMPLANT
LIGASURE IMPACT 36 18CM CVD LR (INSTRUMENTS) IMPLANT
PACK GENERAL/GYN (CUSTOM PROCEDURE TRAY) IMPLANT
PENCIL SMOKE EVACUATOR (MISCELLANEOUS) IMPLANT
RELOAD PROXIMATE 75MM BLUE (ENDOMECHANICALS) ×11 IMPLANT
RELOAD PROXIMATE TA60MM BLUE (ENDOMECHANICALS) ×2 IMPLANT
RELOAD STAPLE 60 BLU REG PROX (ENDOMECHANICALS) IMPLANT
RELOAD STAPLE 75 3.8 BLU REG (ENDOMECHANICALS) IMPLANT
SPONGE ABDOMINAL VAC ABTHERA (MISCELLANEOUS) IMPLANT
STAPLER GUN LINEAR PROX 60 (STAPLE) IMPLANT
STAPLER PROXIMATE 75MM BLUE (STAPLE) IMPLANT
STAPLER SKIN PROX WIDE 3.9 (STAPLE) ×1 IMPLANT
SUCTION POOLE HANDLE (INSTRUMENTS) ×1 IMPLANT
SUT NOVA NAB GS-21 0 18 T12 DT (SUTURE) IMPLANT
SUT NOVA NAB GS-21 1 T12 (SUTURE) IMPLANT
SUT PDS AB 1 TP1 96 (SUTURE) IMPLANT
SUT SILK 2 0 SH CR/8 (SUTURE) ×1 IMPLANT
SUT SILK 2-0 18XBRD TIE 12 (SUTURE) ×1 IMPLANT
SUT SILK 3 0 SH CR/8 (SUTURE) ×1 IMPLANT
SUT SILK 3-0 18XBRD TIE 12 (SUTURE) ×1 IMPLANT
TOWEL OR 17X26 10 PK STRL BLUE (TOWEL DISPOSABLE) IMPLANT
TRAY FOLEY MTR SLVR 14FR STAT (SET/KITS/TRAYS/PACK) IMPLANT
TRAY FOLEY MTR SLVR 16FR STAT (SET/KITS/TRAYS/PACK) IMPLANT

## 2023-08-09 NOTE — Consult Note (Signed)
NAME:  Danyal A. Maul, MRN:  [id number], DOB:  17-Apr-1946, LOS: 3 ADMISSION DATE:  08/06/2023, CONSULTATION DATE:  1/16 REFERRING MD:  Dr. Tedra Senegal, CHIEF COMPLAINT:  SBO   History of Present Illness:  78 year old female with PMH as below, which is significant for remote SBO, HTN, HLD, hypothyroid, and sarcoidosis. She presented to [facility] 1/13 with complaints of nausea, vomiting, and abdominal pain x 3 days. Onset was 1/10 about 3 hours after eating a meal. She assumed this was due to food poisoning. Symptoms progressed to include nausea/vomiting and diarrhea. She then began to no longer have bowel movements or pass gas. She was unable to keep any PO intake including medications down. When symptoms did not resolve she presented to [facility] ED. CT scan showed evidence of small bowel obstruction.  General surgery was consulted and NGT was placed for decompression. Despite conservative management she did not improve and the decision was made to proceed to the OR. 1/16 she underwent ex lap with lysis of adhesions, small bowel resection with primary anastomosis x 3, and right colectomy. The terminal ileum and the abdomen were left in discontinuity and she was transferred to the ICU on the ventilator. PCCM was asked to evaluate for ICU care.   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   Significant Hospital Events: Including procedures, antibiotic start and stop dates in addition to other pertinent events   1/13 admit for SBO with conservative management To OR for adhesion lysis, colectomy, and small bowel resection. Post op on vent with open abd.   Interim History / Subjective:    Objective   Blood pressure (!) 167/86, pulse (!) 101, temperature 99.2 F (37.3 C), temperature source Oral, resp. rate 16, height 5' (1.524 m), weight 54 kg, SpO2 96%.        Intake/Output Summary (Last 24 hours) at 08/09/2023 1431 Last data filed at 08/09/2023 1339 Gross per 24 hour  Intake 2101.8 ml  Output 625 ml  Net 1476.8 ml   Filed Weights   08/09/23 1030  Weight: 54 kg    Examination: General: Thin elderly female in NAD HENT: [place]/AT, PERRL, no JVD Lungs: Clear bilateral breath sounds Cardiovascular: RRR, no MRG Abdomen: wound vac in place draining minimal sanguinous fluid.  Extremities: No acute deformity or ROM limitation Neuro: awake on vent GU: Foley  Resolved Hospital Problem list     Assessment & Plan:   Small bowel obstruction: failed conservative management, taken to OR 1/16 for adhesion lysis, colectomy, and small bowel resection. Postoperatively she was brought to the ICU for recovery with bowel in discontinuity and open abdomen with plans for return to OR 1/17 - Admit to ICU - Management per surgery - NGT, NPO - Pain management with fentanyl infusion - Monitor wound vac output.  - For OR tomorrow morning  Respiratory insufficiency due to need for medical sedation.  - Full vent support - ABG  - CXR - VAP bundle - Propofol and fentanyl for RASS goal -3 to -4  SVT:  1/13-14 two separate episodes, resolved spontaneously - telemetry monitoring  Sarcoidosis: biopsy proven - on no chronic therapy, monitor.   HTN - holding home hydrochlorothiazide  Hypothyroid - Holding home therapy while NPO - may need to start IV  replacement.  Restless legs - holding home pregabalin   Best Practice (right click and "Reselect all SmartList Selections" daily)   Diet/type: NPO DVT prophylaxis LMWH Pressure ulcer(s): N/A GI prophylaxis: PPI Lines: Central line Foley:  Yes, and it is still needed Code Status:  full code Last date of multidisciplinary goals of care discussion [ ]   Labs   CBC: Recent Labs  Lab 08/06/23 1054 08/06/23 1847 08/07/23 0517 08/08/23 0539 08/09/23 0514  WBC 5.4 5.3 4.9 5.0 8.3  NEUTROABS 3.7 3.2  --   --  5.1  HGB 13.6 12.9 13.2 13.8 14.4  HCT 39.2 39.4 39.9 42.7 45.5  MCV 89.9 94.3 93.9 95.3 96.6  PLT 189 182 190 219 253    Basic Metabolic Panel: Recent Labs  Lab 08/06/23 1054 08/06/23 1847 08/07/23 0517 08/08/23 0539 08/09/23 0515  NA 137 137 137 143 145  K 4.1 4.0 3.9 3.8 3.8  CL 98 103 104 108 111  CO2 27 24 26 26 22   GLUCOSE 136* 118* 111* 115* 113*  BUN 26* 23 24* 29* 34*  CREATININE 0.71 0.63 0.66 0.78 0.63  CALCIUM 9.3 8.3* 8.0* 7.9* 7.8*  MG 1.8 2.6*  --  2.4 2.6*  PHOS  --   --   --  2.9 2.0*   GFR: Estimated Creatinine Clearance: 41 mL/min (by C-G formula based on SCr of 0.63 mg/dL). Recent Labs  Lab 08/06/23 1322 08/06/23 1847 08/07/23 0517 08/08/23 0539 08/09/23 0514  WBC  --  5.3 4.9 5.0 8.3  LATICACIDVEN 0.9 1.0  --   --   --     Liver Function Tests: Recent Labs  Lab 08/06/23 1054 08/06/23 1847 08/09/23 0515  AST 15 17 12*  ALT 12 15 11   ALKPHOS 38 37* 34*  BILITOT 1.1 0.9 0.6  PROT 6.9 6.6 5.9*  ALBUMIN 4.2 3.6 3.2*   Recent Labs  Lab 08/06/23 1054  LIPASE 15   No results for input(s): "AMMONIA" in the last 168 hours.  ABG No results found for: "PHART", "PCO2ART", "PO2ART", "HCO3", "TCO2", "ACIDBASEDEF", "O2SAT"   Coagulation Profile: No results for input(s): "INR", "PROTIME" in the last 168 hours.  Cardiac Enzymes: No results for input(s): "CKTOTAL", "CKMB", "CKMBINDEX", "TROPONINI" in the last 168  hours.  HbA1C: Hemoglobin A1C  Date/Time Value Ref Range Status  12/12/2022 10:36 AM 5.0 4.0 - 5.6 % Final    CBG: No results for input(s): "GLUCAP" in the last 168 hours.  Review of Systems:   Patient is encephalopathic and/or intubated; therefore, history has been obtained from chart review.    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   Surgical History:   Past Surgical History:  Procedure Laterality Date   ABDOMINAL HYSTERECTOMY  2000   CARPAL TUNNEL RELEASE Right 2009   CHOLECYSTECTOMY  1975   COLON SURGERY  2021   COLONOSCOPY     x3 in 6 months. 2020-2021   cystic ectopic ovary  2000   EYE SURGERY     LAMINECTOMY  1986   LUMBAR DISC SURGERY  1986   orbital decompression Bilateral 03/2011   ossified ganglion surgery Right 1974   SECONDARY INTRAOCULAR LENSE IMPLANTATION Right 05/2012   SKIN BIOPSY  05/22/2023   right leg   SPINE SURGERY     TOTAL THYROIDECTOMY  2011   TUMOR REMOVAL Left 10/06/2021   left leg     Social History:   reports that she has never smoked. She has been exposed to tobacco smoke. She has never used smokeless tobacco. She reports current alcohol use of about 2.0 standard drinks of alcohol per week. She reports that she does not use drugs.   Family History:  Her family history includes Heart disease in her brother and father;  Prostate cancer in her nephew; Stroke in her father; Varicose Veins in her mother; Vision loss in her brother, father, mother, and sister.   Allergies Allergies  Allergen Reactions   Prednisone     Other reaction(s): Other (See Comments) Pain, bloating     Home Medications  Prior to Admission medications   Medication Sig Start Date End Date Taking? Authorizing Provider  APPLE CIDER VINEGAR PO Take by mouth daily.   Yes [provider]  aspirin 81 MG EC tablet Take 1 tablet by mouth daily.   Yes [provider]  b complex vitamins tablet Take 1 tablet by mouth daily.   Yes [provider]  Calcium-Magnesium-Vitamin D (CITRACAL SLOW RELEASE PO) Take by mouth 2 (two) times daily.   Yes [provider]  fluticasone (FLONASE) 50 MCG/ACT nasal spray Place 1-2 sprays into the nose as needed for allergies. Aller Flo   Yes [provider]  hydrochlorothiazide (HYDRODIURIL) 25 MG tablet TAKE 1 TABLET(25 MG) BY MOUTH DAILY Patient taking differently: Take 12.5 mg by mouth daily. 07/11/23  Yes [place], Betty G, MD  ibuprofen (ADVIL) 800 MG tablet Take 400 mg by mouth every 8 (eight) hours as needed for moderate pain (pain score 4-6) or mild pain (pain score 1-3).   Yes [provider]  levothyroxine (SYNTHROID) 88 MCG tablet TAKE 1 TABLET BY MOUTH DAILY AT 6 AM Patient taking differently: Take 88 mcg by mouth daily before breakfast. 01/12/23  Yes [place], Betty G, MD  metoprolol succinate (TOPROL-XL) 25 MG 24 hr tablet TAKE 1 TABLET(25 MG) BY MOUTH DAILY Patient taking differently: Take 25 mg by mouth daily. 04/12/23  Yes Jodelle Red, MD  montelukast (SINGULAIR) 10 MG tablet TAKE 1 TABLET(10 MG) BY MOUTH AT BEDTIME Patient taking differently: Take 10 mg by mouth at bedtime. 01/12/23  Yes [place], Betty G, MD  OVER THE COUNTER MEDICATION Take 4 tablets by mouth daily. Nutrafol   Yes [provider]  Polyethyl Glycol-Propyl Glycol  (SYSTANE OP) Place 1 drop into both eyes in the morning and at bedtime.   Yes [provider]  pravastatin (PRAVACHOL) 10 MG tablet Take 1 tablet (10 mg total) by mouth daily. 05/15/23  Yes Jodelle Red, MD  pregabalin (LYRICA) 50 MG capsule TAKE 1 CAPSULE(50 MG) BY MOUTH TWICE DAILY Patient taking differently: Take 50 mg by mouth 2 (two) times daily. 07/16/23  Yes [place], Betty G, MD  psyllium (METAMUCIL) 58.6 % packet Take 1 packet by mouth daily as needed.   Yes [provider]  Critical care time: 42 minutes     Joneen Roach, AGACNP-BC [facility]  See Amion for personal pager PCCM on call pager [phone] until 7pm. Please call Elink 7p-7a. [phone]  08/09/2023 3:46 PM

## 2023-08-09 NOTE — Op Note (Signed)
Preoperative diagnosis: Small bowel obstruction Postoperative diagnosis: Perforated terminal ileum, small bowel obstruction secondary to adhesions Procedure: 1.  Exploratory laparotomy with lysis of adhesions x 1 hour 2.  Small bowel resection with primary anastomosis x 3 3.  Right colectomy 4.  Placement of ABThera Surgeon: Dr. Harden Mo Assistant: Trixie Deis, PA-C Estimated blood loss: Less than 100 cc Complications: None Drains: ABThera to the abdomen open Anesthesia: General Specimens: Small bowel, right colon Sponge no counts correct completion Disposition to ICU in critical condition  Indications: This is 78 year old female who underwent a CT scan on the 13th that showed a small bowel obstruction.  She was seen overnight and had an NG tube placed.  The following day she was doing fairly well and we tried to treat this conservatively.  She had a contra study that she failed.  This morning she was not really tender but remained distended without passing any flatus.  I discussed going to the operating room for laparotomy.  Procedure: After informed consent was obtained she was taken to the operating.  She was given antibiotics.  SCDs were in place.  She was placed under general esthesia without complication.  She already had a nasogastric tube and a Foley catheter was placed.  She was then prepped and draped in standard sterile surgical fashion.  Surgical timeout was then performed.  I reentered her old midline incision but not in its entirety.  She had no adhesions to the midline.  I was able to enter into this without difficulty.  I immediately upon entering her bowel was noted to be dusky and had a number of serosal tears present.  With some difficulty I lysed adhesions and was eventually able to free the bowel.  The bowel was very friable and where the serosal tails were I put several holes in it by just mobilizing it.  This was unavoidable during the operation.  I controlled  these with bowel clamps and 2-0 silk sutures.  Eventually I realized that the primary issue was a constricting band right at the terminal ileum where it joined with the cecum.  I released this band.  The cecum was necrotic.  There was a perforation underneath the band as well right at the terminal ileum on the cecum.  I then elected to remove the 3 segments of small bowel that I had either made a hole in or had a hole.  I then did primary anastomosis on these.  For the area on the cecum and the dead cecum as well as the terminal ileum I ended up performing a right colectomy.  I released the white line and rolled the colon medially.  I divided the terminal ileum and the transverse colon.  As the LigaSure to come across the mesentery.  I then passed this off the table.  I left the terminal ileum and the abdomen in discontinuity for right now.  The bowel was still mildly dusky.  I think that hopefully she will improve and I elected to place an ABThera after irrigating.  She will then be transported the ICU in critical condition.

## 2023-08-09 NOTE — Plan of Care (Signed)

## 2023-08-09 NOTE — Anesthesia Preprocedure Evaluation (Signed)
Anesthesia Evaluation  Patient identified by MRN, date of birth, ID band Patient awake    Reviewed: Allergy & Precautions, NPO status , Patient's Chart, lab work & pertinent test results, reviewed documented beta blocker date and time   Airway Mallampati: II  TM Distance: >3 FB Neck ROM: Full    Dental  (+) Dental Advisory Given, Teeth Intact, Chipped   Pulmonary pneumonia, resolved   Pulmonary exam normal breath sounds clear to auscultation       Cardiovascular hypertension, Pt. on home beta blockers and Pt. on medications + CAD  Normal cardiovascular exam Rhythm:Regular Rate:Normal     Neuro/Psych CVA    GI/Hepatic negative GI ROS,,,(+) Hepatitis -, C  Endo/Other  Hypothyroidism    Renal/GU negative Renal ROS     Musculoskeletal  (+) Arthritis ,  Fibromyalgia -  Abdominal   Peds  Hematology negative hematology ROS (+)   Anesthesia Other Findings   Reproductive/Obstetrics                             Anesthesia Physical Anesthesia Plan  ASA: 3 and emergent  Anesthesia Plan: General   Post-op Pain Management: Ofirmev IV (intra-op)*   Induction: Intravenous, Rapid sequence and Cricoid pressure planned  PONV Risk Score and Plan: 4 or greater and Ondansetron, Dexamethasone, Treatment may vary due to age or medical condition, Propofol infusion and TIVA  Airway Management Planned: Oral ETT  Additional Equipment:   Intra-op Plan:   Post-operative Plan: Extubation in OR  Informed Consent: I have reviewed the patients History and Physical, chart, labs and discussed the procedure including the risks, benefits and alternatives for the proposed anesthesia with the patient or authorized representative who has indicated his/her understanding and acceptance.     Dental advisory given  Plan Discussed with: CRNA  Anesthesia Plan Comments:         Anesthesia Quick Evaluation

## 2023-08-09 NOTE — Anesthesia Procedure Notes (Signed)
Procedure Name: Intubation Date/Time: 08/09/2023 12:29 PM  Performed by: Randa Evens, CRNAPre-anesthesia Checklist: Patient identified, Emergency Drugs available, Suction available and Patient being monitored Patient Re-evaluated:Patient Re-evaluated prior to induction Oxygen Delivery Method: Circle System Utilized Preoxygenation: Pre-oxygenation with 100% oxygen Induction Type: IV induction and Rapid sequence Laryngoscope Size: Mac and 3 Grade View: Grade I Tube type: Oral Tube size: 7.0 mm Number of attempts: 1 Airway Equipment and Method: Stylet and Oral airway Placement Confirmation: ETT inserted through vocal cords under direct vision, positive ETCO2 and breath sounds checked- equal and bilateral Secured at: 21 cm Tube secured with: Tape Dental Injury: Teeth and Oropharynx as per pre-operative assessment

## 2023-08-09 NOTE — Transfer of Care (Signed)
Immediate Anesthesia Transfer of Care Note  Patient: Brooke Sanders  Procedure(s) Performed: EXPLORATORY LAPAROTOMY, LYSIS OF ADHESIONS, SMALL BOWEL RESECTION, COLON RESECTION  Patient Location: ICU  Anesthesia Type:General  Level of Consciousness: sedated and Patient remains intubated per anesthesia plan  Airway & Oxygen Therapy: Patient remains intubated per anesthesia plan  Post-op Assessment: Report given to RN and Patient moving all extremities X 4  Post vital signs: Reviewed and stable  Last Vitals:  Vitals Value Taken Time  BP 154/63 08/09/23 1436  Temp    Pulse 96 08/09/23 1450  Resp 32 08/09/23 1450  SpO2 100 % 08/09/23 1450  Vitals shown include unfiled device data.  Last Pain:  Vitals:   08/09/23 1030  TempSrc: Oral  PainSc: 6          Complications: No notable events documented.

## 2023-08-09 NOTE — Progress Notes (Signed)
    Patient Name: Martinez A. Bertino           DOB: 1946/05/22  MRN: [id number]      Admission Date: 08/06/2023  Attending Provider: Rhetta Mura, MD  Primary Diagnosis: SBO (small bowel obstruction) (HCC)   Level of care: Telemetry    CROSS COVER NOTE   Date of Service   08/09/2023   Ashna A. Pichette, 78 y.o. female, was admitted on 08/06/2023 for SBO (small bowel obstruction) (HCC).    HPI/Events of Note   Temporary A-fib RVR, new onset.  HR > 130's; RN reporting as high as 200s (not sustaining) Asymptomatic, hemodynamically stable. Denies chest pain, palpitations, lightheadedness, shortness of breath during this episode. Patient transition back to sinus tachycardia without medical intervention.   Recent TSH normal.    Interventions/ Plan   EKG  IV Lopressor, PRN if HR > 120 Labs-CMP, magnesium, phosphorus Continue cardiac monitoring        Anthoney Harada, DNP, ACNPC- AG [facility] [place]

## 2023-08-09 NOTE — Anesthesia Procedure Notes (Signed)
Central Venous Catheter Insertion Performed by: Leonides Grills, MD, anesthesiologist Start/End1/16/2025 2:00 PM, 08/09/2023 2:15 PM Patient location: OR. Preanesthetic checklist: patient identified, IV checked, site marked, risks and benefits discussed, surgical consent, monitors and equipment checked, pre-op evaluation, timeout performed and anesthesia consent Position: Trendelenburg Hand hygiene performed  and maximum sterile barriers used  Catheter size: 8 Fr Total catheter length 16. Central line was placed.Double lumen Procedure performed using ultrasound guided technique. Ultrasound Notes:anatomy identified, needle tip was noted to be adjacent to the nerve/plexus identified, no ultrasound evidence of intravascular and/or intraneural injection and image(s) printed for medical record Attempts: 1 Following insertion, dressing applied, line sutured and Biopatch. Post procedure assessment: blood return through all ports and free fluid flow  Patient tolerated the procedure well with no immediate complications.

## 2023-08-09 NOTE — Progress Notes (Addendum)
   Subjective/Chief Complaint: Afib overnight, no flatus   Objective: Vital signs in last 24 hours: Temp:  [97.5 F (36.4 C)-98.4 F (36.9 C)] 97.5 F (36.4 C) (01/16 0426) Pulse Rate:  [87-204] 87 (01/16 0426) Resp:  [16-20] 16 (01/16 0426) BP: (137-173)/(72-85) 138/80 (01/16 0426) SpO2:  [97 %-99 %] 98 % (01/16 0426) Last BM Date : 08/04/23  Intake/Output from previous day: 01/15 0701 - 01/16 0700 In: 1336 [I.V.:1336] Out: -  Intake/Output this shift: No intake/output data recorded.  Ab distended nontender  Lab Results:  Recent Labs    08/08/23 0539 08/09/23 0514  WBC 5.0 8.3  HGB 13.8 14.4  HCT 42.7 45.5  PLT 219 253   BMET Recent Labs    08/07/23 0517 08/08/23 0539  NA 137 143  K 3.9 3.8  CL 104 108  CO2 26 26  GLUCOSE 111* 115*  BUN 24* 29*  CREATININE 0.66 0.78  CALCIUM 8.0* 7.9*   PT/INR No results for input(s): "LABPROT", "INR" in the last 72 hours. ABG No results for input(s): "PHART", "HCO3" in the last 72 hours.  Invalid input(s): "PCO2", "PO2"  Studies/Results: DG Abd Portable 1V Result Date: 08/08/2023 CLINICAL DATA:  [id number] SBO (small bowel obstruction) (HCC) [id number] EXAM: PORTABLE ABDOMEN - 1 VIEW COMPARISON:  08/07/2023 FINDINGS: Enteric tube remains positioned in the proximal stomach, side port at the level of the GE junction. Increasing caliber of dilated small bowel loops now measuring up to 6.2 cm in diameter. Dilute contrast is seen within dilated small bowel. No gross free intraperitoneal air on portable supine view. IMPRESSION: 1. Increasing caliber of dilated small bowel loops, compatible with progression of small bowel obstruction. 2. Enteric tube remains positioned in the proximal stomach, side port at the level of the GE junction. Consider advancement. Electronically Signed   By: Duanne Guess D.O.   On: 08/08/2023 09:41    Anti-infectives: Anti-infectives (From admission, onward)    Start     Dose/Rate Route Frequency  Ordered Stop   08/09/23 0700  cefTRIAXone (ROCEPHIN) 2 g in sodium chloride 0.9 % 100 mL IVPB        2 g 200 mL/hr over 30 Minutes Intravenous  Once 08/09/23 0102         Assessment/Plan: SBO  - no better, needs elap today with loa and possible sbr -discussed plan with patient and will proceed    FEN: NPO, IVF per TRH, NGT to LIWS  VTE: lovenox ID: no current abx   - per TRH -  Hx of CVA RLS Fibromyalgia HTN Hx of Hep C Graves disease I reviewed hospitalist notes, last 24 h vitals and pain scores, last 48 h intake and output, last 24 h labs and trends, and last 24 h imaging results.      Brooke Sanders 08/09/2023

## 2023-08-09 NOTE — Progress Notes (Signed)
TRH ROUNDING   NOTE Brooke Sanders ZOX:[id number]  DOB: 05/09/1946  DOA: 08/06/2023  PCP: [place], Betty G, MD  08/09/2023,8:11 AM   LOS: 3 days      Code Status: Full From: Home  current Dispo: Unclear   78 year old white female Sarcoidosis diagnosed 1980+ positive cervical node biopsy--history of vertebral fracture-chondrocalcinosis-follows with [facility] Known history of allergies, chronic cough and rhinitis follows with ENT Hepatitis C Prior stroke 2001 Graves' disease RLS Previous cholecystectomy hysterectomy?  Ectopic ovary--- prior SBO 2010 [?]  Events 1/13 came to emergency room nausea vomiting poor p.o. intake abdominal distention-CT scan SBO?-sodium 137 potassium 4.1 BUN/creatinine 26/0.7 LFTs normal White count normal  General Surgery consulted, NG tube placed-gastrograffin attempted 1/15 runs of SVT  procedures 1/13 CT scan?  Multiple adjacent transitions?  Closed-loop obstruction possible internal hernia-cholecystotomy with biliary duct dilatation-small volume ascites and mesenteric edema    Plan  Small bowel obstruction?  Internal hernia Gastrografin reattempted 1/15-despite conservative management going for surgery this pm keep NG tube in place-continue morphine 2-4 for pain every 2 as needed-continue Protonix 40 every 24 May require TPN? Paroxysmal SVT Develoepd runs of SVT 1/13 pm/early am 1/14---strips personally reviewed---would only monitor--wouldn't change management or add Ac for this indication unless we see perssitent pater,  Add back metoprolol as a scheduled IV med---check electrolytes again in am AKI on admission Relatively stable --IVF rate increased to 125 cc/h Sarcoidosis clinically quiescent Monitor trends-on no specific modifying therapy  Hepatitis C history Prior stroke 2001 Holding at this time Pravachol 10 ASA 81 HTN HCTZ 12.5 held-monitor trends on as needed hydralazine IV for blood pressure >160/100 Graves'  disease Supposed to be on Synthroid 88 every morning-May need to start IV replacement if prolonged n.p.o. state RLS  DVT prophylaxis: Lovenox  Status is: Inpatient Remains inpatient appropriate because:   Requires possible surgery   Subjective:  Overnight events noted EKG shows paroxysmal SVT which self terminated--hasn't been on usual meds No flatus, no cp NG still in draining large amounts  Objective + exam Vitals:   08/09/23 0014 08/09/23 0204 08/09/23 0230 08/09/23 0426  BP: (!) 160/72 (!) 150/76 137/80 138/80  Pulse: (!) 118 (!) 204 (!) 122 87  Resp: 19   16  Temp: 97.7 F (36.5 C)   (!) 97.5 F (36.4 C)  TempSrc: Oral   Oral  SpO2: 99% 97% 98% 98%   There were no vitals filed for this visit.  Examination:  Eomi ncat no focal deficit Cta b no added sound Abd distended, tympanitic No le edema S1 s2 no m  Data Reviewed: reviewed   CBC    Component Value Date/Time   WBC 8.3 08/09/2023 0514   RBC 4.71 08/09/2023 0514   HGB 14.4 08/09/2023 0514   HCT 45.5 08/09/2023 0514   PLT 253 08/09/2023 0514   MCV 96.6 08/09/2023 0514   MCH 30.6 08/09/2023 0514   MCHC 31.6 08/09/2023 0514   RDW 13.0 08/09/2023 0514   LYMPHSABS 1.0 08/09/2023 0514   MONOABS 1.8 (H) 08/09/2023 0514   EOSABS 0.0 08/09/2023 0514   BASOSABS 0.0 08/09/2023 0514      Latest Ref Rng & Units 08/09/2023    5:15 AM 08/08/2023    5:39 AM 08/07/2023    5:17 AM  CMP  Glucose 70 - 99 mg/dL 811  914  782   BUN 8 - 23 mg/dL 34  29  24   Creatinine 0.44 - 1.00 mg/dL 9.56  2.13  0.66   Sodium 135 - 145 mmol/L 145  143  137   Potassium 3.5 - 5.1 mmol/L 3.8  3.8  3.9   Chloride 98 - 111 mmol/L 111  108  104   CO2 22 - 32 mmol/L 22  26  26    Calcium 8.9 - 10.3 mg/dL 7.8  7.9  8.0   Total Protein 6.5 - 8.1 g/dL 5.9     Total Bilirubin 0.0 - 1.2 mg/dL 0.6     Alkaline Phos 38 - 126 U/L 34     AST 15 - 41 U/L 12     ALT 0 - 44 U/L 11       Scheduled Meds:  enoxaparin (LOVENOX) injection  40  mg Subcutaneous Q24H   pantoprazole (PROTONIX) IV  40 mg Intravenous Q24H   Continuous Infusions:  sodium chloride 100 mL/hr at 08/09/23 0805   cefTRIAXone (ROCEPHIN)  IV     potassium PHOSPHATE IVPB (in mmol) 15 mmol (08/09/23 0806)    Time \\27   Rhetta Mura, MD  [facility]

## 2023-08-09 NOTE — Progress Notes (Signed)
   08/09/23 1433  Vent Select  $ Ventilator Initial/Subsequent  Initial  Invasive or Noninvasive Invasive  Adult Vent Y  Vent start date 08/09/23  Vent start time 1433  Airway 7 mm  Placement Date/Time: 08/09/23 (c) 1229   Grade View: Grade 1  Airway Device: Endotracheal Tube  Laryngoscope Blade: MAC;3  ETT Types: Oral  Size (mm): 7 mm  Cuffed: Min.occ.pres.  Insertion attempts: 1  Airway Equipment: Stylet  Placement Confirmation...  Secured at (cm) (S)  22 cm  Measured From (S)  Lips  Secured Location (S)  Center  Secured By English as a second language teacher No  Tube Holder Repositioned Yes  Prone position No  Head position Right  Cuff Pressure (cm H2O) Green OR 18-26 CmH2O  Site Condition Dry  Adult Ventilator Settings  Vent Type Servo i  Humidity HME  Vent Mode (S)  PRVC  Vt Set (S)  360 mL  Set Rate (S)  18 bmp  FiO2 (%) (S)  50 %  I Time 1 Sec(s)  PEEP (S)  5 cmH20  Adult Ventilator Measurements  Peak Airway Pressure 15 L/min  Mean Airway Pressure 8 cmH20  Plateau Pressure 13 cmH20  Resp Rate Spontaneous 0 br/min  Resp Rate Total 18 br/min  Exhaled Vt 360 mL  Measured Ve 6.4 L  I:E Ratio Measured 1:2.3  Total PEEP 5 cmH20  SpO2 100 %  Adult Ventilator Alarms  Alarms On Y  Ve High Alarm 24 L/min  Ve Low Alarm 5 L/min  Resp Rate High Alarm 35 br/min  Resp Rate Low Alarm 8  PEEP Low Alarm 2 cmH2O  Press High Alarm 40 cmH2O  T Apnea 20 sec(s)  VAP Prevention  HOB> 30 Degrees Y  Breath Sounds  Bilateral Breath Sounds Diminished;Clear  Vent Respiratory Assessment  Level of Consciousness Alert  Respiratory Pattern Regular;Unlabored  Suction Method  Respiratory Interventions  (Hold suctioning at this time due to RNs assessing pt.)

## 2023-08-09 NOTE — Progress Notes (Signed)
Pt has had 360 mL output into wound vac so far. Canister changed.

## 2023-08-10 ENCOUNTER — Other Ambulatory Visit: Payer: Self-pay

## 2023-08-10 ENCOUNTER — Inpatient Hospital Stay (HOSPITAL_COMMUNITY): Payer: Medicare Other | Admitting: Certified Registered Nurse Anesthetist

## 2023-08-10 ENCOUNTER — Encounter (HOSPITAL_COMMUNITY): Payer: Self-pay | Admitting: General Surgery

## 2023-08-10 ENCOUNTER — Inpatient Hospital Stay (HOSPITAL_COMMUNITY): Payer: Medicare Other

## 2023-08-10 ENCOUNTER — Encounter (HOSPITAL_COMMUNITY): Admission: EM | Disposition: E | Payer: Self-pay | Source: Ambulatory Visit | Attending: Pulmonary Disease

## 2023-08-10 DIAGNOSIS — E039 Hypothyroidism, unspecified: Secondary | ICD-10-CM

## 2023-08-10 DIAGNOSIS — K56609 Unspecified intestinal obstruction, unspecified as to partial versus complete obstruction: Secondary | ICD-10-CM

## 2023-08-10 DIAGNOSIS — K631 Perforation of intestine (nontraumatic): Secondary | ICD-10-CM | POA: Diagnosis not present

## 2023-08-10 DIAGNOSIS — R571 Hypovolemic shock: Secondary | ICD-10-CM

## 2023-08-10 DIAGNOSIS — I2510 Atherosclerotic heart disease of native coronary artery without angina pectoris: Secondary | ICD-10-CM

## 2023-08-10 DIAGNOSIS — J9601 Acute respiratory failure with hypoxia: Secondary | ICD-10-CM

## 2023-08-10 HISTORY — PX: LAPAROTOMY: SHX154

## 2023-08-10 LAB — COMPREHENSIVE METABOLIC PANEL
ALT: 11 U/L (ref 0–44)
AST: 19 U/L (ref 15–41)
Albumin: 2.6 g/dL — ABNORMAL LOW (ref 3.5–5.0)
Alkaline Phosphatase: 25 U/L — ABNORMAL LOW (ref 38–126)
Anion gap: 8 (ref 5–15)
BUN: 41 mg/dL — ABNORMAL HIGH (ref 8–23)
CO2: 22 mmol/L (ref 22–32)
Calcium: 7 mg/dL — ABNORMAL LOW (ref 8.9–10.3)
Chloride: 114 mmol/L — ABNORMAL HIGH (ref 98–111)
Creatinine, Ser: 2.27 mg/dL — ABNORMAL HIGH (ref 0.44–1.00)
GFR, Estimated: 21 mL/min — ABNORMAL LOW (ref >60)
Glucose, Bld: 117 mg/dL — ABNORMAL HIGH (ref 70–99)
Potassium: 5 mmol/L (ref 3.5–5.1)
Sodium: 144 mmol/L (ref 135–145)
Total Bilirubin: 0.5 mg/dL (ref 0.0–1.2)
Total Protein: 4.7 g/dL — ABNORMAL LOW (ref 6.5–8.1)

## 2023-08-10 LAB — BLOOD GAS, ARTERIAL
Acid-base deficit: 3.3 mmol/L — ABNORMAL HIGH (ref 0.0–2.0)
Acid-base deficit: 6.1 mmol/L — ABNORMAL HIGH (ref 0.0–2.0)
Acid-base deficit: 10.6 mmol/L — ABNORMAL HIGH (ref 0.0–2.0)
Bicarbonate: 22.1 mmol/L (ref 20.0–28.0)
Bicarbonate: 20.2 mmol/L (ref 20.0–28.0)
Bicarbonate: 15.2 mmol/L — ABNORMAL LOW (ref 20.0–28.0)
Drawn by: 20012
FIO2: 35 %
FIO2: 28 %
FIO2: 100 %
MECHVT: 360 mL
MECHVT: 360 mL
MECHVT: 360 mL
O2 Content: 35 L/min
O2 Content: 96 L/min
O2 Saturation: 98.2 %
O2 Saturation: 99.5 %
O2 Saturation: 100 %
PEEP: 5 cmH2O
PEEP: 5 cmH2O
PEEP: 5 cmH2O
Patient temperature: 37.1
Patient temperature: 37
Patient temperature: 36
RATE: 20 {breaths}/min
RATE: 20 {breaths}/min
RATE: 25 {breaths}/min
pCO2 arterial: 40 mm[Hg] (ref 32–48)
pCO2 arterial: 42 mm[Hg] (ref 32–48)
pCO2 arterial: 32 mm[Hg] (ref 32–48)
pH, Arterial: 7.35 (ref 7.35–7.45)
pH, Arterial: 7.29 — ABNORMAL LOW (ref 7.35–7.45)
pH, Arterial: 7.28 — ABNORMAL LOW (ref 7.35–7.45)
pO2, Arterial: 115 mm[Hg] — ABNORMAL HIGH (ref 83–108)
pO2, Arterial: 122 mm[Hg] — ABNORMAL HIGH (ref 83–108)
pO2, Arterial: 381 mm[Hg] — ABNORMAL HIGH (ref 83–108)

## 2023-08-10 LAB — CBC WITH DIFFERENTIAL/PLATELET
Abs Immature Granulocytes: 1.61 10*3/uL — ABNORMAL HIGH (ref 0.00–0.07)
Basophils Absolute: 0 10*3/uL (ref 0.0–0.1)
Basophils Relative: 0 %
Eosinophils Absolute: 0 10*3/uL (ref 0.0–0.5)
Eosinophils Relative: 0 %
HCT: 39.5 % (ref 36.0–46.0)
Hemoglobin: 12.4 g/dL (ref 12.0–15.0)
Immature Granulocytes: 5 %
Lymphocytes Relative: 3 %
Lymphs Abs: 1 10*3/uL (ref 0.7–4.0)
MCH: 30.8 pg (ref 26.0–34.0)
MCHC: 31.4 g/dL (ref 30.0–36.0)
MCV: 98.3 fL (ref 80.0–100.0)
Monocytes Absolute: 1.4 10*3/uL — ABNORMAL HIGH (ref 0.1–1.0)
Monocytes Relative: 5 %
Neutro Abs: 26.9 10*3/uL — ABNORMAL HIGH (ref 1.7–7.7)
Neutrophils Relative %: 87 %
Platelets: 148 10*3/uL — ABNORMAL LOW (ref 150–400)
RBC: 4.02 MIL/uL (ref 3.87–5.11)
RDW: 13.2 % (ref 11.5–15.5)
WBC Morphology: INCREASED
WBC: 30.9 10*3/uL — ABNORMAL HIGH (ref 4.0–10.5)
nRBC: 0 % (ref 0.0–0.2)

## 2023-08-10 LAB — CBC
HCT: 31.3 % — ABNORMAL LOW (ref 36.0–46.0)
HCT: 33.8 % — ABNORMAL LOW (ref 36.0–46.0)
Hemoglobin: 9.8 g/dL — ABNORMAL LOW (ref 12.0–15.0)
Hemoglobin: 10.8 g/dL — ABNORMAL LOW (ref 12.0–15.0)
MCH: 31.1 pg (ref 26.0–34.0)
MCH: 30.9 pg (ref 26.0–34.0)
MCHC: 31.3 g/dL (ref 30.0–36.0)
MCHC: 32 g/dL (ref 30.0–36.0)
MCV: 99.4 fL (ref 80.0–100.0)
MCV: 96.6 fL (ref 80.0–100.0)
Platelets: 156 10*3/uL (ref 150–400)
Platelets: 110 10*3/uL — ABNORMAL LOW (ref 150–400)
RBC: 3.15 MIL/uL — ABNORMAL LOW (ref 3.87–5.11)
RBC: 3.5 MIL/uL — ABNORMAL LOW (ref 3.87–5.11)
RDW: 13.3 % (ref 11.5–15.5)
RDW: 15.9 % — ABNORMAL HIGH (ref 11.5–15.5)
WBC: 30 10*3/uL — ABNORMAL HIGH (ref 4.0–10.5)
WBC: 36.2 10*3/uL — ABNORMAL HIGH (ref 4.0–10.5)
nRBC: 0 % (ref 0.0–0.2)
nRBC: 0.1 % (ref 0.0–0.2)

## 2023-08-10 LAB — BASIC METABOLIC PANEL
Anion gap: 18 — ABNORMAL HIGH (ref 5–15)
BUN: 41 mg/dL — ABNORMAL HIGH (ref 8–23)
CO2: 10 mmol/L — ABNORMAL LOW (ref 22–32)
Calcium: 6.1 mg/dL — CL (ref 8.9–10.3)
Chloride: 108 mmol/L (ref 98–111)
Creatinine, Ser: 2.67 mg/dL — ABNORMAL HIGH (ref 0.44–1.00)
GFR, Estimated: 18 mL/min — ABNORMAL LOW (ref >60)
Glucose, Bld: 174 mg/dL — ABNORMAL HIGH (ref 70–99)
Potassium: 6 mmol/L — ABNORMAL HIGH (ref 3.5–5.1)
Sodium: 136 mmol/L (ref 135–145)

## 2023-08-10 LAB — PROTIME-INR
INR: 2 — ABNORMAL HIGH (ref 0.8–1.2)
INR: 2.1 — ABNORMAL HIGH (ref 0.8–1.2)
Prothrombin Time: 22.9 s — ABNORMAL HIGH (ref 11.4–15.2)
Prothrombin Time: 24.1 s — ABNORMAL HIGH (ref 11.4–15.2)

## 2023-08-10 LAB — GLUCOSE, CAPILLARY
Glucose-Capillary: 192 mg/dL — ABNORMAL HIGH (ref 70–99)
Glucose-Capillary: 79 mg/dL (ref 70–99)

## 2023-08-10 LAB — LACTIC ACID, PLASMA
Lactic Acid, Venous: 8.4 mmol/L (ref 0.5–1.9)
Lactic Acid, Venous: 5.6 mmol/L (ref 0.5–1.9)

## 2023-08-10 LAB — PHOSPHORUS: Phosphorus: 2.4 mg/dL — ABNORMAL LOW (ref 2.5–4.6)

## 2023-08-10 LAB — TROPONIN I (HIGH SENSITIVITY)
Troponin I (High Sensitivity): 39 ng/L — ABNORMAL HIGH (ref <18)
Troponin I (High Sensitivity): 34 ng/L — ABNORMAL HIGH (ref <18)

## 2023-08-10 LAB — MAGNESIUM: Magnesium: 1.9 mg/dL (ref 1.7–2.4)

## 2023-08-10 LAB — TRIGLYCERIDES: Triglycerides: 152 mg/dL — ABNORMAL HIGH (ref <150)

## 2023-08-10 LAB — PREPARE RBC (CROSSMATCH)

## 2023-08-10 SURGERY — LAPAROTOMY, EXPLORATORY
Anesthesia: General

## 2023-08-10 MED ORDER — 0.9 % SODIUM CHLORIDE (POUR BTL) OPTIME
TOPICAL | Status: DC | PRN
  Administered 2023-08-10 (×3): 1000 mL

## 2023-08-10 MED ORDER — THIAMINE HCL 100 MG/ML IJ SOLN
100.0000 mg | Freq: Every day | INTRAMUSCULAR | Status: AC
  Administered 2023-08-10 – 2023-08-14 (×5): 100 mg via INTRAVENOUS
  Filled 2023-08-10 (×5): qty 2

## 2023-08-10 MED ORDER — FENTANYL CITRATE (PF) 100 MCG/2ML IJ SOLN
INTRAMUSCULAR | Status: AC
  Filled 2023-08-10: qty 2

## 2023-08-10 MED ORDER — ALBUMIN HUMAN 5 % IV SOLN: INTRAVENOUS | Status: DC | PRN

## 2023-08-10 MED ORDER — ACETAMINOPHEN 10 MG/ML IV SOLN
INTRAVENOUS | Status: DC | PRN
  Administered 2023-08-10: 1000 mg via INTRAVENOUS

## 2023-08-10 MED ORDER — ACETAMINOPHEN 10 MG/ML IV SOLN
1000.0000 mg | Freq: Four times a day (QID) | INTRAVENOUS | Status: DC
  Administered 2023-08-10: 1000 mg via INTRAVENOUS
  Filled 2023-08-10: qty 100

## 2023-08-10 MED ORDER — SODIUM CHLORIDE 0.9 % IV SOLN: INTRAVENOUS | Status: AC | PRN

## 2023-08-10 MED ORDER — LACTATED RINGERS IV BOLUS
1000.0000 mL | Freq: Once | INTRAVENOUS | Status: AC
  Administered 2023-08-10: 1000 mL via INTRAVENOUS

## 2023-08-10 MED ORDER — LACTATED RINGERS IV SOLN: INTRAVENOUS | Status: AC

## 2023-08-10 MED ORDER — DEXTROSE IN LACTATED RINGERS 5 % IV SOLN: INTRAVENOUS | Status: DC

## 2023-08-10 MED ORDER — SODIUM BICARBONATE 8.4 % IV SOLN
75.0000 meq | Freq: Once | INTRAVENOUS | Status: AC
  Administered 2023-08-10: 75 meq via INTRAVENOUS
  Filled 2023-08-10: qty 50

## 2023-08-10 MED ORDER — ACETAMINOPHEN 10 MG/ML IV SOLN
INTRAVENOUS | Status: AC
  Filled 2023-08-10: qty 100

## 2023-08-10 MED ORDER — SODIUM CHLORIDE 0.9% IV SOLUTION: Freq: Once | INTRAVENOUS | Status: AC

## 2023-08-10 MED ORDER — CALCIUM GLUCONATE-NACL 1-0.675 GM/50ML-% IV SOLN
1.0000 g | Freq: Once | INTRAVENOUS | Status: AC
  Administered 2023-08-10: 1000 mg via INTRAVENOUS
  Filled 2023-08-10: qty 50

## 2023-08-10 MED ORDER — SODIUM CHLORIDE 0.9% IV SOLUTION: Freq: Once | INTRAVENOUS | Status: DC

## 2023-08-10 MED ORDER — FENTANYL CITRATE (PF) 100 MCG/2ML IJ SOLN
INTRAMUSCULAR | Status: DC | PRN
  Administered 2023-08-10 (×2): 50 ug via INTRAVENOUS

## 2023-08-10 MED ORDER — NOREPINEPHRINE 4 MG/250ML-% IV SOLN
0.0000 ug/min | INTRAVENOUS | Status: DC
  Administered 2023-08-10: 20 ug/min via INTRAVENOUS
  Administered 2023-08-10: 2 ug/min via INTRAVENOUS
  Administered 2023-08-11: 15 ug/min via INTRAVENOUS
  Administered 2023-08-11: 11 ug/min via INTRAVENOUS
  Filled 2023-08-10 (×4): qty 250

## 2023-08-10 MED ORDER — SODIUM BICARBONATE 8.4 % IV SOLN
INTRAVENOUS | Status: DC
  Filled 2023-08-10: qty 150

## 2023-08-10 MED ORDER — MIDAZOLAM-SODIUM CHLORIDE 100-0.9 MG/100ML-% IV SOLN
2.0000 mg/h | INTRAVENOUS | Status: DC
  Administered 2023-08-10: 2 mg/h via INTRAVENOUS
  Administered 2023-08-13: 0.5 mg/h via INTRAVENOUS
  Filled 2023-08-10 (×2): qty 100

## 2023-08-10 MED ORDER — ROCURONIUM BROMIDE 100 MG/10ML IV SOLN
INTRAVENOUS | Status: DC | PRN
  Administered 2023-08-10: 30 mg via INTRAVENOUS
  Administered 2023-08-10: 20 mg via INTRAVENOUS

## 2023-08-10 MED ORDER — ENOXAPARIN SODIUM 30 MG/0.3ML IJ SOSY: 30.0000 mg | PREFILLED_SYRINGE | INTRAMUSCULAR | Status: DC

## 2023-08-10 MED ORDER — INSULIN ASPART 100 UNIT/ML IJ SOLN: 0.0000 [IU] | Freq: Three times a day (TID) | INTRAMUSCULAR | Status: DC

## 2023-08-10 MED ORDER — VASOPRESSIN 20 UNITS/100 ML INFUSION FOR SHOCK
0.0000 [IU]/min | INTRAVENOUS | Status: DC
  Administered 2023-08-10: 0.04 [IU]/min via INTRAVENOUS
  Administered 2023-08-10: 0.03 [IU]/min via INTRAVENOUS
  Administered 2023-08-11 (×2): 0.04 [IU]/min via INTRAVENOUS
  Filled 2023-08-10 (×4): qty 100

## 2023-08-10 MED ORDER — POLYVINYL ALCOHOL 1.4 % OP SOLN
1.0000 [drp] | OPHTHALMIC | Status: DC
  Administered 2023-08-10 – 2023-08-15 (×25): 1 [drp] via OPHTHALMIC
  Filled 2023-08-10: qty 15

## 2023-08-10 MED ORDER — LACTATED RINGERS IV SOLN: INTRAVENOUS | Status: DC | PRN

## 2023-08-10 MED ORDER — INSULIN ASPART 100 UNIT/ML IJ SOLN
0.0000 [IU] | INTRAMUSCULAR | Status: DC
Start: 2023-08-11 — End: 2023-08-10

## 2023-08-10 MED ORDER — ARTIFICIAL TEARS OPHTHALMIC OINT
TOPICAL_OINTMENT | OPHTHALMIC | Status: DC | PRN
  Administered 2023-08-10 – 2023-08-14 (×6): 1 via OPHTHALMIC
  Filled 2023-08-10: qty 3.5

## 2023-08-10 MED ORDER — LACTATED RINGERS IV SOLN: INTRAVENOUS | Status: DC

## 2023-08-10 MED ORDER — PIPERACILLIN-TAZOBACTAM IN DEX 2-0.25 GM/50ML IV SOLN
2.2500 g | Freq: Four times a day (QID) | INTRAVENOUS | Status: DC
Start: 2023-08-11 — End: 2023-08-15
  Administered 2023-08-11 – 2023-08-15 (×18): 2.25 g via INTRAVENOUS
  Filled 2023-08-10 (×18): qty 50

## 2023-08-10 MED ORDER — LACTATED RINGERS IV BOLUS
1000.0000 mL | INTRAVENOUS | Status: AC
  Administered 2023-08-10: 1000 mL via INTRAVENOUS

## 2023-08-10 MED ORDER — INSULIN ASPART 100 UNIT/ML IJ SOLN
0.0000 [IU] | INTRAMUSCULAR | Status: DC
Start: 2023-08-11 — End: 2023-08-14
  Administered 2023-08-10: 1 [IU] via SUBCUTANEOUS
  Administered 2023-08-11: 2 [IU] via SUBCUTANEOUS
  Administered 2023-08-12 – 2023-08-13 (×4): 1 [IU] via SUBCUTANEOUS

## 2023-08-10 MED ORDER — TRACE MINERALS CU-MN-SE-ZN 300-55-60-3000 MCG/ML IV SOLN
INTRAVENOUS | Status: DC
  Filled 2023-08-10: qty 960

## 2023-08-10 MED ORDER — ACETAMINOPHEN 10 MG/ML IV SOLN
1000.0000 mg | Freq: Four times a day (QID) | INTRAVENOUS | Status: AC
  Administered 2023-08-11 (×3): 1000 mg via INTRAVENOUS
  Filled 2023-08-10 (×3): qty 100

## 2023-08-10 MED ORDER — ONDANSETRON HCL 4 MG/2ML IJ SOLN
INTRAMUSCULAR | Status: DC | PRN
  Administered 2023-08-10: 4 mg via INTRAVENOUS

## 2023-08-10 SURGICAL SUPPLY — 41 items
BAG COUNTER SPONGE SURGICOUNT (BAG) IMPLANT
BARRIER SKIN 2 1/4 (WOUND CARE) ×1 IMPLANT
BARRIER SKIN OD1.75 2 1/4 FLNG (WOUND CARE) IMPLANT
BLADE EXTENDED COATED 6.5IN (ELECTRODE) IMPLANT
CANISTER WOUNDNEG PRESSURE 500 (CANNISTER) IMPLANT
CHLORAPREP W/TINT 26 (MISCELLANEOUS) ×1 IMPLANT
COVER MAYO STAND STRL (DRAPES) ×3 IMPLANT
COVER SURGICAL LIGHT HANDLE (MISCELLANEOUS) ×1 IMPLANT
DRAPE LAPAROSCOPIC ABDOMINAL (DRAPES) ×1 IMPLANT
DRSG OPSITE POSTOP 4X10 (GAUZE/BANDAGES/DRESSINGS) IMPLANT
DRSG OPSITE POSTOP 4X6 (GAUZE/BANDAGES/DRESSINGS) IMPLANT
DRSG OPSITE POSTOP 4X8 (GAUZE/BANDAGES/DRESSINGS) IMPLANT
ELECT REM PT RETURN 15FT ADLT (MISCELLANEOUS) ×1 IMPLANT
GAUZE SPONGE 4X4 12PLY STRL (GAUZE/BANDAGES/DRESSINGS) IMPLANT
GLOVE BIO SURGEON STRL SZ7 (GLOVE) ×2 IMPLANT
GLOVE BIOGEL PI IND STRL 7.5 (GLOVE) ×2 IMPLANT
GOWN STRL REUS W/ TWL LRG LVL3 (GOWN DISPOSABLE) ×2 IMPLANT
HANDLE SUCTION POOLE (INSTRUMENTS) IMPLANT
KIT TURNOVER KIT A (KITS) IMPLANT
LIGASURE IMPACT 36 18CM CVD LR (INSTRUMENTS) IMPLANT
PACK COLON (CUSTOM PROCEDURE TRAY) ×1 IMPLANT
PENCIL SMOKE EVACUATOR (MISCELLANEOUS) IMPLANT
POUCH OSTOMY 2 PC DRNBL 2.25 (WOUND CARE) IMPLANT
RELOAD PROXIMATE 75MM BLUE (ENDOMECHANICALS) ×2 IMPLANT
RELOAD STAPLE 75 3.8 BLU REG (ENDOMECHANICALS) IMPLANT
SPONGE ABD ABTHERA ADVANCE (MISCELLANEOUS) IMPLANT
STAPLER GUN LINEAR PROX 60 (STAPLE) IMPLANT
STAPLER PROXIMATE 75MM BLUE (STAPLE) IMPLANT
STAPLER SKIN PROX WIDE 3.9 (STAPLE) ×1 IMPLANT
SUCTION POOLE HANDLE (INSTRUMENTS) IMPLANT
SUT NOVA NAB GS-21 0 18 T12 DT (SUTURE) IMPLANT
SUT NOVA NAB GS-21 1 T12 (SUTURE) IMPLANT
SUT PDS AB 1 TP1 96 (SUTURE) IMPLANT
SUT SILK 2 0 SH CR/8 (SUTURE) ×1 IMPLANT
SUT SILK 2-0 18XBRD TIE 12 (SUTURE) ×1 IMPLANT
SUT SILK 3 0 SH CR/8 (SUTURE) ×1 IMPLANT
SUT SILK 3-0 18XBRD TIE 12 (SUTURE) ×1 IMPLANT
SUT VIC AB 3-0 SH 18 (SUTURE) IMPLANT
TOWEL OR 17X26 10 PK STRL BLUE (TOWEL DISPOSABLE) IMPLANT
TRAY FOLEY MTR SLVR 14FR STAT (SET/KITS/TRAYS/PACK) IMPLANT
TRAY FOLEY MTR SLVR 16FR STAT (SET/KITS/TRAYS/PACK) IMPLANT

## 2023-08-10 NOTE — Progress Notes (Signed)
Progress Note  1 Day Post-Op  Subjective: Pt critically ill - intubated and sedated and now on levo. Family at bedside this AM - discussed return to OR today for second look and possible need for further resection, possible ostomy, possible closure. They are on board with proceeding.  UOP low. VAC output SS, NGT output bilious as expected   Objective: Vital signs in last 24 hours: Temp:  [95.9 F (35.5 C)-100.5 F (38.1 C)] 100.5 F (38.1 C) (01/17 0600) Pulse Rate:  [90-129] 126 (01/17 0630) Resp:  [16-30] 27 (01/17 0630) BP: (79-167)/(38-86) 97/46 (01/17 0630) SpO2:  [96 %-100 %] 99 % (01/17 0630) FiO2 (%):  [35 %-50 %] 35 % (01/17 0600) Weight:  [54 kg-62 kg] 62 kg (01/17 0500) Last BM Date : 08/04/23  Intake/Output from previous day: 01/16 0701 - 01/17 0700 In: 5486.7 [I.V.:3096.4; IV Piggyback:2390.4] Out: 985 [Urine:100; Emesis/NG output:525; Drains:360] Intake/Output this shift: No intake/output data recorded.  PE: General: elderly female in critical condition  Heart: sinus tachycardia in the 120s Lungs: ventilator Abd: open abdominal VAC with SS output, good seal; NGT with bilious drainage GU: foley present with scant dark urine  MS: all 4 extremities are symmetrical with no cyanosis, clubbing, or edema.    Lab Results:  Recent Labs    08/09/23 1539 08/10/23 0409  WBC 4.3 30.9*  HGB 14.2 12.4  HCT 45.6 39.5  PLT 244 148*   BMET Recent Labs    08/09/23 1539 08/10/23 0409  NA 149* 144  K 3.4* 5.0  CL 117* 114*  CO2 21* 22  GLUCOSE 115* 117*  BUN 33* 41*  CREATININE 0.99 2.27*  CALCIUM 7.2* 7.0*   PT/INR No results for input(s): "LABPROT", "INR" in the last 72 hours. CMP     Component Value Date/Time   NA 144 08/10/2023 0409   K 5.0 08/10/2023 0409   CL 114 (H) 08/10/2023 0409   CO2 22 08/10/2023 0409   GLUCOSE 117 (H) 08/10/2023 0409   BUN 41 (H) 08/10/2023 0409   CREATININE 2.27 (H) 08/10/2023 0409   CREATININE 0.79 05/23/2023 1111    CALCIUM 7.0 (L) 08/10/2023 0409   PROT 4.7 (L) 08/10/2023 0409   ALBUMIN 2.6 (L) 08/10/2023 0409   AST 19 08/10/2023 0409   ALT 11 08/10/2023 0409   ALKPHOS 25 (L) 08/10/2023 0409   BILITOT 0.5 08/10/2023 0409   GFRNONAA 21 (L) 08/10/2023 0409   GFRNONAA 84 11/11/2020 1110   GFRAA 98 11/11/2020 1110   Lipase     Component Value Date/Time   LIPASE 15 08/06/2023 1054       Studies/Results: DG CHEST PORT 1 VIEW Result Date: 08/09/2023 CLINICAL DATA:  Intubated EXAM: PORTABLE CHEST 1 VIEW COMPARISON:  09/06/2022 FINDINGS: Endotracheal tube tip is about 4.2 cm superior to the carina. Esophageal tube tip within the proximal stomach. Subsegmental atelectasis in the right lower lung. Probable small left effusion with left basilar atelectasis. Right IJ central venous catheter tip at the cavoatrial region. Normal cardiac size with aortic atherosclerosis IMPRESSION: 1. Endotracheal tube tip about 4.2 cm superior to the carina. Esophageal tube tip within the proximal stomach. 2. Probable small left effusion with left basilar atelectasis. Subsegmental atelectasis in the right lower lung. Electronically Signed   By: Jasmine Pang M.D.   On: 08/09/2023 19:28    Anti-infectives: Anti-infectives (From admission, onward)    Start     Dose/Rate Route Frequency Ordered Stop   08/09/23 1545  piperacillin-tazobactam (ZOSYN) IVPB  3.375 g        3.375 g 12.5 mL/hr over 240 Minutes Intravenous Every 8 hours 08/09/23 1456 08/14/23 1359   08/09/23 0630  cefTRIAXone (ROCEPHIN) 2 g in sodium chloride 0.9 % 100 mL IVPB        2 g 200 mL/hr over 30 Minutes Intravenous On call 08/09/23 4782 08/09/23 1900        Assessment/Plan  SBO Septic shock  POD1 s/p ex-lap with small bowel resection x3 and right hemicolectomy, ABThera application Dr. Dwain Sarna - pt left open due to concern for ongoing ischemia  - on levo this AM - plan to take back to OR today for washout and second look, possible bowel  resection, possible ostomy, possible closure pending operative findings. Discussed with family at bedside  - prognosis is guarded and patient remains in critical condition, appreciate CCM management   FEN: NPO, TPN to start this evening  VTE: lovenox ID: Zosyn 1/16>>  AKI - Cr 2.2, UOP marginal  Post-op VDRF - vent per CCM Hx of CVA RLS Fibromyalgia HTN Hx of Hep C Graves disease    LOS: 4 days     Juliet Rude, [facility] 08/10/2023, 7:24 AM Please see Amion for pager number during day hours 7:00am-4:30pm

## 2023-08-10 NOTE — Consult Note (Addendum)
WOC Nurse ostomy consult note Consult requested for new colostomy and ABThera Vac to abd. Pt is in the perioperative setting after surgery today and the WOC nurse will perform the first post-op dressing change with the surgical team, if it is not planned to be performed in the OR, and ostomy teaching session on Mon as requested.  Thank-you,  Cammie Mcgee MSN, RN, CWOCN, Marissa, CNS [phone]

## 2023-08-10 NOTE — Procedures (Signed)
Arterial Line Insertion Start/End1/17/2025 10:35 AM  Patient location: ICU. Preanesthetic checklist: patient identified, IV checked, site marked, risks and benefits discussed, surgical consent, monitors and equipment checked, pre-op evaluation and timeout performed Right, radial was placed Catheter size: 20 G Hand hygiene performed  and maximum sterile barriers used  Allen's test indicative of satisfactory collateral circulation Attempts: 2 Procedure performed without using ultrasound guided technique. Following insertion, dressing applied and Biopatch. Post procedure assessment: normal  Patient tolerated the procedure well with no immediate complications.

## 2023-08-10 NOTE — Progress Notes (Signed)
PICC order received. Consent obtained from son at bedside. PICC insertion deferred to to drop in BP. PA and RN at bedside. They will notify IVTeam when pt is ready for PICC insertion.

## 2023-08-10 NOTE — Care Management (Signed)
Transition of Care [facility]) - Inpatient Brief Assessment   Patient Details  Name: Brooke Sanders MRN: [id number] Date of Birth: 07/09/1946  Transition of Care [facility]) CM/SW Contact:    Lavenia Atlas, RN Phone Number: 08/10/2023, 5:10 PM   Clinical Narrative: Per chart review patient currently intubated at [facility] ICU for SBO.  TOC will continue to follow for discharge needs.   Transition of Care Asessment: Insurance and Status: Insurance coverage has been reviewed Patient has primary care physician: Yes Home environment has been reviewed: Apartment Prior level of function:: independent Prior/Current Home Services: No current home services Social Drivers of Health Review: SDOH reviewed no interventions necessary Readmission risk has been reviewed: Yes Transition of care needs: no transition of care needs at this time

## 2023-08-10 NOTE — Progress Notes (Signed)
Patient transported to OR, report given to RN and CRNA. Vital signs stable.

## 2023-08-10 NOTE — Progress Notes (Addendum)
PCCM INTERVAL PROGRESS NOTE   Called to bedside in the in the post operative setting due to hypotension. Briefly this is a 78 year old female who was admitted with small bowel obstruction and failed conservative management. She was taken to the OR 1/16 and underwent multiple resections and lysis of adhesions. Taken back to the OR 1/17 and unfortunately needed more bowel removed. Now she is back in the ICU post operatively. Upon my arrival BP reading 62/31. Passive leg raise elicited a 5mm/hg SBP increase. IV fluid was started. Norepinephrine infusion started as well. Wound vac output was high. Surgery was contacted and was immediately present at bedside. 2 units of blood ordered as well as 2 units FFP.   With fluid, pressors, and blood products starting the BP did stabilize some to where pressors were being weaned. Hgb result 12 > 8 prior to blood admin.   Hypovolemic shock: - blood products as above - Levo, vaso titrated to MAP 65 - s/p 1 L LR bolus. - close monitoring of wound vac output - Repeat h&H at 2000  Acute respiratory failure with hypoxia: o2 sats low 90s on 100% and 5 PEEP. CXR essentially clear save some bibasilar atelectasis. Vent settings adjusted - repeat ABG - PAD protocol with fentanyl and propfol infusions for RASS goal -3 to -4 with open abdomen   Critical care time 45 minutes   Joneen Roach, AGACNP-BC [facility]  See Amion for personal pager PCCM on call pager [phone] until 7pm. Please call Elink 7p-7a. [phone]  08/10/2023 5:31 PM

## 2023-08-10 NOTE — Anesthesia Postprocedure Evaluation (Signed)
Anesthesia Post Note  Patient: Brooke Sanders  Procedure(s) Performed: EXPLORATORY LAPAROTOMY, LYSIS OF ADHESIONS, SMALL BOWEL RESECTION, COLON RESECTION     Patient location during evaluation: ICU Anesthesia Type: General Level of consciousness: sedated Pain management: pain level controlled Vital Signs Assessment: post-procedure vital signs reviewed and stable Respiratory status: patient remains intubated per anesthesia plan Cardiovascular status: stable Postop Assessment: no apparent nausea or vomiting Anesthetic complications: no   No notable events documented.  Last Vitals:  Vitals:   08/10/23 0755 08/10/23 0826  BP:    Pulse:    Resp:    Temp:  37.1 C  SpO2: 99%     Last Pain:  Vitals:   08/10/23 0826  TempSrc: Oral  PainSc:                  [name] Aryahna Spagna

## 2023-08-10 NOTE — Plan of Care (Signed)
  Problem: Clinical Measurements: Goal: Cardiovascular complication will be avoided Outcome: Progressing   Problem: Pain Management: Goal: General experience of comfort will improve Outcome: Progressing   Problem: Safety: Goal: Ability to remain free from injury will improve Outcome: Progressing   Problem: Skin Integrity: Goal: Risk for impaired skin integrity will decrease Outcome: Progressing

## 2023-08-10 NOTE — Progress Notes (Signed)
Dr. Derrell Lolling (on call for surgery) made aware of additional of bloody output from wound vac. Dr. Dwain Sarna was aware of . Continue current treatment plan, 1 unit PRBC given, 1 unit PRBC infusing, pending 2 FFP units once ready.

## 2023-08-10 NOTE — Progress Notes (Addendum)
PHARMACY - TOTAL PARENTERAL NUTRITION CONSULT NOTE   Indication: massive bowel resection  Patient Measurements: Height: 5' (152.4 cm) Weight: 62 kg (136 lb 11 oz) IBW/kg (Calculated) : 45.5 TPN AdjBW (KG): 49.3 Body mass index is 26.69 kg/m. Usual Weight: ~55 kg in November (weight has actually increased  Assessment:  23 yoF admitted with n/v, abdominal pain starting 1/10. Failed conservative management, and required exploratory laparotomy on 1/16. Surgery revealed ischemia and necrosis of the terminal ileum/cecum, requiring resection of this as well as the ascending colon. Bowel was left in discontinuity for now. Pharmacy consulted to start TPN on 1/17 d/t anticipated prolonged NPO status.  Glucose: No Hx DM - SBGs well controlled w/o SSI (goal 100-150); no hypoglycemia Electrolytes: K borderline elevated today d/t new AKI after being repleted yesterday; Ca, Phos both borderline low Renal: new AKI overnight; SCr previously stable WNL; BUN elevated and rising; UOP either very low (which would be c/w new AKI) or incompletely charted Hepatic: LFTs, Tbili WNL; albumin remains moderately low - TG borderline elevated (propofol started immediately postop) I/O: no mIVF; current ICU infusions equal about 25 ml/hr combined - propofol currently running at 11 ml/hr (provides 1.1 kcal/ml) GI Imaging: - 1/13 CT a/p: SBO with multiple transition points, possible ischemia; biliary duct dilatation - 1/13 and 1/15 AXRs show worsening SBO GI Surgeries / Procedures:  - 1/16 ex lap >> SBO x 3 of terminal ileum and R hemicolectomy; left open/discontinuous - 1/17 return to OR for washout, 2nd look - anticipate ileostomy placement at the time of closure  Central access: R internal jugular placed 1/16 in OR TPN start date: 1/17  Nutritional Goals: RD Assessment: pending   TPN as below provides 77 g protein and 630 kcal/day (~950 kcal with propofol at current rate); f/u RD recommendations to determine  appropriate product & rate  Current Nutrition:  NPO, propofol at 11.3 ml/hr provides ~300 kcal/daily  Due to the Baxter IV fluid disruption, all adult parenteral nutrition will utilize premade Clinimix products +/- fat emulsion infusion. Our goal will be to continue providing as close to 100% of our patient's nutritional needs, but due to limited TPN Clinimix concentrations we will attempt to meet at least 80% of protein and 75% of kcal needs.    Plan:  Start plain (no lytes) Clinimix 8/10 at 40 mL/hr at 1800 Given relatively high contribution of lipid calories if included, as well as borderline hypertriglyceridemia and kcal from concurrent propofol, will hold IV lipids today Add standard MVI and trace elements to TPN Chromium currently on hold d/t national shortage Per RD recommendations, will add thiamine 100 mg IV x 5 days for refeeding risk Initiate very sensitive scale SSI with q8 hr CBG checks and adjust as needed  MVIF per MD Monitor TPN labs on Mon/Thurs BMP, Phos, Mg x 3d  Bernadene Person, PharmD, BCPS [phone] 08/10/2023, 10:14 AM

## 2023-08-10 NOTE — Transfer of Care (Signed)
Immediate Anesthesia Transfer of Care Note  Patient: Brooke Sanders  Procedure(s) Performed: RE-EXPLORATION LAPAROTOMY, POSSIBLE SMALL BOWEL RESECTION AND ILEOSTOMY CREATION  Patient Location: PACU and ICU  Anesthesia Type:General  Level of Consciousness: sedated  Airway & Oxygen Therapy: Patient remains intubated per anesthesia plan  Post-op Assessment: Report given to RN and Post -op Vital signs reviewed and stable  Post vital signs: Reviewed and stable  Last Vitals:  Vitals Value Taken Time  BP    Temp    Pulse    Resp    SpO2      Last Pain:  Vitals:   08/10/23 0826  TempSrc: Oral  PainSc:          Complications: No notable events documented.

## 2023-08-10 NOTE — Progress Notes (Signed)
   08/10/23 1225  Vent Select  Invasive or Noninvasive (S)   (Placed vent on standby, bagged pt with 100% FI02 from ICU room 1230 to PACU, safe transport, ETT secure.)

## 2023-08-10 NOTE — Progress Notes (Signed)
Notified Dr. Dwain Sarna of bloody output from canister, since returning from OR. Verbal order received to check CBC. Shortly after, patient's BP dropped drastically and Joneen Roach, NP (critical care) called to bedside and assisted with management of hypotension. Surgery team arrived shortly after. Family updated at bedside.

## 2023-08-10 NOTE — Progress Notes (Signed)
Called by nurse about concern on patient's vac output. Pt with CBC returned at 10.8.  Lactate up but pt on pressors 2nd u of FFP currently going in.  Will repeat INR after FFP in. Con't to support, oozing should stop once INR down to normal range. Transfuse PRBC and give IVF if BP con't to remain low.

## 2023-08-10 NOTE — Progress Notes (Signed)
PHARMACY NOTE:  ANTIMICROBIAL RENAL DOSAGE ADJUSTMENT  Current antimicrobial regimen includes a mismatch between antimicrobial dosage and estimated renal function.  As per policy approved by the Pharmacy & Therapeutics and Medical Executive Committees, the antimicrobial dosage will be adjusted accordingly.  Current antimicrobial dosage:  Zosyn 3.375 mg IV q8hrs, infuse each dose over 4 hours  Indication: IAI  Renal Function:  Estimated Creatinine Clearance: 16.5 mL/min (A) (by C-G formula based on SCr of 2.27 mg/dL (H)).    Antimicrobial dosage has been changed to:  Zosyn 2.25 gm IV q6h, infuse each dose over 30 minutes  Additional comments: Lovenox 40 mg decrease to Lovenox 30 mg for CrCl < 30   Thank you for allowing pharmacy to be a part of this patient's care.  Herby Abraham, Pharm.D Use secure chat for questions 08/10/2023 5:16 PM

## 2023-08-10 NOTE — Progress Notes (Addendum)
  eLink Physician-Brief Progress Note Patient Name: Brooke Sanders DOB: Jun 08, 1946 MRN: [id number]   Date of Service  08/10/2023  HPI/Events of Note    eICU Interventions         Chart reviewed  Details of surgery 6 hours ago:  Preoperative diagnosis: Open abdomen after exploration for small bowel obstruction with finding of perforated terminal ileum Postoperative diagnosis: Same as above Procedure: 1.  Reopening of recent laparotomy 2.  Small bowel resection with primary anastomosis x 1 3.  Placement of ABThera 4. End ileostomy  Remained Hypotensive  Increase Vaso Continue to up titrate Levo  Continue PRBCs and FFP  Will obtain CBC stat even Blood products not yet finished LR bolus  Chem stat     Addendum  Surgery at bedside LA 8.4 down from 9 Continue blood products IVF  Addendum Hypotension with propofol  Continue fentanyl DC propofol Start Vesred   Addendum Hyperkalemia 6.0 Acidemia (bicarb<10) Multiple blood products Hypocalcemia 6.4/ corrected 6.8 Anuria  LR bolus Bicarb bolus Bicarb drip CA gluconate  Addendum We will switch IVF to Bicarb in sterile water LR instead of D5LR And run Levo in Non D5 containing IVF  Addendum  Bloody Vac Significant drop in Hgb Surgery was informed by bedside RN  2 U PRBCs and 2 U FFP ordered Will add 2U platelets Vit k Calcium 2gm Magnesium 1 gm    Mylo Choi 08/10/2023, 7:51 PM

## 2023-08-10 NOTE — Progress Notes (Signed)
NAME:  Brooke Sanders, MRN:  [id number], DOB:  [birth year], LOS: 4 ADMISSION DATE:  08/06/2023, CONSULTATION DATE:  1/16 REFERRING MD:  Dr. Tedra Senegal, CHIEF COMPLAINT:  SBO   History of Present Illness:  78 year old female with PMH as below, which is significant for remote SBO, HTN, HLD, hypothyroid, and sarcoidosis. She presented to [facility] 1/13 with complaints of nausea, vomiting, and abdominal pain x 3 days. Onset was 1/10 about 3 hours after eating a meal. She assumed this was due to food poisoning. Symptoms progressed to include nausea/vomiting and diarrhea. She then began to no longer have bowel movements or pass gas. She was unable to keep any PO intake including medications down. When symptoms did not resolve she presented to [facility] ED. CT scan showed evidence of small bowel obstruction.  General surgery was consulted and NGT was placed for decompression. Despite conservative management she did not improve and the decision was made to proceed to the OR. 1/16 she underwent ex lap with lysis of adhesions, small bowel resection with primary anastomosis x 3, and right colectomy. The terminal ileum and the abdomen were left in discontinuity and she was transferred to the ICU on the ventilator. PCCM was asked to evaluate for ICU care.   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   Significant Hospital Events: Including procedures, antibiotic start and stop dates in addition to other pertinent events   1/13 admit for SBO with conservative management To OR for adhesion lysis, colectomy, and small bowel resection. Post op on vent with open abd.   Interim History / Subjective:   No acute events overnight MAPs intermittently soft overnight UOP Family updated at bedside  Objective   Blood pressure (!) 91/41, pulse (!) 120, temperature (!) 100.5 F (38.1 C), temperature source Axillary, resp. rate (!) 23, height 5' (1.524 m), weight 62 kg, SpO2 98%.    Vent Mode: PRVC FiO2 (%):  [35 %-50 %] 35 % Set Rate:  [18 bmp-20 bmp] 20 bmp Vt Set:  [360 mL] 360 mL PEEP:  [5 cmH20] 5 cmH20 Plateau Pressure:  [13 cmH20] 13 cmH20   Intake/Output Summary (Last 24 hours) at 08/10/2023 0815 Last data filed at 08/10/2023 0533 Gross per 24 hour  Intake 5486.74 ml  Output 985 ml  Net 4501.74 ml   Filed Weights   08/09/23 1030 08/09/23 1509 08/10/23 0500  Weight: 54 kg 60.8 kg 62 kg    Examination: General: Thin elderly female, intubated/sedated HENT: [place]/AT, PERRL, no JVD Lungs: Clear bilateral breath sounds Cardiovascular: RRR, no MRG Abdomen: wound vac in place draining - serosanguinous fluid.  Extremities: No acute deformity or ROM limitation Neuro: sedated, PERRL GU: Foley  Resolved Hospital Problem list     Assessment & Plan:   Shock Hypovolemic vs distributive - MAP goal 65 - Fluid boluses as needed - Levophed ordered - arterial line to be placed  Respiratory insufficiency due to need for medical sedation.  - Full vent support - ABG stable this morning - VAP bundle -  Propofol and fentanyl for RASS goal -3 to -4  Acute Kidney Injury Hypophosphatemia - Monitor Cr and UOP - fluid resuscitation - avoid nephrtoxic agents - renal dose medications as needed - replete electrolytes  Small bowel obstruction: failed conservative  management, taken to OR 1/16 for adhesion lysis, colectomy, and small bowel resection. Postoperatively she was brought to the ICU for recovery with bowel in discontinuity and open abdomen with plans for return to OR 1/17 - Management per surgery - NGT, NPO - Pain management with fentanyl infusion - Monitor wound vac output.  - await return to OR - will likely need PICC for TPN  SVT: 1/13-14 two separate episodes, resolved spontaneously - telemetry monitoring  Sarcoidosis: biopsy proven - on no chronic therapy, monitor.   HTN - holding home hydrochlorothiazide  Hypothyroid - Holding home therapy while NPO - may need to start IV replacement.  Restless legs - holding home pregabalin   Best Practice (right click and "Reselect all SmartList Selections" daily)   Diet/type: NPO DVT prophylaxis LMWH Pressure ulcer(s): N/A GI prophylaxis: PPI Lines: Central line Foley:  Yes, and it is still needed Code Status:  full code Last date of multidisciplinary goals of care discussion [ ]   Labs   CBC: Recent Labs  Lab 08/06/23 1054 08/06/23 1847 08/07/23 0517 08/08/23 0539 08/09/23 0514 08/09/23 1539 08/10/23 0409  WBC 5.4 5.3 4.9 5.0 8.3 4.3 30.9*  NEUTROABS 3.7 3.2  --   --  5.1 3.2 26.9*  HGB 13.6 12.9 13.2 13.8 14.4 14.2 12.4  HCT 39.2 39.4 39.9 42.7 45.5 45.6 39.5  MCV 89.9 94.3 93.9 95.3 96.6 97.9 98.3  PLT 189 182 190 219 253 244 148*    Basic Metabolic Panel: Recent Labs  Lab 08/06/23 1847 08/07/23 0517 08/08/23 0539 08/09/23 0515 08/09/23 1539 08/10/23 0409  NA 137 137 143 145 149* 144  K 4.0 3.9 3.8 3.8 3.4* 5.0  CL 103 104 108 111 117* 114*  CO2 24 26 26 22  21* 22  GLUCOSE 118* 111* 115* 113* 115* 117*  BUN 23 24* 29* 34* 33* 41*  CREATININE 0.63 0.66 0.78 0.63 0.99 2.27*  CALCIUM 8.3* 8.0* 7.9* 7.8* 7.2* 7.0*  MG 2.6*  --  2.4 2.6* 2.1 1.9  PHOS  --   --  2.9 2.0* 4.3 2.4*   GFR: Estimated Creatinine Clearance: 16.5 mL/min (A) (by C-G formula  based on SCr of 2.27 mg/dL (H)). Recent Labs  Lab 08/06/23 1322 08/06/23 1847 08/07/23 0517 08/08/23 0539 08/09/23 0514 08/09/23 1519 08/09/23 1539 08/10/23 0409  WBC  --  5.3   < > 5.0 8.3  --  4.3 30.9*  LATICACIDVEN 0.9 1.0  --   --   --  1.2  --   --    < > = values in this interval not displayed.    Liver Function Tests: Recent Labs  Lab 08/06/23 1054 08/06/23 1847 08/09/23 0515 08/09/23 1539 08/10/23 0409  AST 15 17 12* 19 19  ALT 12 15 11 13 11   ALKPHOS 38 37* 34* 37* 25*  BILITOT 1.1 0.9 0.6 0.6 0.5  PROT 6.9 6.6 5.9* 5.1* 4.7*  ALBUMIN 4.2 3.6 3.2* 3.2* 2.6*   Recent Labs  Lab 08/06/23 1054  LIPASE 15   No results for input(s): "AMMONIA" in the last 168 hours.  ABG    Component Value Date/Time   PHART 7.35 08/10/2023 0800   PCO2ART 40 08/10/2023 0800   PO2ART 115 (H) 08/10/2023 0800  HCO3 22.1 08/10/2023 0800   ACIDBASEDEF 3.3 (H) 08/10/2023 0800   O2SAT 98.2 08/10/2023 0800     Coagulation Profile: No results for input(s): "INR", "PROTIME" in the last 168 hours.  Cardiac Enzymes: No results for input(s): "CKTOTAL", "CKMB", "CKMBINDEX", "TROPONINI" in the last 168 hours.  HbA1C: Hemoglobin A1C  Date/Time Value Ref Range Status  12/12/2022 10:36 AM 5.0 4.0 - 5.6 % Final    CBG: No results for input(s): "GLUCAP" in the last 168 hours.    Critical care time: 38 minutes     Melody Comas, MD [facility] Office: [phone]   See Amion for personal pager PCCM on call pager [phone] until 7pm. Please call Elink 7p-7a. [phone]

## 2023-08-10 NOTE — Anesthesia Postprocedure Evaluation (Signed)
Anesthesia Post Note  Patient: Brooke Sanders  Procedure(s) Performed: RE-EXPLORATION LAPAROTOMY, POSSIBLE SMALL BOWEL RESECTION AND ILEOSTOMY CREATION     Patient location during evaluation: ICU Anesthesia Type: General Level of consciousness: sedated Pain management: pain level controlled Vital Signs Assessment: post-procedure vital signs reviewed and stable Respiratory status: patient remains intubated per anesthesia plan Cardiovascular status: stable Postop Assessment: no apparent nausea or vomiting Anesthetic complications: no   No notable events documented.  Last Vitals:  Vitals:   08/10/23 1417 08/10/23 1445  BP:    Pulse:  81  Resp:  20  Temp:    SpO2: 100% 100%    Last Pain:  Vitals:   08/10/23 0826  TempSrc: Oral  PainSc:                  [name] [name]

## 2023-08-10 NOTE — Op Note (Signed)
Preoperative diagnosis: Open abdomen after exploration for small bowel obstruction with finding of perforated terminal ileum Postoperative diagnosis: Same as above Procedure: 1.  Reopening of recent laparotomy 2.  Small bowel resection with primary anastomosis x 1 3.  Placement of ABThera 4. End ileostomy Surgical Dr. Harden Mo Assistant: Carl Best, PA-C Estimated blood loss: Less than 100 cc Specimens: Small bowel to pathology Complications: None Drains: ABThera to open abdomen Sponge needle counts were correct at completion Disposition to ICU in critical condition  Indication this 78 year old female active the operating room yesterday for small bowel obstruction that was not getting better.  I ended up resecting 3 different segments of small bowel as well as doing a right colectomy.  She was tachycardic this morning on some Levophed.  I was concerned that some of her bowel might not be viable so I returned her to the operating room.  Procedure: After informed consent was obtained from her family we took her back to the operating room.  She was on antibiotics SCDs were in place.  She was placed under general anesthesia.  The VAC was removed.  She was then prepped and draped in standard sterile surgical fashion.  Surgical timeout was then performed.  I began eviscerating her bowel.  Near the second anastomosis as soon as I palpated that area it ripped.  This leaked again.  I elected to resect this anastomosis and actually connected to the first anastomosis as they were fairly close now.  I divided the bowel with GIA stapler and took the mesentery with the LigaSure device.  This bowel was all healthy.  I put these 2 ends and apposition with 3-0 silk sutures.  I then made holes in each and created a anastomosis with a GIA stapler.  I closed this with a TX stapler.  1 corner needed several stitches in it that I imbricated the bowel to oversew that.  I closed the mesenteric defect with 2-0  silk suture.  I then ran the remaining bowel a least a couple of times and this all appeared to be viable.  She has somewhere around the 150 cm of small bowel that is still present.  I then irrigated copiously.  The remainder abdomen was not concerning.  I elected to place a VAC again but this time I did make an end ileostomy.  I found the ileum.  I made a hole in the abdominal wall to the right of midline.  I chose to set a position that appeared to be good for taking care of this later.  I then cored out the fat above the fascia.  I made a cruciate incision of the fascia.  I divided the muscle and entered and the peritoneum.  The ileum was brought through this.  I then placed a ABThera VAC.  The ostomy was then matured with 3-0 Vicryl.  This was patent and already functional.  Dressings were then placed.  She tolerated this well.  She will be transferred back to the ICU and will need to return for 1 more review reevaluation of her bowel as well as abdominal closure.

## 2023-08-10 NOTE — Anesthesia Preprocedure Evaluation (Addendum)
Anesthesia Evaluation  Patient identified by MRN, date of birth, ID band Patient unresponsive    Reviewed: Allergy & Precautions, NPO status , Patient's Chart, lab work & pertinent test results  Airway Mallampati: Intubated       Dental   Pulmonary  Intubated      + intubated    Cardiovascular hypertension, Pt. on home beta blockers + CAD   Rhythm:Regular Rate:Tachycardia     Neuro/Psych Sedated  Neuromuscular disease CVA  negative psych ROS   GI/Hepatic ,,,(+) Hepatitis -OPEN ABDOMEN   Endo/Other  Hypothyroidism    Renal/GU negative Renal ROS     Musculoskeletal  (+) Arthritis ,  Fibromyalgia -  Abdominal   Peds  Hematology negative hematology ROS (+)   Anesthesia Other Findings OPEN ABDOMEN  Reproductive/Obstetrics                             Anesthesia Physical Anesthesia Plan  ASA: 4  Anesthesia Plan: General   Post-op Pain Management:    Induction:   PONV Risk Score and Plan: 3 and Treatment may vary due to age or medical condition  Airway Management Planned: Oral ETT  Additional Equipment:   Intra-op Plan:   Post-operative Plan: Post-operative intubation/ventilation  Informed Consent: I have reviewed the patients History and Physical, chart, labs and discussed the procedure including the risks, benefits and alternatives for the proposed anesthesia with the patient or authorized representative who has indicated his/her understanding and acceptance.     Consent reviewed with POA  Plan Discussed with: CRNA and Surgeon  Anesthesia Plan Comments: (NorEpi drip Arterial line in place Central line in place)       Anesthesia Quick Evaluation

## 2023-08-10 NOTE — Progress Notes (Signed)
Initial Nutrition Assessment  DOCUMENTATION CODES:   Non-severe (moderate) malnutrition in context of chronic illness  INTERVENTION:  - TPN (Clinimix 8/10) to start tonight.   - TPN management per pharmacy.   - Monitor magnesium, potassium, and phosphorus BID for at least 3 days, MD to replete as needed, as pt is at risk for refeeding syndrome given inadequate intake x1 week with malnutrition.  - Add 100 mg Thiamine x5 days.    NUTRITION DIAGNOSIS:   Moderate Malnutrition related to chronic illness as evidenced by moderate fat depletion, severe muscle depletion.  GOAL:   Patient will meet greater than or equal to 90% of their needs  MONITOR:   Vent status, Labs, Weight trends, I & O's  REASON FOR ASSESSMENT:   Consult New TPN/TNA  ASSESSMENT:   78 year old female with PMH significant for remote SBO, HTN, HLD, Grave's disease who presented 1/13 with complaints of nausea, vomiting, and abdominal pain x 3 days. Admitted for small bowel obstruction.   1/13 Admit; NGT placed for suction 1/16 s/p ex-lap with lysis of adhesions, small bowel resection, R colectomy, and ABThera placement; left in discontinuity   Patient is currently intubated on ventilator support MV: 8.9 L/min Temp (24hrs), Avg:98.5 F (36.9 C), Min:95.9 F (35.5 C), Max:100.5 F (38.1 C)  Patient's sister, daughter, and son at bedside at time of visit.   UBW reported to be 119# but family feels she has been slowly losing weight over the past several years.  Per EMR, weight stable/without significant changes over the past year.   She is reported to eat "like a bird" at home. Has grown tired of cooking so prefers premade food such as salads and lasagna. Son does grocery shopping for the patient. Feels she doesn't eat full meals, but eats large snacks throughout the day. Also drinks 1 Ensure a day at home.  Family note patient hasn't had anything to eat since last Friday, 1 week ago.   Patient taken to OR  yesterday for SBR and right colectomy. Left in discontinuity with plan to return to OR today.   Per Surgery, plan to start TPN this evening.  Discussed patient with pharmacist. Patient at risk for refeeding syndrome due to inadequate intake x1 week and malnutrition.  Pharmacist planning to start Clinimix at 61mL/hr. Plan to hold off on lipids today as patient currently receiving ~300 kcals from propofol and triglycerides elevated.    Medications reviewed and include:  Fentanyl Levophed @ 21mcg/min Propofol @ 11.37mL/hr (provides 299 kcals over 24 hours)  Labs reviewed:  Creatinine 2.27 Phosphorus 2.4 Triglycerides 152 (as of 1/17)   NUTRITION - FOCUSED PHYSICAL EXAM:  Flowsheet Row Most Recent Value  Orbital Region Severe depletion  Upper Arm Region Mild depletion  Thoracic and Lumbar Region Moderate depletion  Buccal Region Unable to assess  Temple Region Severe depletion  Clavicle Bone Region Severe depletion  Clavicle and Acromion Bone Region Severe depletion  Scapular Bone Region Unable to assess  Dorsal Hand Unable to assess  Patellar Region Mild depletion  Anterior Thigh Region Mild depletion  Posterior Calf Region Moderate depletion  Edema (RD Assessment) Mild  Hair Reviewed  Eyes Unable to assess  Mouth Unable to assess  Skin Reviewed  Nails Reviewed       Diet Order:   Diet Order             Diet NPO time specified  Diet effective now  EDUCATION NEEDS:  Education needs have been addressed  Skin:  Skin Assessment: Skin Integrity Issues: Skin Integrity Issues:: Incisions Incisions: Abdomen  Last BM:  1/11  Height:  Ht Readings from Last 1 Encounters:  08/09/23 5' (1.524 m)   Weight:  Wt Readings from Last 1 Encounters:  08/10/23 62 kg    BMI:  Body mass index is 26.69 kg/m.  Estimated Nutritional Needs:  Kcal:  1500-1750 kcals Protein:  80-95 grams Fluid:  >/= 1.5L    Shelle Iron RD, LDN Contact via Secure  Chat.

## 2023-08-11 ENCOUNTER — Inpatient Hospital Stay (HOSPITAL_COMMUNITY): Payer: Medicare Other | Admitting: Anesthesiology

## 2023-08-11 ENCOUNTER — Encounter (HOSPITAL_COMMUNITY): Admission: EM | Disposition: E | Payer: Self-pay | Source: Ambulatory Visit | Attending: Pulmonary Disease

## 2023-08-11 DIAGNOSIS — K661 Hemoperitoneum: Secondary | ICD-10-CM

## 2023-08-11 DIAGNOSIS — I2510 Atherosclerotic heart disease of native coronary artery without angina pectoris: Secondary | ICD-10-CM

## 2023-08-11 DIAGNOSIS — S31109A Unspecified open wound of abdominal wall, unspecified quadrant without penetration into peritoneal cavity, initial encounter: Secondary | ICD-10-CM | POA: Diagnosis not present

## 2023-08-11 DIAGNOSIS — I10 Essential (primary) hypertension: Secondary | ICD-10-CM | POA: Diagnosis not present

## 2023-08-11 DIAGNOSIS — K56609 Unspecified intestinal obstruction, unspecified as to partial versus complete obstruction: Secondary | ICD-10-CM | POA: Diagnosis not present

## 2023-08-11 HISTORY — PX: LAPAROTOMY: SHX154

## 2023-08-11 HISTORY — PX: APPLICATION OF WOUND VAC: SHX5189

## 2023-08-11 LAB — BASIC METABOLIC PANEL
Anion gap: 10 (ref 5–15)
Anion gap: 11 (ref 5–15)
BUN: 45 mg/dL — ABNORMAL HIGH (ref 8–23)
BUN: 42 mg/dL — ABNORMAL HIGH (ref 8–23)
CO2: 22 mmol/L (ref 22–32)
CO2: 24 mmol/L (ref 22–32)
Calcium: 5.8 mg/dL — CL (ref 8.9–10.3)
Calcium: 6 mg/dL — CL (ref 8.9–10.3)
Chloride: 106 mmol/L (ref 98–111)
Chloride: 103 mmol/L (ref 98–111)
Creatinine, Ser: 2 mg/dL — ABNORMAL HIGH (ref 0.44–1.00)
Creatinine, Ser: 1.88 mg/dL — ABNORMAL HIGH (ref 0.44–1.00)
GFR, Estimated: 25 mL/min — ABNORMAL LOW (ref >60)
GFR, Estimated: 27 mL/min — ABNORMAL LOW (ref >60)
Glucose, Bld: 273 mg/dL — ABNORMAL HIGH (ref 70–99)
Glucose, Bld: 114 mg/dL — ABNORMAL HIGH (ref 70–99)
Potassium: 4.7 mmol/L (ref 3.5–5.1)
Potassium: 3.8 mmol/L (ref 3.5–5.1)
Sodium: 138 mmol/L (ref 135–145)
Sodium: 138 mmol/L (ref 135–145)

## 2023-08-11 LAB — CBC WITH DIFFERENTIAL/PLATELET
Abs Immature Granulocytes: 0.98 10*3/uL — ABNORMAL HIGH (ref 0.00–0.07)
Abs Immature Granulocytes: 0.79 10*3/uL — ABNORMAL HIGH (ref 0.00–0.07)
Basophils Absolute: 0.1 10*3/uL (ref 0.0–0.1)
Basophils Absolute: 0 10*3/uL (ref 0.0–0.1)
Basophils Relative: 1 %
Basophils Relative: 0 %
Eosinophils Absolute: 0 10*3/uL (ref 0.0–0.5)
Eosinophils Absolute: 0 10*3/uL (ref 0.0–0.5)
Eosinophils Relative: 0 %
Eosinophils Relative: 0 %
HCT: 14.9 % — ABNORMAL LOW (ref 36.0–46.0)
HCT: 19.4 % — ABNORMAL LOW (ref 36.0–46.0)
Hemoglobin: 4.9 g/dL — CL (ref 12.0–15.0)
Hemoglobin: 6.8 g/dL — CL (ref 12.0–15.0)
Immature Granulocytes: 5 %
Immature Granulocytes: 5 %
Lymphocytes Relative: 6 %
Lymphocytes Relative: 7 %
Lymphs Abs: 1.4 10*3/uL (ref 0.7–4.0)
Lymphs Abs: 1.2 10*3/uL (ref 0.7–4.0)
MCH: 31.2 pg (ref 26.0–34.0)
MCH: 30.9 pg (ref 26.0–34.0)
MCHC: 32.9 g/dL (ref 30.0–36.0)
MCHC: 35.1 g/dL (ref 30.0–36.0)
MCV: 94.9 fL (ref 80.0–100.0)
MCV: 88.2 fL (ref 80.0–100.0)
Monocytes Absolute: 0.4 10*3/uL (ref 0.1–1.0)
Monocytes Absolute: 0.2 10*3/uL (ref 0.1–1.0)
Monocytes Relative: 2 %
Monocytes Relative: 1 %
Neutro Abs: 18.2 10*3/uL — ABNORMAL HIGH (ref 1.7–7.7)
Neutro Abs: 14.3 10*3/uL — ABNORMAL HIGH (ref 1.7–7.7)
Neutrophils Relative %: 86 %
Neutrophils Relative %: 87 %
Platelets: 74 10*3/uL — ABNORMAL LOW (ref 150–400)
Platelets: 61 10*3/uL — ABNORMAL LOW (ref 150–400)
RBC: 1.57 MIL/uL — ABNORMAL LOW (ref 3.87–5.11)
RBC: 2.2 MIL/uL — ABNORMAL LOW (ref 3.87–5.11)
RDW: 15.7 % — ABNORMAL HIGH (ref 11.5–15.5)
RDW: 15.3 % (ref 11.5–15.5)
WBC: 21 10*3/uL — ABNORMAL HIGH (ref 4.0–10.5)
WBC: 16.5 10*3/uL — ABNORMAL HIGH (ref 4.0–10.5)
nRBC: 0 % (ref 0.0–0.2)
nRBC: 0.1 % (ref 0.0–0.2)

## 2023-08-11 LAB — PREPARE RBC (CROSSMATCH)

## 2023-08-11 LAB — GLUCOSE, CAPILLARY
Glucose-Capillary: 207 mg/dL — ABNORMAL HIGH (ref 70–99)
Glucose-Capillary: 102 mg/dL — ABNORMAL HIGH (ref 70–99)
Glucose-Capillary: 56 mg/dL — ABNORMAL LOW (ref 70–99)
Glucose-Capillary: 90 mg/dL (ref 70–99)
Glucose-Capillary: 109 mg/dL — ABNORMAL HIGH (ref 70–99)

## 2023-08-11 LAB — MAGNESIUM: Magnesium: 1.7 mg/dL (ref 1.7–2.4)

## 2023-08-11 LAB — HEMOGLOBIN AND HEMATOCRIT, BLOOD
HCT: 24.6 % — ABNORMAL LOW (ref 36.0–46.0)
Hemoglobin: 8.6 g/dL — ABNORMAL LOW (ref 12.0–15.0)

## 2023-08-11 LAB — PHOSPHORUS: Phosphorus: 5 mg/dL — ABNORMAL HIGH (ref 2.5–4.6)

## 2023-08-11 SURGERY — LAPAROTOMY, EXPLORATORY
Anesthesia: General

## 2023-08-11 MED ORDER — CALCIUM GLUCONATE-NACL 2-0.675 GM/100ML-% IV SOLN
2.0000 g | Freq: Once | INTRAVENOUS | Status: AC
Start: 2023-08-11 — End: 2023-08-11
  Administered 2023-08-11: 2000 mg via INTRAVENOUS
  Filled 2023-08-11: qty 100

## 2023-08-11 MED ORDER — MAGNESIUM SULFATE IN D5W 1-5 GM/100ML-% IV SOLN
1.0000 g | Freq: Once | INTRAVENOUS | Status: AC
  Administered 2023-08-11: 1 g via INTRAVENOUS
  Filled 2023-08-11: qty 100

## 2023-08-11 MED ORDER — ROCURONIUM BROMIDE 10 MG/ML (PF) SYRINGE
PREFILLED_SYRINGE | INTRAVENOUS | Status: AC
  Filled 2023-08-11: qty 10

## 2023-08-11 MED ORDER — INFUVITE ADULT IV SOLN
INTRAVENOUS | Status: AC
Start: 2023-08-11 — End: 2023-08-12
  Filled 2023-08-11: qty 960

## 2023-08-11 MED ORDER — 0.9 % SODIUM CHLORIDE (POUR BTL) OPTIME
TOPICAL | Status: DC | PRN
  Administered 2023-08-11: 3000 mL

## 2023-08-11 MED ORDER — STERILE WATER FOR INJECTION IV SOLN
INTRAVENOUS | Status: DC
  Filled 2023-08-11 (×2): qty 150

## 2023-08-11 MED ORDER — SODIUM CHLORIDE 0.9% IV SOLUTION: Freq: Once | INTRAVENOUS | Status: DC

## 2023-08-11 MED ORDER — SODIUM CHLORIDE 0.9 % IV SOLN: INTRAVENOUS | Status: DC | PRN

## 2023-08-11 MED ORDER — SODIUM CHLORIDE 0.9% IV SOLUTION: Freq: Once | INTRAVENOUS | Status: AC

## 2023-08-11 MED ORDER — ROCURONIUM BROMIDE 10 MG/ML (PF) SYRINGE
PREFILLED_SYRINGE | INTRAVENOUS | Status: DC | PRN
  Administered 2023-08-11: 100 mg via INTRAVENOUS

## 2023-08-11 MED ORDER — LACTATED RINGERS IV SOLN: INTRAVENOUS | Status: AC

## 2023-08-11 MED ORDER — CALCIUM GLUCONATE-NACL 2-0.675 GM/100ML-% IV SOLN
2.0000 g | Freq: Once | INTRAVENOUS | Status: AC
  Administered 2023-08-11: 2000 mg via INTRAVENOUS
  Filled 2023-08-11: qty 100

## 2023-08-11 MED ORDER — VITAMIN K1 10 MG/ML IJ SOLN
5.0000 mg | Freq: Once | INTRAVENOUS | Status: AC
  Administered 2023-08-11: 5 mg via INTRAVENOUS
  Filled 2023-08-11: qty 0.5

## 2023-08-11 MED ORDER — NOREPINEPHRINE 16 MG/250ML-% IV SOLN
0.0000 ug/min | INTRAVENOUS | Status: DC
  Administered 2023-08-12: 7 ug/min via INTRAVENOUS
  Filled 2023-08-11: qty 250

## 2023-08-11 MED ORDER — LACTATED RINGERS IV SOLN: INTRAVENOUS | Status: DC | PRN

## 2023-08-11 SURGICAL SUPPLY — 28 items
BAG COUNTER SPONGE SURGICOUNT (BAG) IMPLANT
BLADE CLIPPER SURG (BLADE) IMPLANT
BLADE EXTENDED COATED 6.5IN (ELECTRODE) IMPLANT
CHLORAPREP W/TINT 26 (MISCELLANEOUS) ×1 IMPLANT
DERMABOND ADVANCED .7 DNX12 (GAUZE/BANDAGES/DRESSINGS) ×1 IMPLANT
DRAPE LAPAROSCOPIC ABDOMINAL (DRAPES) ×1 IMPLANT
DRAPE WARM FLUID 44X44 (DRAPES) ×1 IMPLANT
ELECT REM PT RETURN 15FT ADLT (MISCELLANEOUS) ×1 IMPLANT
GAUZE SPONGE 4X4 12PLY STRL (GAUZE/BANDAGES/DRESSINGS) ×1 IMPLANT
GLOVE BIO SURGEON STRL SZ7.5 (GLOVE) ×1 IMPLANT
GOWN STRL REUS W/ TWL XL LVL3 (GOWN DISPOSABLE) IMPLANT
HANDLE SUCTION POOLE (INSTRUMENTS) ×1 IMPLANT
KIT BASIN OR (CUSTOM PROCEDURE TRAY) ×1 IMPLANT
KIT TURNOVER KIT A (KITS) IMPLANT
NS IRRIG 1000ML POUR BTL (IV SOLUTION) ×2 IMPLANT
PACK GENERAL/GYN (CUSTOM PROCEDURE TRAY) ×1 IMPLANT
PROTECTOR NERVE ULNAR (MISCELLANEOUS) ×2 IMPLANT
SPONGE ABDOMINAL VAC ABTHERA (MISCELLANEOUS) IMPLANT
STAPLER SKIN PROX WIDE 3.9 (STAPLE) ×1 IMPLANT
SUCTION POOLE HANDLE (INSTRUMENTS) ×1 IMPLANT
SUT PDS AB 1 TP1 96 (SUTURE) ×2 IMPLANT
SUT SILK 2 0 SH CR/8 (SUTURE) ×1 IMPLANT
SUT SILK 2-0 30XBRD TIE 12 (SUTURE) ×1 IMPLANT
SUT SILK 3 0 12 30 (SUTURE) ×1 IMPLANT
SUT SILK 3 0 SH CR/8 (SUTURE) ×1 IMPLANT
TOWEL OR 17X26 10 PK STRL BLUE (TOWEL DISPOSABLE) ×2 IMPLANT
TRAY FOLEY MTR SLVR 16FR STAT (SET/KITS/TRAYS/PACK) IMPLANT
YANKAUER SUCT BULB TIP NO VENT (SUCTIONS) IMPLANT

## 2023-08-11 NOTE — Anesthesia Preprocedure Evaluation (Signed)
Anesthesia Evaluation  Patient identified by MRN, date of birth, ID band Patient unresponsive    Reviewed: Allergy & Precautions, NPO status , Patient's Chart, lab work & pertinent test results  Airway Mallampati: Intubated       Dental   Pulmonary  Intubated      + intubated    Cardiovascular hypertension, Pt. on home beta blockers + CAD   Rhythm:Regular Rate:Tachycardia     Neuro/Psych Sedated  Neuromuscular disease CVA  negative psych ROS   GI/Hepatic ,,,(+) Hepatitis -OPEN ABDOMEN   Endo/Other  Hypothyroidism    Renal/GU negative Renal ROS     Musculoskeletal  (+) Arthritis ,  Fibromyalgia -  Abdominal   Peds  Hematology negative hematology ROS (+)   Anesthesia Other Findings OPEN ABDOMEN  Reproductive/Obstetrics                             Anesthesia Physical Anesthesia Plan  ASA: 4  Anesthesia Plan: General   Post-op Pain Management:    Induction:   PONV Risk Score and Plan: 3 and Treatment may vary due to age or medical condition  Airway Management Planned: Oral ETT  Additional Equipment:   Intra-op Plan:   Post-operative Plan: Post-operative intubation/ventilation  Informed Consent: I have reviewed the patients History and Physical, chart, labs and discussed the procedure including the risks, benefits and alternatives for the proposed anesthesia with the patient or authorized representative who has indicated his/her understanding and acceptance.     Consent reviewed with POA  Plan Discussed with: CRNA and Surgeon  Anesthesia Plan Comments: (NorEpi drip Arterial line in place Central line in place)       Anesthesia Quick Evaluation

## 2023-08-11 NOTE — Transfer of Care (Signed)
Immediate Anesthesia Transfer of Care Note  Patient: Brooke Sanders  Procedure(s) Performed: EXPLORATORY LAPAROTOMY APPLICATION OF WOUND VAC  Patient Location: ICU  Anesthesia Type:General  Level of Consciousness: Patient remains intubated per anesthesia plan  Airway & Oxygen Therapy: Patient remains intubated per anesthesia plan  Post-op Assessment: Report given to RN, Post -op Vital signs reviewed and stable, and BP 187/63  Pulse 63   O2 Sat 100%  Post vital signs: Reviewed and stable  Last Vitals:  Vitals Value Taken Time  BP    Temp    Pulse    Resp    SpO2      Last Pain:  Vitals:   08/11/23 0757  TempSrc: Esophageal  PainSc:          Complications: No notable events documented.

## 2023-08-11 NOTE — Progress Notes (Addendum)
PHARMACY - TOTAL PARENTERAL NUTRITION CONSULT NOTE   Indication: massive bowel resection  Patient Measurements: Height: 5' (152.4 cm) Weight: 68.2 kg (150 lb 5.7 oz) IBW/kg (Calculated) : 45.5 TPN AdjBW (KG): 49.3 Body mass index is 29.36 kg/m. Usual Weight: ~55 kg in November (weight has actually increased  Assessment:  56 yoF admitted with n/v, abdominal pain starting 1/10. Failed conservative management, and required exploratory laparotomy on 1/16. Surgery revealed ischemia and necrosis of the terminal ileum/cecum, requiring resection of this as well as the ascending colon. Bowel was left in discontinuity for now. Pharmacy consulted to start TPN on 1/17 d/t anticipated prolonged NPO status.  Significant events: 1/17: TPN mixed and delivered to ICU, but not given d/t limited access and need for multiple critical medications (pressors, incompatible fluids, etc). Also noted to have significant bleeding from open abdomen throughout the night, requiring multiple units of PRBC and IVF resuscitation  Glucose: No Hx DM - goal 100-150 - some erratic CBGs perioperatively, but otherwise well controlled w/ minimal SSI (3 units yesterday) Electrolytes: K back to WNL as renal function improved slightly; Phos now elevated; Ca, Mg low (both repleted overnight by CCM); all others WNL Renal: AKI slightly improved today; BUN continues to trend up however; UOP remains either very low or incompletely charted Hepatic: (1/17) LFTs, Tbili WNL; albumin remains moderately low - TG borderline elevated (propofol started immediately postop) I/O: significant blood loss (2900 ml) overnight; 1500 ml today (including surgical evacuation of hematoma) - currently on LR + Bicarb, both running at 125 ml/hr, in addition to other infusions totaling ~45 ml/hr, for combined IV intake of ~300 ml/hr - minimal NG output yesterday (~100 ml) - propofol infusion switched to versed GI Imaging: - 1/13 CT a/p: SBO with multiple  transition points, possible ischemia; biliary duct dilatation - 1/13 and 1/15 AXRs show worsening SBO GI Surgeries / Procedures:  - 1/16 ex lap: resection x 3 of terminal ileum and R hemicolectomy; abdomen left open & bowel discontinuous - 1/17 return to OR for second look: further resection of unviable small bowel; creation of new anastomosis and end ileostomy (approx 150 cm SB remaining); abd remains open - 1/18 emergent return to OR for hemoperitoneum: evacuation of clot and cauterization of bleeding at 1 of the 2 anastomotic sites; abd remains open  Central access: R internal jugular placed 1/16 in OR; 3x lumen HD cath placed in femoral vein 1/18 TPN start date: 1/17 (not given 1/17)  Nutritional Goals: RD Assessment: pending Estimated Needs Total Energy Estimated Needs: 1500-1750 kcals Total Protein Estimated Needs: 80-95 grams Total Fluid Estimated Needs: >/= 1.5L TPN as below provides 77 g protein and 630 kcal/day (~950 kcal with propofol at current rate); f/u RD recommendations to determine appropriate product & rate  Current Nutrition:  NPO  Due to the Baxter IV fluid disruption, all adult parenteral nutrition will utilize premade Clinimix products +/- fat emulsion infusion. Our goal will be to continue providing as close to 100% of our patient's nutritional needs, but due to limited TPN Clinimix concentrations we will attempt to meet at least 80% of protein and 75% of kcal needs.    Plan:  Ca, Mg already replaced this AM per CCM  Given TPN never started yesterday, and clinical situation mostly unchanged, will plan to start TPN as ordered yesterday  Start plain (no lytes) Clinimix 8/10 at 40 mL/hr at 1800 Given relatively high contribution of lipid calories if included, as well as borderline hypertriglyceridemia, will hold IV lipids  today Add standard MVI to TPN Trace elements and chromium currently on hold d/t national shortage Per RD recommendations, will add thiamine 100  mg IV x 5 days for refeeding risk Continue very sensitive scale SSI with q8 hr CBG checks and adjust as needed  MVIF per MD (CCM will adjust mIVF pending repeat BMP) Monitor TPN labs on Mon/Thurs BMP, Phos, Mg x 3d  Bernadene Person, PharmD, BCPS [phone] 08/11/2023, 11:48 AM

## 2023-08-11 NOTE — Procedures (Signed)
Central Venous Catheter Insertion Procedure Note  Brooke Sanders  [id number]  [birth year]  Date:08/11/23  Time:3:39 AM   Provider Performing:Brooke Sanders   Procedure: Insertion of Non-tunneled Central Venous Catheter(36556) with US guidance (40981)   Indication(s) Difficult access  Consent Unable to obtain consent due to emergent nature of procedure.  Anesthesia Topical only with 1% lidocaine   Timeout Verified patient identification, verified procedure, site/side was marked, verified correct patient position, special equipment/implants available, medications/allergies/relevant history reviewed, required imaging and test results available.  Sterile Technique Maximal sterile technique including full sterile barrier drape, hand hygiene, sterile gown, sterile gloves, mask, hair covering, sterile ultrasound probe cover (if used).  Procedure Description Area of catheter insertion was cleaned with chlorhexidine and draped in sterile fashion.  With real-time ultrasound guidance a HD catheter was placed into the right femoral vein. Nonpulsatile blood flow and easy flushing noted in all ports.  The catheter was sutured in place and sterile dressing applied.  I evaluated the left internal jugular first but vessel was not large enough to house large bore resuscitation line.   Complications/Tolerance None; patient tolerated the procedure well. Chest X-ray is ordered to verify placement for internal jugular or subclavian cannulation.   Chest x-ray is not ordered for femoral cannulation.  EBL Minimal  Specimen(s) None

## 2023-08-11 NOTE — Plan of Care (Signed)
  Problem: Clinical Measurements: Goal: Ability to maintain clinical measurements within normal limits will improve Outcome: Progressing Goal: Respiratory complications will improve Outcome: Progressing Goal: Cardiovascular complication will be avoided Outcome: Progressing   Problem: Elimination: Goal: Will not experience complications related to urinary retention Outcome: Progressing   Problem: Pain Management: Goal: General experience of comfort will improve Outcome: Progressing   Problem: Clinical Measurements: Goal: Will remain free from infection Outcome: Not Progressing Goal: Diagnostic test results will improve Outcome: Not Progressing   Problem: Nutrition: Goal: Adequate nutrition will be maintained Outcome: Not Progressing   Problem: Elimination: Goal: Will not experience complications related to bowel motility Outcome: Not Progressing

## 2023-08-11 NOTE — Progress Notes (Signed)
1 Day Post-Op   Subjective/Chief Complaint: PT required some blood and FFP overnight May have had some continued oozing overnight   Objective: Vital signs in last 24 hours: Temp:  [96.3 F (35.7 C)-98.8 F (37.1 C)] 97.2 F (36.2 C) (01/18 0645) Pulse Rate:  [59-120] 60 (01/18 0645) Resp:  [13-30] 25 (01/18 0645) BP: (76-160)/(37-72) 152/54 (01/18 0630) SpO2:  [90 %-100 %] 100 % (01/18 0645) Arterial Line BP: (76-160)/(36-59) 146/53 (01/18 0645) FiO2 (%):  [28 %-100 %] 50 % (01/18 0030) Weight:  [68.2 kg] 68.2 kg (01/18 0500) Last BM Date : 08/04/23  Intake/Output from previous day: 01/17 0701 - 01/18 0700 In: 10955.7 [I.V.:5727.4; Blood:2056.7; IV Piggyback:3171.6] Out: 3225 [Urine:225; Emesis/NG output:100; Drains:2900] Intake/Output this shift: No intake/output data recorded.  PE:  Constitutional: No acute distress, conversant, appears states age. Eyes: Anicteric sclerae, moist conjunctiva, no lid lag Lungs: Clear to auscultation bilaterally, normal respiratory effort, intubated CV: regular rate and rhythm, no murmurs, no peripheral edema, pedal pulses 2+ GI: Soft, no masses or hepatosplenomegaly, non-tender to palpation, vac in place, bloody output Skin: No rashes, palpation reveals normal turgor    Lab Results:  Recent Labs    08/10/23 2024 08/11/23 0119  WBC 36.2* 21.0*  HGB 10.8* 4.9*  HCT 33.8* 14.9*  PLT 110* 74*   BMET Recent Labs    08/10/23 2024 08/11/23 0119  NA 136 138  K 6.0* 4.7  CL 108 106  CO2 10* 22  GLUCOSE 174* 273*  BUN 41* 45*  CREATININE 2.67* 2.00*  CALCIUM 6.1* 5.8*   PT/INR Recent Labs    08/10/23 1616 08/10/23 2302  LABPROT 22.9* 24.1*  INR 2.0* 2.1*   ABG Recent Labs    08/10/23 1600 08/10/23 1803  PHART 7.29* 7.28*  HCO3 20.2 15.2*    Studies/Results: DG CHEST PORT 1 VIEW Result Date: 08/10/2023 CLINICAL DATA:  Hypoxia EXAM: PORTABLE CHEST 1 VIEW COMPARISON:  08/09/2023 FINDINGS: Endotracheal tube, NG  tube and right central line remain in place, unchanged. Linear opacities in the lung bases compatible with atelectasis. No effusions or pneumothorax. Heart and mediastinal contours within normal limits. No acute bony abnormality. IMPRESSION: Bibasilar atelectasis. No active disease. Electronically Signed   By: Charlett Nose M.D.   On: 08/10/2023 17:24   [place] EKG SITE RITE Result Date: 08/10/2023 If Site Rite image not attached, placement could not be confirmed due to current cardiac rhythm.  DG CHEST PORT 1 VIEW Result Date: 08/09/2023 CLINICAL DATA:  Intubated EXAM: PORTABLE CHEST 1 VIEW COMPARISON:  09/06/2022 FINDINGS: Endotracheal tube tip is about 4.2 cm superior to the carina. Esophageal tube tip within the proximal stomach. Subsegmental atelectasis in the right lower lung. Probable small left effusion with left basilar atelectasis. Right IJ central venous catheter tip at the cavoatrial region. Normal cardiac size with aortic atherosclerosis IMPRESSION: 1. Endotracheal tube tip about 4.2 cm superior to the carina. Esophageal tube tip within the proximal stomach. 2. Probable small left effusion with left basilar atelectasis. Subsegmental atelectasis in the right lower lung. Electronically Signed   By: Jasmine Pang M.D.   On: 08/09/2023 19:28    Anti-infectives: Anti-infectives (From admission, onward)    Start     Dose/Rate Route Frequency Ordered Stop   08/11/23 0000  piperacillin-tazobactam (ZOSYN) IVPB 2.25 g        2.25 g 100 mL/hr over 30 Minutes Intravenous Every 6 hours 08/10/23 1712     08/09/23 1545  piperacillin-tazobactam (ZOSYN) IVPB 3.375 g  Status:  Discontinued        3.375 g 12.5 mL/hr over 240 Minutes Intravenous Every 8 hours 08/09/23 1456 08/10/23 1712   08/09/23 0630  cefTRIAXone (ROCEPHIN) 2 g in sodium chloride 0.9 % 100 mL IVPB        2 g 200 mL/hr over 30 Minutes Intravenous On call 08/09/23 0614 08/09/23 1900       Assessment/Plan: s/p  Procedure(s): RE-EXPLORATION LAPAROTOMY, POSSIBLE SMALL BOWEL RESECTION AND ILEOSTOMY CREATION (N/A) 1.   OR today for exploration. 2.  Discussed with the patient's son the risk and benefits of the procedure to include but not limited to: Infection, bleeding, damage to surrounding structures, possible need for the surgery.  He voiced understanding wished to proceed.  LOS: 5 days    Axel Filler 08/11/2023

## 2023-08-11 NOTE — Anesthesia Postprocedure Evaluation (Signed)
Anesthesia Post Note  Patient: Brooke Sanders  Procedure(s) Performed: EXPLORATORY LAPAROTOMY APPLICATION OF WOUND VAC     Patient location during evaluation: ICU Anesthesia Type: General Level of consciousness: awake and alert Pain management: pain level controlled Vital Signs Assessment: post-procedure vital signs reviewed and stable Respiratory status: patient remains intubated per anesthesia plan Cardiovascular status: blood pressure returned to baseline and stable Postop Assessment: no apparent nausea or vomiting Anesthetic complications: no   No notable events documented.  Last Vitals:  Vitals:   08/11/23 1015 08/11/23 1030  BP:    Pulse: (!) 56 (!) 54  Resp: (!) 25 (!) 25  Temp: (!) 35.4 C (!) 35.4 C  SpO2: 100% 100%    Last Pain:  Vitals:   08/11/23 0800  TempSrc: Esophageal  PainSc:                  Lowella Curb

## 2023-08-11 NOTE — Op Note (Signed)
08/11/2023  8:54 AM  PATIENT:  Brooke Sanders  78 y.o. female  PRE-OPERATIVE DIAGNOSIS:  Open Abdomen, hemoperitoneum  POST-OPERATIVE DIAGNOSIS:  Open Abdomen, hemoperitoneum  PROCEDURE:  Procedure(s): EXPLORATORY LAPAROTOMY (N/A) APPLICATION OF WOUND VAC (N/A)  SURGEON:  Surgeons and Role:    * Axel Filler, MD - Primary  PHYSICIAN ASSISTANT:   ASSISTANTS: none   ANESTHESIA:   general  EBL:  1500 mL   BLOOD ADMINISTERED: Per anesthesia report  DRAINS: none   LOCAL MEDICATIONS USED:  NONE  SPECIMEN:  No Specimen  DISPOSITION OF SPECIMEN:  N/A  COUNTS:  YES  TOURNIQUET:  * No tourniquets in log *  DICTATION: .Dragon Dictation  Indication procedure: Patient is a 78 year old female who recently underwent ex lap for anastomosis of small bowel.  She previously had had an ex lap for SBO. Patient had continued to bleed after her recent operation per the wound VAC and required blood products.  Secondary to this patient was taken back to the operating urgently for reexploration.  Findings: Patient with approximately 1.5 L of hemoperitoneum mainly clotted blood.  There was an area of the first anastomosis, the edge, that had little bit of a ooze.  0 silk was used.  Fashion to obtain hemostasis.  I ran the small intestine from the ligament of Treitz out to the ileostomy.  There is no further bleeding.  The second anastomosis.  Be intact and without any bleeding.  All clot was removed.  Details of procedure: After the patient is consented patient seen back to the operative placed supine position bilateral studies placed.  She underwent general anesthesia.  She was already intubated.  Patient was then prepped and draped in standard fashion.  A timeout was called and all facts were verified.  The previous ABThera VAC was removed.  Upon doing so there was a large 1 of clot sitting above the omentum in the middle portion of the abdomen.  This was evacuated.  I proceeded to  remove all of the blood clot from all 4 quadrants.   There was some bright red blood from the left upper quadrant visualized.  This time I began to run the small bowel from the retrace distally.  The first anastomosis was encountered.  There was some bright red bleeding, oozing coming from the edge of the anastomosis.  This was in piece of adipose tissue at the edge.  This was cauterized and a 3-0 silk was used x 2 in a figure-of-eight fashion to obtain hemostasis.  The mesentery was hemostatic.  I continued around the bowel distally and encountered the second anastomosis.  The second anastomosis was intact.  There is no bleeding coming from this area at the anastomosis with the mesentery.  I continued to run this towards the ileostomy.  This was intact without any bleeding.  The Hartman's end of the large intestine was visualized as well as the left colon and sigmoid colon.  There is no bleeding from the area.  All clot was then removed from the left upper quadrant and right upper quadrant.  There is no further bleeding seen.  The abdomen was irrigated out with sterile saline.  At this time I was confident can control the the minimal bleeding that was.  And ABThera VAC was replaced in the abdomen.  Patient was taken back to the ICU intubated and in critical but stable condition.  PLAN OF CARE: Admit to inpatient   PATIENT DISPOSITION:  ICU - extubated and stable.  Delay start of Pharmacological VTE agent (>24hrs) due to surgical blood loss or risk of bleeding: yes

## 2023-08-11 NOTE — Progress Notes (Signed)
NAME:  Brooke Sanders, MRN:  [id number], DOB:  [birth year], LOS: 5 ADMISSION DATE:  08/06/2023, CONSULTATION DATE:  1/16 REFERRING MD:  Dr. Tedra Sen[place], CHIEF COMPLAINT:  SBO   History of Present Illness:  78 year old female with PMH as below, which is significant for remote SBO, HTN, HLD, hypothyroid, and sarcoidosis. She presented to [facility] 1/13 with complaints of nausea, vomiting, and abdominal pain x 3 days. Onset was 1/10 about 3 hours after eating a meal. She assumed this was due to food poisoning. Symptoms progressed to include nausea/vomiting and diarrhea. She then began to no longer have bowel movements or pass gas. She was unable to keep any PO intake including medications down. When symptoms did not resolve she presented to [facility] ED. CT scan showed evidence of small bowel obstruction.  General surgery was consulted and NGT was placed for decompression. Despite conservative management she did not improve and the decision was made to proceed to the OR. 1/16 she underwent ex lap with lysis of adhesions, small bowel resection with primary anastomosis x 3, and right colectomy. The terminal ileum and the abdomen were left in discontinuity and she was transferred to the ICU on the ventilator. PCCM was asked to evaluate for ICU care.   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   Significant Hospital Events: Including procedures, antibiotic start and stop dates in addition to other pertinent events   1/13 admit for SBO with conservative management To OR for adhesion lysis, colectomy, and small bowel resection. Post op on vent with open abd.  1/17 returned to OR 1/18 returned to OR for wash out  Interim History / Subjective:   CVL placed overnight for more IV access She went to OR today for wash out and evaluation of bleeding  Objective   Blood pressure (!) 123/48, pulse 70, temperature (!) 100.8 F (38.2 C), resp. rate (!) 25, height 5' (1.524 m), weight 68.2 kg, SpO2 100%.    Vent Mode: PRVC FiO2 (%):  [40 %-100 %] 40 % Set Rate:  [25 bmp] 25 bmp Vt Set:  [360 mL] 360 mL PEEP:  [5 cmH20] 5 cmH20 Plateau Pressure:  [11 cmH20-15 cmH20] 15 cmH20   Intake/Output Summary (Last 24 hours) at 08/11/2023 1726 Last data filed at 08/11/2023 1700 Gross per 24 hour  Intake 12128.72 ml  Output 4055 ml  Net 8073.72 ml   Filed Weights   08/09/23 1509 08/10/23 0500 08/11/23 0500  Weight: 60.8 kg 62 kg 68.2 kg    Examination: General: Thin elderly female, intubated/sedated HENT: [place]/AT, PERRL, no JVD Lungs: Clear bilateral breath sounds Cardiovascular: RRR, no MRG Abdomen: wound vac in place draining - serous fluid.  Extremities: No acute deformity or ROM limitation Neuro: sedated, PERRL GU: Foley  Resolved Hospital Problem list     Assessment & Plan:   Shock Hypovolemic vs distributive - MAP goal 65 - continue vasopressor support  Respiratory insufficiency due to need for medical sedation.  - Full vent support - ABG stable this morning -  VAP bundle - Propofol and fentanyl for RASS goal -3 to -4  Acute Kidney Injury Hypophosphatemia Hypocalcemia - Monitor Cr and UOP - fluid resuscitation - avoid nephrtoxic agents - renal dose medications as needed - replete electrolytes  Small bowel obstruction: failed conservative management,  taken to OR 1/16 for adhesion lysis, colectomy, and small bowel resection. Postoperatively she was brought to the ICU for recovery with bowel in discontinuity and open abdomen with plans for return to OR 1/17 - Management per surgery - NGT, NPO - Pain management with fentanyl infusion - Monitor wound vac output.  - await return to OR - TPN to start tonight  Acute Blood Loss Anemia Thrombocytopenia - transfuse for hemoglobin to maintain hemoglobin 7g/dL or greater  SVT: 6/29-52 two separate episodes, resolved spontaneously - telemetry monitoring  Sarcoidosis: biopsy proven - on no chronic therapy, monitor.   HTN - holding home hydrochlorothiazide  Hypothyroid - Holding home therapy while NPO - may need to start IV replacement.  Restless legs - holding home pregabalin   Best Practice (right click and "Reselect all SmartList Selections" daily)   Diet/type: NPO, tpn DVT prophylaxis LMWH Pressure ulcer(s): N/A GI prophylaxis: PPI Lines: Central line Foley:  Yes, and it is still needed Code Status:  full code Last date of multidisciplinary goals of care discussion [ ]   Labs   CBC: Recent Labs  Lab 08/09/23 0514 08/09/23 1539 08/10/23 0409 08/10/23 1535 08/10/23 2024 08/11/23 0119 08/11/23 1107  WBC 8.3 4.3 30.9* 30.0* 36.2* 21.0* 16.5*  NEUTROABS 5.1 3.2 26.9*  --   --  18.2* 14.3*  HGB 14.4 14.2 12.4 9.8* 10.8* 4.9* 6.8*  HCT 45.5 45.6 39.5 31.3* 33.8* 14.9* 19.4*  MCV 96.6 97.9 98.3 99.4 96.6 94.9 88.2  PLT 253 244 148* 156 110* 74* 61*    Basic Metabolic Panel: Recent Labs  Lab 08/08/23 0539 08/09/23 0515 08/09/23 1539 08/10/23 0409 08/10/23 2024 08/11/23 0119 08/11/23 1107  NA 143 145 149* 144 136 138 138  K 3.8 3.8 3.4* 5.0 6.0* 4.7 3.8  CL 108 111 117* 114* 108 106 103  CO2 26 22 21* 22 10* 22 24  GLUCOSE 115* 113* 115* 117* 174* 273* 114*  BUN 29* 34* 33* 41* 41* 45* 42*  CREATININE 0.78 0.63 0.99 2.27* 2.67* 2.00* 1.88*  CALCIUM 7.9* 7.8*  7.2* 7.0* 6.1* 5.8* 6.0*  MG 2.4 2.6* 2.1 1.9  --  1.7  --   PHOS 2.9 2.0* 4.3 2.4*  --  5.0*  --    GFR: Estimated Creatinine Clearance: 20.9 mL/min (A) (by C-G formula based on SCr of 1.88 mg/dL (H)). Recent Labs  Lab 08/06/23 1847 08/07/23 0517 08/09/23 1519 08/09/23 1539 08/10/23 1535 08/10/23 2024 08/10/23 2302 08/11/23 0119 08/11/23 1107  WBC 5.3   < >  --    < > 30.0* 36.2*  --  21.0* 16.5*  LATICACIDVEN 1.0  --  1.2  --   --  8.4* 5.6*  --   --    < > = values in this interval not displayed.    Liver Function Tests: Recent Labs  Lab 08/06/23 1054 08/06/23 1847 08/09/23 0515 08/09/23 1539 08/10/23 0409  AST 15 17 12* 19 19  ALT 12 15 11 13 11   ALKPHOS 38 37* 34* 37* 25*  BILITOT 1.1 0.9 0.6 0.6 0.5  PROT 6.9 6.6 5.9* 5.1* 4.7*  ALBUMIN 4.2 3.6 3.2* 3.2* 2.6*   Recent Labs  Lab 08/06/23 1054  LIPASE 15  No results for input(s): "AMMONIA" in the last 168 hours.  ABG    Component Value Date/Time   PHART 7.28 (L) 08/10/2023 1803   PCO2ART 32 08/10/2023 1803   PO2ART 381 (H) 08/10/2023 1803   HCO3 15.2 (L) 08/10/2023 1803   ACIDBASEDEF 10.6 (H) 08/10/2023 1803   O2SAT 100 08/10/2023 1803     Coagulation Profile: Recent Labs  Lab 08/10/23 1616 08/10/23 2302  INR 2.0* 2.1*    Cardiac Enzymes: No results for input(s): "CKTOTAL", "CKMB", "CKMBINDEX", "TROPONINI" in the last 168 hours.  HbA1C: Hemoglobin A1C  Date/Time Value Ref Range Status  12/12/2022 10:36 AM 5.0 4.0 - 5.6 % Final    CBG: Recent Labs  Lab 08/10/23 2350 08/11/23 0417 08/11/23 1144 08/11/23 1146 08/11/23 1532  GLUCAP 192* 207* 56* 102* 90      Critical care time: 35 minutes     Melody Comas, MD [facility] Office: [phone]   See Amion for personal pager PCCM on call pager [phone] until 7pm. Please call Elink 7p-7a. [phone]

## 2023-08-11 NOTE — Plan of Care (Signed)

## 2023-08-12 ENCOUNTER — Inpatient Hospital Stay (HOSPITAL_COMMUNITY): Payer: Medicare Other

## 2023-08-12 DIAGNOSIS — K56609 Unspecified intestinal obstruction, unspecified as to partial versus complete obstruction: Secondary | ICD-10-CM | POA: Diagnosis not present

## 2023-08-12 DIAGNOSIS — E440 Moderate protein-calorie malnutrition: Secondary | ICD-10-CM | POA: Insufficient documentation

## 2023-08-12 LAB — CBC WITH DIFFERENTIAL/PLATELET
Abs Immature Granulocytes: 0.72 10*3/uL — ABNORMAL HIGH (ref 0.00–0.07)
Basophils Absolute: 0 10*3/uL (ref 0.0–0.1)
Basophils Relative: 0 %
Eosinophils Absolute: 0 10*3/uL (ref 0.0–0.5)
Eosinophils Relative: 0 %
HCT: 30.4 % — ABNORMAL LOW (ref 36.0–46.0)
Hemoglobin: 10.4 g/dL — ABNORMAL LOW (ref 12.0–15.0)
Immature Granulocytes: 3 %
Lymphocytes Relative: 4 %
Lymphs Abs: 0.9 10*3/uL (ref 0.7–4.0)
MCH: 30.7 pg (ref 26.0–34.0)
MCHC: 34.2 g/dL (ref 30.0–36.0)
MCV: 89.7 fL (ref 80.0–100.0)
Monocytes Absolute: 0.3 10*3/uL (ref 0.1–1.0)
Monocytes Relative: 1 %
Neutro Abs: 21.7 10*3/uL — ABNORMAL HIGH (ref 1.7–7.7)
Neutrophils Relative %: 92 %
Platelets: 63 10*3/uL — ABNORMAL LOW (ref 150–400)
RBC: 3.39 MIL/uL — ABNORMAL LOW (ref 3.87–5.11)
RDW: 15.1 % (ref 11.5–15.5)
WBC: 23.7 10*3/uL — ABNORMAL HIGH (ref 4.0–10.5)
nRBC: 0 % (ref 0.0–0.2)

## 2023-08-12 LAB — GLUCOSE, CAPILLARY
Glucose-Capillary: 122 mg/dL — ABNORMAL HIGH (ref 70–99)
Glucose-Capillary: 132 mg/dL — ABNORMAL HIGH (ref 70–99)
Glucose-Capillary: 134 mg/dL — ABNORMAL HIGH (ref 70–99)
Glucose-Capillary: 143 mg/dL — ABNORMAL HIGH (ref 70–99)
Glucose-Capillary: 157 mg/dL — ABNORMAL HIGH (ref 70–99)
Glucose-Capillary: 169 mg/dL — ABNORMAL HIGH (ref 70–99)

## 2023-08-12 LAB — BASIC METABOLIC PANEL
Anion gap: 7 (ref 5–15)
BUN: 51 mg/dL — ABNORMAL HIGH (ref 8–23)
CO2: 26 mmol/L (ref 22–32)
Calcium: 6.4 mg/dL — CL (ref 8.9–10.3)
Chloride: 104 mmol/L (ref 98–111)
Creatinine, Ser: 2.2 mg/dL — ABNORMAL HIGH (ref 0.44–1.00)
GFR, Estimated: 22 mL/min — ABNORMAL LOW (ref >60)
Glucose, Bld: 145 mg/dL — ABNORMAL HIGH (ref 70–99)
Potassium: 3.6 mmol/L (ref 3.5–5.1)
Sodium: 137 mmol/L (ref 135–145)

## 2023-08-12 LAB — PHOSPHORUS: Phosphorus: 4.3 mg/dL (ref 2.5–4.6)

## 2023-08-12 LAB — MAGNESIUM: Magnesium: 1.8 mg/dL (ref 1.7–2.4)

## 2023-08-12 MED ORDER — CALCIUM GLUCONATE-NACL 1-0.675 GM/50ML-% IV SOLN
1.0000 g | Freq: Once | INTRAVENOUS | Status: AC
Start: 2023-08-12 — End: 2023-08-12
  Administered 2023-08-12: 1000 mg via INTRAVENOUS
  Filled 2023-08-12: qty 50

## 2023-08-12 MED ORDER — POTASSIUM CHLORIDE 10 MEQ/50ML IV SOLN
10.0000 meq | INTRAVENOUS | Status: AC
  Administered 2023-08-12 (×3): 10 meq via INTRAVENOUS
  Filled 2023-08-12 (×3): qty 50

## 2023-08-12 MED ORDER — INFUVITE ADULT IV SOLN
INTRAVENOUS | Status: AC
  Filled 2023-08-12: qty 1200

## 2023-08-12 MED ORDER — DEXTROSE 10 % IV SOLN: INTRAVENOUS | Status: AC

## 2023-08-12 NOTE — Progress Notes (Signed)
PHARMACY - TOTAL PARENTERAL NUTRITION CONSULT NOTE   Indication: massive bowel resection  Patient Measurements: Height: 5' (152.4 cm) Weight: 68.2 kg (150 lb 5.7 oz) IBW/kg (Calculated) : 45.5 TPN AdjBW (KG): 49.3 Body mass index is 29.36 kg/m. Usual Weight: ~55 kg in November (weight has actually increased  Assessment:  79 yoF admitted with n/v, abdominal pain starting 1/10. Failed conservative management, and required exploratory laparotomy on 1/16. Surgery revealed ischemia and necrosis of the terminal ileum/cecum, requiring resection of this as well as the ascending colon. Bowel was left in discontinuity for now. Pharmacy consulted to start TPN on 1/17 d/t anticipated prolonged NPO status.  Significant events: 1/17: TPN mixed and delivered to ICU, but not given d/t limited access and need for multiple critical medications (pressors, incompatible fluids, etc). Also noted to have significant bleeding from open abdomen throughout the night, requiring multiple units of PRBC and IVF resuscitation  Glucose: No Hx DM - goal 100-150 - CBGs well controlled w/ minimal SSI (2 units yesterday) - note current TPN is especially low in dextrose Electrolytes: K WNL but borderline low today; Ca remains low but somewhat improved after repletion yesterday; Phos trending back down to WNL; Na, Mg, Cl, bicarb all relatively stable WNL Renal: AKI worse again today after some minimal improvement yesterday; BUN continues to trend up; UOP remains low but improved from prior days Hepatic: (1/17) LFTs, Tbili WNL; albumin remains moderately low - TG borderline elevated (propofol started immediately postop) I/O: received about 4L in blood products/FFP over the past 2 days but has been stable since yesterday evening; Bicarb & LR running at 250 ml/hr combined have also been stopped - significantly less output from ABThera today - NG output also decreasing - no significant ileostomy output yet GI Imaging: - 1/13  CT a/p: SBO with multiple transition points, possible ischemia; biliary duct dilatation - 1/13 and 1/15 AXRs show worsening SBO GI Surgeries / Procedures:  - 1/16 ex lap: resection x 3 of terminal ileum and R hemicolectomy; abdomen left open & bowel discontinuous - 1/17 return to OR for second look: further resection of unviable small bowel; creation of new anastomosis and end ileostomy (approx 150 cm SB remaining); abd remains open - 1/18 emergent return to OR for hemoperitoneum: evacuation of clot and cauterization of bleeding at 1 of the 2 anastomotic sites; abd remains open - will eventually need to return to OR for abd closure  Central access: R internal jugular placed 1/16 in OR; 3x lumen HD cath placed in femoral vein 1/18 TPN start date: 1/17 (not given 1/17)  Nutritional Goals: RD Assessment: pending Estimated Needs Total Energy Estimated Needs: 1500-1750 kcals Total Protein Estimated Needs: 80-95 grams Total Fluid Estimated Needs: >/= 1.5L  Current Nutrition:  NPO, Clinimix 8/10 at 40 ml/hr - no longer on propofol  Due to the Baxter IV fluid disruption, all adult parenteral nutrition will utilize premade Clinimix products +/- fat emulsion infusion. Our goal will be to continue providing as close to 100% of our patient's nutritional needs, but due to limited TPN Clinimix concentrations we will attempt to meet at least 80% of protein and 75% of kcal needs.   Clinimix 8/10 at 50 ml/hr (1200 ml) + IV lipids (250 ml) + D10 at 50 ml/hr (1200 ml = 120g Dex = ~400 kcal) provides 96 g protein and ~1700 kcal daily, meeting 100 of goals - Can transition to custom TPN once ready for addition of electrolytes   Plan:  KCl 10 mEq  IV x 3 Ca repleted by MD this AM Advance plain (no lytes) Clinimix 8/10 to 50 mL/hr at 1800 today Doesn't appear to be refeeding significantly, but would like to recheck TG on Monday before adding IV lipids Start D10 at 50 ml/hr to provide additional kcal from  dextrose to meet nutritional goals Add standard MVI to TPN Trace elements and chromium currently on hold d/t national shortage Per RD recommendations, will add thiamine 100 mg IV x 5 days for refeeding risk Continue very sensitive scale SSI with q8 hr CBG checks and adjust as needed  MVIF per MD (D10 as part of TPN above) Monitor TPN labs on Mon/Thurs  Bernadene Person, PharmD, BCPS [phone] 08/12/2023, 10:03 AM

## 2023-08-12 NOTE — Progress Notes (Signed)
Sputum sample obtained and sent to lab at this time.

## 2023-08-12 NOTE — Progress Notes (Addendum)
eLink Physician-Brief Progress Note Patient Name: Teagan A. Waugh DOB: 09/24/1945 MRN: [id number]   Date of Service  08/12/2023  HPI/Events of Note    eICU Interventions     Went tback to OR today for wash out Significant blood loss over past 24 hrs Will keep flat and D/w surgery  Addendum Hypocalcemia 6.4 Albumin 2.6 Mag 1.8 Corrected ca:7.5  Will replace      Matylda Fehring 08/12/2023, 1:29 AM

## 2023-08-12 NOTE — Plan of Care (Signed)
  Problem: Clinical Measurements: Goal: Diagnostic test results will improve Outcome: Not Progressing Goal: Respiratory complications will improve Outcome: Not Progressing Goal: Cardiovascular complication will be avoided Outcome: Not Progressing   Problem: Activity: Goal: Risk for activity intolerance will decrease Outcome: Not Progressing   Problem: Nutrition: Goal: Adequate nutrition will be maintained Outcome: Not Progressing   Problem: Elimination: Goal: Will not experience complications related to bowel motility Outcome: Not Progressing

## 2023-08-12 NOTE — Progress Notes (Signed)
1 Day Post-Op   Subjective/Chief Complaint: Received a total over 2 pRBCs yesterday during the day but none overnight.  Hb has stabilized and vac output is SS.   Objective: Vital signs in last 24 hours: Temp:  [95.7 F (35.4 C)-100.8 F (38.2 C)] 99.1 F (37.3 C) (01/19 0700) Pulse Rate:  [50-79] 60 (01/19 0700) Resp:  [8-26] 25 (01/19 0700) BP: (123)/(48) 123/48 (01/18 1430) SpO2:  [90 %-100 %] 99 % (01/19 0700) Arterial Line BP: (55-208)/(29-75) 144/44 (01/19 0700) FiO2 (%):  [40 %-60 %] 40 % (01/19 0359) Weight:  [68.2 kg] 68.2 kg (01/19 0500) Last BM Date : 08/04/23  Intake/Output from previous day: 01/18 0701 - 01/19 0700 In: 6932.8 [I.V.:4679.8; Blood:1710.7; IV Piggyback:542.4] Out: 3355 [Urine:630; Emesis/NG output:750; Drains:475; Blood:1500] Intake/Output this shift: No intake/output data recorded.  PE:  Constitutional: No acute distress, conversant, appears states age. Eyes: Anicteric sclerae, moist conjunctiva, no lid lag Lungs: Clear to auscultation bilaterally, normal respiratory effort, intubated CV: regular rate and rhythm, no murmurs, no peripheral edema, pedal pulses 2+ GI: Soft, no masses or hepatosplenomegaly, non-tender to palpation, vac in place, SS output, ostomy pink and healthy with some liquid stool mixed with blood Skin: No rashes, palpation reveals normal turgor    Lab Results:  Recent Labs    08/11/23 1107 08/11/23 1839 08/12/23 0305  WBC 16.5*  --  23.7*  HGB 6.8* 8.6* 10.4*  HCT 19.4* 24.6* 30.4*  PLT 61*  --  63*   BMET Recent Labs    08/11/23 1107 08/12/23 0305  NA 138 137  K 3.8 3.6  CL 103 104  CO2 24 26  GLUCOSE 114* 145*  BUN 42* 51*  CREATININE 1.88* 2.20*  CALCIUM 6.0* 6.4*   PT/INR Recent Labs    08/10/23 1616 08/10/23 2302  LABPROT 22.9* 24.1*  INR 2.0* 2.1*   ABG Recent Labs    08/10/23 1600 08/10/23 1803  PHART 7.29* 7.28*  HCO3 20.2 15.2*    Studies/Results: DG CHEST PORT 1 VIEW Result Date:  08/10/2023 CLINICAL DATA:  Hypoxia EXAM: PORTABLE CHEST 1 VIEW COMPARISON:  08/09/2023 FINDINGS: Endotracheal tube, NG tube and right central line remain in place, unchanged. Linear opacities in the lung bases compatible with atelectasis. No effusions or pneumothorax. Heart and mediastinal contours within normal limits. No acute bony abnormality. IMPRESSION: Bibasilar atelectasis. No active disease. Electronically Signed   By: Charlett Nose M.D.   On: 08/10/2023 17:24   [place] EKG SITE RITE Result Date: 08/10/2023 If Site Rite image not attached, placement could not be confirmed due to current cardiac rhythm.   Anti-infectives: Anti-infectives (From admission, onward)    Start     Dose/Rate Route Frequency Ordered Stop   08/11/23 0000  piperacillin-tazobactam (ZOSYN) IVPB 2.25 g        2.25 g 100 mL/hr over 30 Minutes Intravenous Every 6 hours 08/10/23 1712     08/09/23 1545  piperacillin-tazobactam (ZOSYN) IVPB 3.375 g  Status:  Discontinued        3.375 g 12.5 mL/hr over 240 Minutes Intravenous Every 8 hours 08/09/23 1456 08/10/23 1712   08/09/23 0630  cefTRIAXone (ROCEPHIN) 2 g in sodium chloride 0.9 % 100 mL IVPB        2 g 200 mL/hr over 30 Minutes Intravenous On call 08/09/23 0614 08/09/23 1900       Assessment/Plan: 78 y/o F POD 3 ex-lap, LOA, R hemicolectomy, SBR x 3, POD 2 from SBR and ileostomy creation, and POD 1  washout and hematoma evacuation   - She remains critically ill but hemoglobin has stabilized and output from the vac is SS.  No plans for OR today - NPO with NGT to LWS.  AROBF - Continue abx - Tentative plan for OR tomorrow for possible abdominal closure. Timing TBD.    LOS: 6 days    Brooke Sanders 08/12/2023

## 2023-08-12 NOTE — Progress Notes (Signed)
NAME:  Brooke Sanders, MRN:  [id number], DOB:  Jun 03, 1946, LOS: 6 ADMISSION DATE:  08/06/2023, CONSULTATION DATE:  1/16 REFERRING MD:  Dr. Tedra Senegal, CHIEF COMPLAINT:  SBO   History of Present Illness:  78 year old female with PMH as below, which is significant for remote SBO, HTN, HLD, hypothyroid, and sarcoidosis. She presented to [facility] 1/13 with complaints of nausea, vomiting, and abdominal pain x 3 days. Onset was 1/10 about 3 hours after eating a meal. She assumed this was due to food poisoning. Symptoms progressed to include nausea/vomiting and diarrhea. She then began to no longer have bowel movements or pass gas. She was unable to keep any PO intake including medications down. When symptoms did not resolve she presented to [facility] ED. CT scan showed evidence of small bowel obstruction.  General surgery was consulted and NGT was placed for decompression. Despite conservative management she did not improve and the decision was made to proceed to the OR. 1/16 she underwent ex lap with lysis of adhesions, small bowel resection with primary anastomosis x 3, and right colectomy. The terminal ileum and the abdomen were left in discontinuity and she was transferred to the ICU on the ventilator. PCCM was asked to evaluate for ICU care.   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   Significant Hospital Events: Including procedures, antibiotic start and stop dates in addition to other pertinent events   1/13 admit for SBO with conservative management To OR for adhesion lysis, colectomy, and small bowel resection. Post op on vent with open abd.  1/17 returned to OR 1/18 returned to OR for wash out  Interim History / Subjective:   No plan for OR today She remains on levophed Sedated on fentanyl drip  Tmax 100.8 yesterday  Objective   Blood pressure (!) 123/48, pulse 61, temperature 99 F (37.2 C), resp. rate (!) 25, height 5' (1.524 m), weight 68.2 kg, SpO2 99%.    Vent Mode: PRVC FiO2 (%):  [40 %-60 %] 40 % Set Rate:  [25 bmp] 25 bmp Vt Set:  [360 mL] 360 mL PEEP:  [5 cmH20] 5 cmH20 Plateau Pressure:  [13 cmH20-15 cmH20] 14 cmH20   Intake/Output Summary (Last 24 hours) at 08/12/2023 0856 Last data filed at 08/12/2023 0800 Gross per 24 hour  Intake 4951.04 ml  Output 1855 ml  Net 3096.04 ml   Filed Weights   08/10/23 0500 08/11/23 0500 08/12/23 0500  Weight: 62 kg 68.2 kg 68.2 kg    Examination: General: Thin elderly female, intubated/sedated HENT: [place]/AT, no JVD Lungs: Clear bilateral breath sounds Cardiovascular: RRR, no MRG Abdomen: wound vac in place draining - serous fluid. soft Extremities: 1+ edema, warm Neuro: sedated, PERRL GU: Foley  Resolved Hospital Problem list     Assessment & Plan:   Shock Hypovolemic vs distributive - MAP goal 65 - continue vasopressor support  Respiratory insufficiency due to need for medical sedation.  - Full vent support - ABG stable this morning - VAP bundle - Propofol and fentanyl for  RASS goal -3 to -4 - CXR with patchy areas of interstitial prominence bilaterally, check tracheal aspirate. Currently on zosyn. Possibly volume overload. - SAT/SBT once done going to OR, will likely need diuresis as able given renal function  Acute Kidney Injury Hypophosphatemia Hypocalcemia - Monitor  Cr and UOP - avoid nephrtoxic agents - renal dose medications as needed - replete electrolytes  Small bowel obstruction: failed conservative management, taken to OR 1/16 for adhesion lysis, colectomy, and small bowel resection. Postoperatively she was brought to the ICU for recovery with bowel in discontinuity and open abdomen with plans for return to OR 1/17 - Management per surgery - NGT, NPO - Pain management with fentanyl infusion - Monitor wound vac output.  - TPN  - on zosyn for inra-abdominal coverage  Acute Blood Loss Anemia Thrombocytopenia - transfuse for hemoglobin to maintain hemoglobin 7g/dL or greater  SVT: 9/62-95 two separate episodes, resolved spontaneously - telemetry monitoring  Sarcoidosis: biopsy proven - on no chronic therapy, monitor.   HTN - holding home hydrochlorothiazide  Hypothyroid - Holding home therapy while NPO - may need to start IV replacement.  Restless legs - holding home pregabalin   Best Practice (right click and "Reselect all SmartList Selections" daily)   Diet/type: NPO, tpn DVT prophylaxis LMWH Pressure ulcer(s): N/A GI prophylaxis: PPI Lines: Central line Foley:  Yes, and it is still needed Code Status:  full code Last date of multidisciplinary goals of care discussion [ ]   Labs   CBC: Recent Labs  Lab 08/09/23 1539 08/10/23 0409 08/10/23 1535 08/10/23 2024 08/11/23 0119 08/11/23 1107 08/11/23 1839 08/12/23 0305  WBC 4.3 30.9* 30.0* 36.2* 21.0* 16.5*  --  23.7*  NEUTROABS 3.2 26.9*  --   --  18.2* 14.3*  --  21.7*  HGB 14.2 12.4 9.8* 10.8* 4.9* 6.8* 8.6* 10.4*  HCT 45.6 39.5 31.3* 33.8* 14.9* 19.4* 24.6* 30.4*  MCV 97.9 98.3 99.4 96.6 94.9 88.2  --  89.7  PLT 244 148* 156 110* 74* 61*  --  63*    Basic Metabolic Panel: Recent Labs  Lab 08/09/23 0515 08/09/23 1539 08/10/23 0409 08/10/23 2024 08/11/23 0119 08/11/23 1107 08/12/23 0305  NA 145 149* 144 136 138 138 137  K 3.8 3.4* 5.0 6.0* 4.7 3.8 3.6   CL 111 117* 114* 108 106 103 104  CO2 22 21* 22 10* 22 24 26   GLUCOSE 113* 115* 117* 174* 273* 114* 145*  BUN 34* 33* 41* 41* 45* 42* 51*  CREATININE 0.63 0.99 2.27* 2.67* 2.00* 1.88* 2.20*  CALCIUM 7.8* 7.2* 7.0* 6.1* 5.8* 6.0* 6.4*  MG 2.6* 2.1 1.9  --  1.7  --  1.8  PHOS 2.0* 4.3 2.4*  --  5.0*  --  4.3   GFR: Estimated Creatinine Clearance: 17.9 mL/min (A) (by C-G formula based on SCr of 2.2 mg/dL (H)). Recent Labs  Lab 08/06/23 1847 08/07/23 0517 08/09/23 1519 08/09/23 1539 08/10/23 2024 08/10/23 2302 08/11/23 0119 08/11/23 1107 08/12/23 0305  WBC 5.3   < >  --    < > 36.2*  --  21.0* 16.5* 23.7*  LATICACIDVEN 1.0  --  1.2  --  8.4* 5.6*  --   --   --    < > = values in this interval not displayed.    Liver Function Tests: Recent Labs  Lab 08/06/23 1054 08/06/23 1847 08/09/23 0515 08/09/23 1539 08/10/23 0409  AST 15 17 12* 19 19  ALT 12 15 11 13  11  ALKPHOS 38 37* 34* 37* 25*  BILITOT 1.1 0.9 0.6 0.6 0.5  PROT 6.9 6.6 5.9* 5.1* 4.7*  ALBUMIN 4.2 3.6 3.2* 3.2* 2.6*   Recent Labs  Lab 08/06/23 1054  LIPASE 15   No results for input(s): "AMMONIA" in the last 168 hours.  ABG    Component Value Date/Time   PHART 7.28 (L) 08/10/2023 1803   PCO2ART 32 08/10/2023 1803   PO2ART 381 (H) 08/10/2023 1803   HCO3 15.2 (L) 08/10/2023 1803   ACIDBASEDEF 10.6 (H) 08/10/2023 1803   O2SAT 100 08/10/2023 1803     Coagulation Profile: Recent Labs  Lab 08/10/23 1616 08/10/23 2302  INR 2.0* 2.1*    Cardiac Enzymes: No results for input(s): "CKTOTAL", "CKMB", "CKMBINDEX", "TROPONINI" in the last 168 hours.  HbA1C: Hemoglobin A1C  Date/Time Value Ref Range Status  12/12/2022 10:36 AM 5.0 4.0 - 5.6 % Final    CBG: Recent Labs  Lab 08/11/23 1532 08/11/23 2030 08/12/23 0039 08/12/23 0333 08/12/23 0758  GLUCAP 90 109* 157* 134* 122*      Critical care time: 40 minutes     Melody Comas, MD [facility] Office:  [phone]   See Amion for personal pager PCCM on call pager [phone] until 7pm. Please call Elink 7p-7a. [phone]

## 2023-08-12 NOTE — Plan of Care (Signed)

## 2023-08-12 NOTE — Progress Notes (Signed)
Surgery team came by to assess patient. Was instructed to continue to lay patient flat due to open abdominal wound.    Brooke Sanders

## 2023-08-13 ENCOUNTER — Inpatient Hospital Stay (HOSPITAL_COMMUNITY): Payer: Medicare Other | Admitting: Certified Registered Nurse Anesthetist

## 2023-08-13 ENCOUNTER — Encounter (HOSPITAL_COMMUNITY): Admission: EM | Disposition: E | Payer: Self-pay | Source: Ambulatory Visit | Attending: Pulmonary Disease

## 2023-08-13 ENCOUNTER — Encounter (HOSPITAL_COMMUNITY): Payer: Self-pay | Admitting: General Surgery

## 2023-08-13 ENCOUNTER — Other Ambulatory Visit: Payer: Self-pay

## 2023-08-13 DIAGNOSIS — E7800 Pure hypercholesterolemia, unspecified: Secondary | ICD-10-CM | POA: Diagnosis not present

## 2023-08-13 DIAGNOSIS — I2510 Atherosclerotic heart disease of native coronary artery without angina pectoris: Secondary | ICD-10-CM

## 2023-08-13 DIAGNOSIS — K661 Hemoperitoneum: Secondary | ICD-10-CM | POA: Diagnosis not present

## 2023-08-13 DIAGNOSIS — N179 Acute kidney failure, unspecified: Secondary | ICD-10-CM | POA: Diagnosis not present

## 2023-08-13 DIAGNOSIS — K9189 Other postprocedural complications and disorders of digestive system: Secondary | ICD-10-CM | POA: Diagnosis not present

## 2023-08-13 DIAGNOSIS — I471 Supraventricular tachycardia, unspecified: Secondary | ICD-10-CM

## 2023-08-13 DIAGNOSIS — R579 Shock, unspecified: Secondary | ICD-10-CM | POA: Diagnosis not present

## 2023-08-13 DIAGNOSIS — R7401 Elevation of levels of liver transaminase levels: Secondary | ICD-10-CM | POA: Diagnosis not present

## 2023-08-13 DIAGNOSIS — K56609 Unspecified intestinal obstruction, unspecified as to partial versus complete obstruction: Secondary | ICD-10-CM | POA: Diagnosis not present

## 2023-08-13 DIAGNOSIS — I10 Essential (primary) hypertension: Secondary | ICD-10-CM

## 2023-08-13 HISTORY — PX: LAPAROTOMY: SHX154

## 2023-08-13 LAB — TYPE AND SCREEN
ABO/RH(D): A POS
Antibody Screen: NEGATIVE
Unit division: 0
Unit division: 0
Unit division: 0
Unit division: 0
Unit division: 0
Unit division: 0

## 2023-08-13 LAB — BPAM RBC
Blood Product Expiration Date: 202502112359
Blood Product Expiration Date: 202502112359
Blood Product Expiration Date: 202502112359
Blood Product Expiration Date: 202502112359
Blood Product Expiration Date: 202502172359
Blood Product Expiration Date: 202502182359
ISSUE DATE / TIME: 202501171603
ISSUE DATE / TIME: 202501171603
ISSUE DATE / TIME: 202501180521
ISSUE DATE / TIME: 202501180623
ISSUE DATE / TIME: 202501181419
ISSUE DATE / TIME: 202501181528
Unit Type and Rh: 6200
Unit Type and Rh: 6200
Unit Type and Rh: 6200
Unit Type and Rh: 6200
Unit Type and Rh: 9500
Unit Type and Rh: 9500

## 2023-08-13 LAB — PREPARE FRESH FROZEN PLASMA
Unit division: 0
Unit division: 0

## 2023-08-13 LAB — CBC WITH DIFFERENTIAL/PLATELET
Abs Immature Granulocytes: 0.74 10*3/uL — ABNORMAL HIGH (ref 0.00–0.07)
Basophils Absolute: 0.1 10*3/uL (ref 0.0–0.1)
Basophils Relative: 1 %
Eosinophils Absolute: 0 10*3/uL (ref 0.0–0.5)
Eosinophils Relative: 0 %
HCT: 27.8 % — ABNORMAL LOW (ref 36.0–46.0)
Hemoglobin: 9.2 g/dL — ABNORMAL LOW (ref 12.0–15.0)
Immature Granulocytes: 4 %
Lymphocytes Relative: 4 %
Lymphs Abs: 0.7 10*3/uL (ref 0.7–4.0)
MCH: 30.7 pg (ref 26.0–34.0)
MCHC: 33.1 g/dL (ref 30.0–36.0)
MCV: 92.7 fL (ref 80.0–100.0)
Monocytes Absolute: 0.4 10*3/uL (ref 0.1–1.0)
Monocytes Relative: 2 %
Neutro Abs: 16.1 10*3/uL — ABNORMAL HIGH (ref 1.7–7.7)
Neutrophils Relative %: 89 %
Platelets: 47 10*3/uL — ABNORMAL LOW (ref 150–400)
RBC: 3 MIL/uL — ABNORMAL LOW (ref 3.87–5.11)
RDW: 15.3 % (ref 11.5–15.5)
WBC: 18.1 10*3/uL — ABNORMAL HIGH (ref 4.0–10.5)
nRBC: 0.1 % (ref 0.0–0.2)

## 2023-08-13 LAB — BPAM PLATELET PHERESIS
Blood Product Expiration Date: 202501202359
Blood Product Expiration Date: 202501212359
ISSUE DATE / TIME: 202501180742
Unit Type and Rh: 5100
Unit Type and Rh: 600

## 2023-08-13 LAB — PREPARE PLATELET PHERESIS
Unit division: 0
Unit division: 0

## 2023-08-13 LAB — GLUCOSE, CAPILLARY
Glucose-Capillary: 115 mg/dL — ABNORMAL HIGH (ref 70–99)
Glucose-Capillary: 120 mg/dL — ABNORMAL HIGH (ref 70–99)
Glucose-Capillary: 122 mg/dL — ABNORMAL HIGH (ref 70–99)
Glucose-Capillary: 136 mg/dL — ABNORMAL HIGH (ref 70–99)
Glucose-Capillary: 138 mg/dL — ABNORMAL HIGH (ref 70–99)
Glucose-Capillary: 144 mg/dL — ABNORMAL HIGH (ref 70–99)
Glucose-Capillary: 175 mg/dL — ABNORMAL HIGH (ref 70–99)

## 2023-08-13 LAB — COMPREHENSIVE METABOLIC PANEL
ALT: 523 U/L — ABNORMAL HIGH (ref 0–44)
AST: 383 U/L — ABNORMAL HIGH (ref 15–41)
Albumin: <1.5 g/dL — ABNORMAL LOW (ref 3.5–5.0)
Alkaline Phosphatase: 60 U/L (ref 38–126)
Anion gap: 8 (ref 5–15)
BUN: 63 mg/dL — ABNORMAL HIGH (ref 8–23)
CO2: 27 mmol/L (ref 22–32)
Calcium: 6.7 mg/dL — ABNORMAL LOW (ref 8.9–10.3)
Chloride: 107 mmol/L (ref 98–111)
Creatinine, Ser: 2.1 mg/dL — ABNORMAL HIGH (ref 0.44–1.00)
GFR, Estimated: 24 mL/min — ABNORMAL LOW (ref >60)
Glucose, Bld: 157 mg/dL — ABNORMAL HIGH (ref 70–99)
Potassium: 3.5 mmol/L (ref 3.5–5.1)
Sodium: 142 mmol/L (ref 135–145)
Total Bilirubin: 0.6 mg/dL (ref 0.0–1.2)
Total Protein: 3.7 g/dL — ABNORMAL LOW (ref 6.5–8.1)

## 2023-08-13 LAB — BPAM FFP
Blood Product Expiration Date: 202501222359
Blood Product Expiration Date: 202501222359
Blood Product Expiration Date: 202501232359
Blood Product Expiration Date: 202501232359
ISSUE DATE / TIME: 202501171938
ISSUE DATE / TIME: 202501172050
ISSUE DATE / TIME: 202501180338
ISSUE DATE / TIME: 202501180433
Unit Type and Rh: 6200
Unit Type and Rh: 6200
Unit Type and Rh: 6200
Unit Type and Rh: 6200

## 2023-08-13 LAB — MAGNESIUM: Magnesium: 2 mg/dL (ref 1.7–2.4)

## 2023-08-13 LAB — SURGICAL PATHOLOGY

## 2023-08-13 LAB — TRIGLYCERIDES: Triglycerides: 179 mg/dL — ABNORMAL HIGH (ref <150)

## 2023-08-13 LAB — PHOSPHORUS: Phosphorus: 2.5 mg/dL (ref 2.5–4.6)

## 2023-08-13 SURGERY — LAPAROTOMY, EXPLORATORY
Anesthesia: General

## 2023-08-13 MED ORDER — LEVOTHYROXINE SODIUM 100 MCG/5ML IV SOLN: 60.0000 ug | Freq: Every day | INTRAVENOUS | Status: DC

## 2023-08-13 MED ORDER — POTASSIUM CHLORIDE 20 MEQ PO PACK: 20.0000 meq | PACK | Freq: Two times a day (BID) | ORAL | Status: DC

## 2023-08-13 MED ORDER — CALCIUM GLUCONATE-NACL 1-0.675 GM/50ML-% IV SOLN
1.0000 g | Freq: Once | INTRAVENOUS | Status: AC
  Administered 2023-08-13: 1000 mg via INTRAVENOUS
  Filled 2023-08-13: qty 50

## 2023-08-13 MED ORDER — POTASSIUM PHOSPHATES 15 MMOLE/5ML IV SOLN
30.0000 mmol | Freq: Once | INTRAVENOUS | Status: DC
  Filled 2023-08-13: qty 10

## 2023-08-13 MED ORDER — METOPROLOL TARTRATE 5 MG/5ML IV SOLN
5.0000 mg | Freq: Four times a day (QID) | INTRAVENOUS | Status: DC
  Administered 2023-08-13: 5 mg via INTRAVENOUS
  Filled 2023-08-13: qty 5

## 2023-08-13 MED ORDER — PROPOFOL 10 MG/ML IV BOLUS
INTRAVENOUS | Status: DC | PRN
  Administered 2023-08-13: 50 mg via INTRAVENOUS

## 2023-08-13 MED ORDER — POTASSIUM CHLORIDE 10 MEQ/100ML IV SOLN: 10.0000 meq | INTRAVENOUS | Status: DC

## 2023-08-13 MED ORDER — PROPOFOL 10 MG/ML IV BOLUS
INTRAVENOUS | Status: AC
  Filled 2023-08-13: qty 20

## 2023-08-13 MED ORDER — LACTATED RINGERS IV SOLN: INTRAVENOUS | Status: DC | PRN

## 2023-08-13 MED ORDER — FENTANYL CITRATE (PF) 100 MCG/2ML IJ SOLN: INTRAMUSCULAR | Status: DC | PRN

## 2023-08-13 MED ORDER — TRACE MINERALS CU-MN-SE-ZN 300-55-60-3000 MCG/ML IV SOLN
INTRAVENOUS | Status: AC
  Filled 2023-08-13: qty 1200

## 2023-08-13 MED ORDER — POTASSIUM PHOSPHATES 15 MMOLE/5ML IV SOLN
15.0000 mmol | Freq: Once | INTRAVENOUS | Status: AC
  Administered 2023-08-13: 15 mmol via INTRAVENOUS
  Filled 2023-08-13: qty 5

## 2023-08-13 MED ORDER — FENTANYL CITRATE (PF) 100 MCG/2ML IJ SOLN
INTRAMUSCULAR | Status: DC | PRN
  Administered 2023-08-13 (×2): 50 ug via INTRAVENOUS

## 2023-08-13 MED ORDER — 0.9 % SODIUM CHLORIDE (POUR BTL) OPTIME
TOPICAL | Status: DC | PRN
  Administered 2023-08-13: 3000 mL

## 2023-08-13 MED ORDER — HYDRALAZINE HCL 20 MG/ML IJ SOLN: 10.0000 mg | INTRAMUSCULAR | Status: DC | PRN

## 2023-08-13 MED ORDER — FENTANYL CITRATE (PF) 100 MCG/2ML IJ SOLN
INTRAMUSCULAR | Status: AC
  Filled 2023-08-13: qty 2

## 2023-08-13 MED ORDER — ROCURONIUM BROMIDE 10 MG/ML (PF) SYRINGE
PREFILLED_SYRINGE | INTRAVENOUS | Status: DC | PRN
  Administered 2023-08-13: 30 mg via INTRAVENOUS
  Administered 2023-08-13: 50 mg via INTRAVENOUS

## 2023-08-13 SURGICAL SUPPLY — 45 items
APPLICATOR COTTON TIP 6 STRL (MISCELLANEOUS) ×1 IMPLANT
APPLICATOR COTTON TIP 6IN STRL (MISCELLANEOUS) ×1 IMPLANT
BAG COUNTER SPONGE SURGICOUNT (BAG) IMPLANT
BLADE EXTENDED COATED 6.5IN (ELECTRODE) IMPLANT
BLADE HEX COATED 2.75 (ELECTRODE) ×1 IMPLANT
COUNTER NDL 20CT MAGNET RED (NEEDLE) IMPLANT
COVER MAYO STAND STRL (DRAPES) IMPLANT
DRAIN CHANNEL 19F RND (DRAIN) IMPLANT
DRAPE LAPAROSCOPIC ABDOMINAL (DRAPES) ×1 IMPLANT
DRAPE WARM FLUID 44X44 (DRAPES) IMPLANT
ELECT REM PT RETURN 15FT ADLT (MISCELLANEOUS) ×1 IMPLANT
EVACUATOR SILICONE 100CC (DRAIN) IMPLANT
GAUZE PAD ABD 8X10 STRL (GAUZE/BANDAGES/DRESSINGS) IMPLANT
GAUZE SPONGE 4X4 12PLY STRL (GAUZE/BANDAGES/DRESSINGS) ×1 IMPLANT
GAUZE SPONGE 4X4 12PLY STRL LF (GAUZE/BANDAGES/DRESSINGS) IMPLANT
GLOVE BIOGEL PI IND STRL 7.0 (GLOVE) ×1 IMPLANT
GLOVE ECLIPSE 8.0 STRL XLNG CF (GLOVE) ×1 IMPLANT
GLOVE INDICATOR 8.0 STRL GRN (GLOVE) ×2 IMPLANT
GOWN STRL REUS W/ TWL XL LVL3 (GOWN DISPOSABLE) ×2 IMPLANT
HANDLE SUCTION POOLE (INSTRUMENTS) IMPLANT
KIT BASIN OR (CUSTOM PROCEDURE TRAY) ×1 IMPLANT
KIT TURNOVER KIT A (KITS) IMPLANT
LIGASURE IMPACT 36 18CM CVD LR (INSTRUMENTS) IMPLANT
NS IRRIG 1000ML POUR BTL (IV SOLUTION) ×1 IMPLANT
PACK GENERAL/GYN (CUSTOM PROCEDURE TRAY) ×1 IMPLANT
RELOAD PROXIMATE 75MM BLUE (ENDOMECHANICALS) ×6 IMPLANT
RELOAD PROXIMATE TA60MM BLUE (ENDOMECHANICALS) ×1 IMPLANT
RELOAD STAPLE 60 BLU REG PROX (ENDOMECHANICALS) IMPLANT
RELOAD STAPLE 75 3.8 BLU REG (ENDOMECHANICALS) IMPLANT
STAPLER GUN LINEAR PROX 60 (STAPLE) IMPLANT
STAPLER PROXIMATE 75MM BLUE (STAPLE) IMPLANT
STAPLER SKIN PROX WIDE 3.9 (STAPLE) ×1 IMPLANT
SUCTION POOLE HANDLE (INSTRUMENTS) IMPLANT
SUT ETHILON 2 0 PS N (SUTURE) IMPLANT
SUT PDS AB 1 TP1 96 (SUTURE) IMPLANT
SUT SILK 2-0 18XBRD TIE 12 (SUTURE) IMPLANT
SUT SILK 3 0 SH CR/8 (SUTURE) IMPLANT
SUT SILK 3-0 18XBRD TIE 12 (SUTURE) IMPLANT
SUT VIC AB 2-0 SH 18 (SUTURE) IMPLANT
SUT VIC AB 3-0 SH 18 (SUTURE) IMPLANT
SUT VIC AB 3-0 SH 27X BRD (SUTURE) IMPLANT
TAPE CLOTH SURG 4X10 WHT LF (GAUZE/BANDAGES/DRESSINGS) IMPLANT
TOWEL OR 17X26 10 PK STRL BLUE (TOWEL DISPOSABLE) ×2 IMPLANT
TRAY FOLEY MTR SLVR 16FR STAT (SET/KITS/TRAYS/PACK) IMPLANT
YANKAUER SUCT BULB TIP NO VENT (SUCTIONS) IMPLANT

## 2023-08-13 NOTE — H&P (View-Only) (Signed)
2 Days Post-Op   Subjective/Chief Complaint: No further blood transfusions since 1/18. Still on 1 mcg levophed. Drain output remains SS. Afebrile. Hgb stable  Son Wade at bedside Objective: Vital signs in last 24 hours: Temp:  [95.9 F (35.5 C)-99 F (37.2 C)] 99 F (37.2 C) (01/20 0800) Pulse Rate:  [43-70] 61 (01/20 0800) Resp:  [0-25] 25 (01/20 0800) SpO2:  [98 %-100 %] 99 % (01/20 0800) Arterial Line BP: (88-240)/(33-85) 138/43 (01/20 0745) FiO2 (%):  [30 %-35 %] 30 % (01/20 0800) Weight:  [70.4 kg] 70.4 kg (01/20 0400) Last BM Date : 08/13/23  Intake/Output from previous day: 01/19 0701 - 01/20 0700 In: 2448 [I.V.:2081.3; IV Piggyback:366.7] Out: 3450 [Urine:2325; Emesis/NG output:550; Drains:475; Stool:100] Intake/Output this shift: No intake/output data recorded.  PE:  Constitutional: No acute distress. On Vent Lungs: intubated CV: regular rate and rhythm, no murmurs, no peripheral edema GI: Soft, no masses or hepatosplenomegaly, vac in place, SS output, ostomy pink and healthy with some liquid stool mixed with blood Skin: No rashes, palpation reveals normal turgor    Lab Results:  Recent Labs    08/12/23 0305 08/13/23 0348  WBC 23.7* 18.1*  HGB 10.4* 9.2*  HCT 30.4* 27.8*  PLT 63* 47*   BMET Recent Labs    08/12/23 0305 08/13/23 0348  NA 137 142  K 3.6 3.5  CL 104 107  CO2 26 27  GLUCOSE 145* 157*  BUN 51* 63*  CREATININE 2.20* 2.10*  CALCIUM 6.4* 6.7*   PT/INR Recent Labs    08/10/23 1616 08/10/23 2302  LABPROT 22.9* 24.1*  INR 2.0* 2.1*   ABG Recent Labs    08/10/23 1600 08/10/23 1803  PHART 7.29* 7.28*  HCO3 20.2 15.2*    Studies/Results: DG CHEST PORT 1 VIEW Result Date: 08/12/2023 CLINICAL DATA:  78 year old female with history of respiratory failure. EXAM: PORTABLE CHEST 1 VIEW COMPARISON:  Chest x-ray 08/10/2023. FINDINGS: An endotracheal tube is in place with tip 4.7 cm above the carina. Nasogastric tube extends into the  body of the stomach with the side port just distal to the gastroesophageal junction. There is a right-sided internal jugular central venous catheter with tip terminating in the right atrium. Lung volumes are slightly low. Patchy areas of interstitial prominence and peribronchial cuffing as well as some patchy ill-defined opacities are noted throughout the lungs bilaterally, most evident throughout the left mid lung and right lung base, concerning for multilobar bilateral bronchopneumonia. No definite pleural effusions. No pneumothorax. No evidence of pulmonary edema. Heart size is normal. Upper mediastinal contours are within normal limits. Atherosclerotic calcifications are noted in the thoracic aorta. IMPRESSION: 1. Support apparatus, as above. 2. The appearance of the chest is concerning for multilobar bilateral bronchopneumonia, as above. 3. Aortic atherosclerosis. Electronically Signed   By: Trudie Reed M.D.   On: 08/12/2023 13:22    Anti-infectives: Anti-infectives (From admission, onward)    Start     Dose/Rate Route Frequency Ordered Stop   08/11/23 0000  piperacillin-tazobactam (ZOSYN) IVPB 2.25 g        2.25 g 100 mL/hr over 30 Minutes Intravenous Every 6 hours 08/10/23 1712     08/09/23 1545  piperacillin-tazobactam (ZOSYN) IVPB 3.375 g  Status:  Discontinued        3.375 g 12.5 mL/hr over 240 Minutes Intravenous Every 8 hours 08/09/23 1456 08/10/23 1712   08/09/23 0630  cefTRIAXone (ROCEPHIN) 2 g in sodium chloride 0.9 % 100 mL IVPB  2 g 200 mL/hr over 30 Minutes Intravenous On call 08/09/23 0614 08/09/23 1900       Assessment/Plan: 78 y/o F POD 4 ex-lap, LOA, R hemicolectomy, SBR x 3, POD 3 from SBR and ileostomy creation, and POD 2 washout and hematoma evacuation   - hgb overall stable without transfusion in last 24h. Vac output SS - NPO with NGT to LWS.  AROBF - having some liquid output - continue TPN - Continue abx  - plan for OR today for closure   LOS: 7  days   Eric Form, [facility] 08/13/2023, 8:29 AM Please see Amion for pager number during day hours 7:00am-4:30pm

## 2023-08-13 NOTE — Interval H&P Note (Signed)
History and Physical Interval Note:  08/13/2023 12:01 PM  Brooke Sanders  has presented today for surgery, with the diagnosis of OPEN ABDOMEN.  The various methods of treatment have been discussed with the patient and family. After consideration of risks, benefits and other options for treatment, the patient has consented to  Procedure(s): EXPLORATORY LAPAROTOMY, CLOSURE OF OPEN ABDOMEN (N/A) as a surgical intervention.  The patient's history has been reviewed, patient examined, no change in status, stable for surgery.  I have reviewed the patient's chart and labs.  Questions were answered to the patient's satisfaction.   The procedure has been discussed with the patient.  Alternative therapies have been discussed with the patient.  Operative risks include bleeding,  Infection,  Organ injury,  Nerve injury,  Blood vessel injury,  DVT,  Pulmonary embolism,  Death,  And possible reoperation.  Medical management risks include worsening of present situation.  The success of the procedure is 50 -90 % at treating patients symptoms.  The patient understands and agrees to proceed.   Azaan Leask A Zephaniah Enyeart

## 2023-08-13 NOTE — Anesthesia Preprocedure Evaluation (Addendum)
Anesthesia Evaluation  Patient identified by MRN, date of birth, ID band Patient unresponsive    Reviewed: Allergy & Precautions, NPO status , Patient's Chart, lab work & pertinent test results  Airway Mallampati: Intubated       Dental   Pulmonary  Intubated      + intubated    Cardiovascular hypertension, Pt. on home beta blockers + CAD   Rhythm:Regular Rate:Tachycardia     Neuro/Psych Sedated  Neuromuscular disease CVA  negative psych ROS   GI/Hepatic ,,,(+) Hepatitis -OPEN ABDOMEN   Endo/Other  Hypothyroidism    Renal/GU negative Renal ROS     Musculoskeletal  (+) Arthritis ,  Fibromyalgia -  Abdominal   Peds  Hematology negative hematology ROS (+)   Anesthesia Other Findings  POD 4 ex-lap, LOA, R hemicolectomy, SBR x 3, POD 3 from SBR and ileostomy creation, and POD 2 washout and hematoma evacuation   Reproductive/Obstetrics                              Anesthesia Physical Anesthesia Plan  ASA: 4  Anesthesia Plan: General   Post-op Pain Management:    Induction:   PONV Risk Score and Plan: 3 and Treatment may vary due to age or medical condition  Airway Management Planned: Oral ETT  Additional Equipment:   Intra-op Plan:   Post-operative Plan: Post-operative intubation/ventilation  Informed Consent: I have reviewed the patients History and Physical, chart, labs and discussed the procedure including the risks, benefits and alternatives for the proposed anesthesia with the patient or authorized representative who has indicated his/her understanding and acceptance.       Plan Discussed with: CRNA and Surgeon  Anesthesia Plan Comments: (For abdominal closure  On levophed )        Anesthesia Quick Evaluation

## 2023-08-13 NOTE — Op Note (Signed)
Preoperative diagnosis: History of small bowel obstruction with multiple laparotomies and small bowel resections and open abdomen  Postoperative diagnosis: Same  Procedure: Exploratory laparotomy with resection of both small bowel anastomoses and creation of new small bowel anastomosis and closure of abdomen with drain placement  Surgeon: Donnie Aho, MD  Assistant: Bailey Mech PA-C  Anesthesia: General  EBL: 100 cc  Specimen both anastomoses were resected due to ischemic changes in impending leak.  Both were sent to pathology  Indications for procedure: The patient is a 78 year old female who had a small bowel obstruction status post explore laparotomy last week Dr. Dwain Sarna.  She underwent small bowel resections and creation of end ileostomy.  Initially, she had a perforated terminal ileum and small bowel obstruction secondary adhesions.  She underwent a laparotomy with extensive lysis of adhesions, small bowel resection with anastomosis and the there initially 3 of these and a right colectomy.  Her abdomen was left open.  She was reexplored the next day where she had an additional small bowel resection and creation of end ileostomy.  The following day, she returned to the OR due to hemoperitoneum and bleeding from one of the staple lines.  She has remained stable and comes in today for exploration with consideration of closure.  She is stabilized and is off pressors.  Informed consent was obtained from the family and the procedure was discussed with them.The procedure has been discussed with the patient's family.  Alternative therapies have been discussed with the patient.  Operative risks include bleeding,  Infection,  Organ injury,  Nerve injury,  Blood vessel injury,  DVT,  Pulmonary embolism, further bowel resection, anastomotic leak, death,  And possible reoperation.  Medical management risks include worsening of present situation.  The success of the procedure is 50 -90 % at treating  patients condition.  The family understands and agrees to proceed.    Description of procedure: The patient was taken from the ICU straight back to the operating.  She was placed supine and general anesthesia initiated.  Previous ABThera dressing removed.  Abdomen and ostomy prepped and draped in a sterile fashion timeout performed.  The inner sponge was removed.  The small bowel was eviscerated.  From the ligament of Treitz there is anastomosis at about 20 to 30 cm away.  This showed ischemic changes along the staple line.  I then ran the small bowel to the second anastomosis which showed similar ischemic changes along the staple line on the antimesenteric border.  There is also some leakage of bile from this 1.  The bowel was then run to the ileostomy.  This measured about 150 cm.  Both anastomosis look like they were on the verge of leaking and/or beginning to leak.  I opted to resect both of these and she is hemodynamically stable.  The proximal colon was taken down with 2 GIA 75 stapler's.  LigaSure used for the mesentery.  A side-to-side functional end-to-end anastomosis was created with a GIA 75 stapler.  A TX 60 was used and this was fired the tissues fell apart.  The edges were debrided and had brisk blood flow.  There were quite edematous and friable.  I opted to close the common enterotomy in 2 layers with 3-0 Vicryl as a running suture in a 3-0 silk interrupted.  A crotch stitch was placed at the anastomosis.  This appeared to be viable with no signs of ecchymoses or ischemia.  The tissue quality was poor.  There is no  kinking in the anastomosis was open.  Attention was then turned to the second anastomosis.  There was trace leakage of bile along an ischemic staple line.  In a similar fashion the anastomosis was resected using a GIA 75 stapler x 2.  A functional side-to-side anastomosis was created using a GIA 75 stapler for the common channel and a TX 60 was used to close the common enterotomy.  This  tissue held the staple line much better than the more proximal end.  I oversewed the staple line with interrupted 3-0 silk sutures.  The anastomosis laid nicely with no evidence of leak and upon examination.  I reexamined the second anastomosis again it appeared viable with no signs of ischemia or leakage.  Contents were milked back and forth with both anastomoses without any signs of leakage.  There is no undue tension on either anastomosis nor was there any significant mesenteric defect.  There is no signs of any bleeding.  Irrigation of the abdominal cavity was performed.  Reexamination showed a long Hartman's pouch.  There were no other gross abnormalities noted.  The fascia was closed with double-stranded PDS.  The skin was packed open.  Ostomy appliance applied.  All counts were found to be correct.  The patient was then taken back to the ICU in critical but stable condition.

## 2023-08-13 NOTE — Progress Notes (Signed)
PHARMACY - TOTAL PARENTERAL NUTRITION CONSULT NOTE   Indication: massive bowel resection  Patient Measurements: Height: 5' (152.4 cm) Weight: 70.4 kg (155 lb 3.3 oz) IBW/kg (Calculated) : 45.5 TPN AdjBW (KG): 49.3 Body mass index is 30.31 kg/m. Usual Weight: ~55 kg in November (weight has actually increased  Assessment:  24 yoF admitted with n/v, abdominal pain starting 1/10. Failed conservative management, and required exploratory laparotomy on 1/16. Surgery revealed ischemia and necrosis of the terminal ileum/cecum, requiring resection of this as well as the ascending colon. Bowel was left in discontinuity for now. Pharmacy consulted to start TPN on 1/17 d/t anticipated prolonged NPO status.  Significant events: 1/17: TPN mixed and delivered to ICU, but not given d/t limited access and need for multiple critical medications (pressors, incompatible fluids, etc). Also noted to have significant bleeding from open abdomen throughout the night, requiring multiple units of PRBC and IVF resuscitation  Glucose: No Hx DM - goal 100-150 - CBGs well controlled w/ minimal SSI (2 units yesterday) - note current TPN is especially low in dextrose - D10 infusion running at 78mL/hr Electrolytes: K WNL but borderline low today; Ca remains low but somewhat improved after repletion yesterday; Phos now WNL and actually at lower end of therapeutic range; Na, Mg, Cl, bicarb all WNL Renal: AKI slightly improved today with Scr 2.10; BUN continues to trend up; UOP improved from yesterday Hepatic: (1/17) Tbili WNL; albumin low at <1.5 - LFTs increased 19/11 >> 383/523 from 1/17 - TG elevated 152 >> 179 from 1/17 (propofol now off) I/O: received about 4L in blood products/FFP over the past 3 days but has been stable since 1/18 evening - NG output slightly decreased (~550 mL) - UOP significantly increased (~2.3 L) GI Imaging: - 1/13 CT a/p: SBO with multiple transition points, possible ischemia; biliary duct  dilatation - 1/13 and 1/15 AXRs show worsening SBO GI Surgeries / Procedures:  - 1/16 ex lap: resection x 3 of terminal ileum and R hemicolectomy; abdomen left open & bowel discontinuous - 1/17 return to OR for second look: further resection of unviable small bowel; creation of new anastomosis and end ileostomy (approx 150 cm SB remaining); abd remains open - 1/18 emergent return to OR for hemoperitoneum: evacuation of clot and cauterization of bleeding at 1 of the 2 anastomotic sites; abd remains open - 1/20 planning return to OR for exploratory laparotomy/closure of abdomen  Central access: R internal jugular placed 1/16 in OR; 3x lumen HD cath placed in femoral vein 1/18 TPN start date: 1/17 (not given 1/17)  Nutritional Goals: RD Assessment: pending Estimated Needs Total Energy Estimated Needs: 1500-1750 kcals Total Protein Estimated Needs: 80-95 grams Total Fluid Estimated Needs: >/= 1.5L  Current Nutrition:  NPO, Clinimix 8/10 at 40 ml/hr - no longer on propofol  Due to the Baxter IV fluid disruption, all adult parenteral nutrition will utilize premade Clinimix products +/- fat emulsion infusion. Our goal will be to continue providing as close to 100% of our patient's nutritional needs, but due to limited TPN Clinimix concentrations we will attempt to meet at least 80% of protein and 75% of kcal needs.   Clinimix 8/10 at 50 ml/hr (1200 ml) + IV lipids (250 ml) + D10 at 50 ml/hr (1200 ml = 120g Dex = ~400 kcal) provides 96 g protein and ~1700 kcal daily, meeting 100 of goals - Can transition to custom TPN once ready for addition of electrolytes   Plan:  Now Kphos 15 mmol IV x1 Ca  repleted by MD this AM  At 1800 today: Continue plain (no lytes) Clinimix 8/10 at 50 mL/hr Given LFTs elevated to 383/523 and TGs slightly elevated at 179, will hold off on adding IV lipids today.  Continue D10 at 50 ml/hr to provide additional kcal from dextrose to meet nutritional goals Add  standard MVI to TPN Chromium currently on hold d/t national shortage Per RD recommendations, will add thiamine 100 mg IV x 5 days for refeeding risk Continue very sensitive scale SSI and adjust to q6hr CBG checks (q4hr checks currently ordered) MVIF per MD (D10 as part of TPN above) Monitor TPN labs on Mon/Thurs and PRN   Cherylin Mylar, PharmD Clinical Pharmacist  1/20/202510:57 AM

## 2023-08-13 NOTE — Plan of Care (Signed)
  Problem: Clinical Measurements: Goal: Diagnostic test results will improve Outcome: Not Progressing Goal: Respiratory complications will improve Outcome: Not Progressing Goal: Cardiovascular complication will be avoided Outcome: Not Progressing   Problem: Activity: Goal: Risk for activity intolerance will decrease Outcome: Not Progressing   Problem: Elimination: Goal: Will not experience complications related to bowel motility Outcome: Not Progressing

## 2023-08-13 NOTE — Progress Notes (Signed)
2 Days Post-Op   Subjective/Chief Complaint: No further blood transfusions since 1/18. Still on 1 mcg levophed. Drain output remains SS. Afebrile. Hgb stable  Son Wade at bedside Objective: Vital signs in last 24 hours: Temp:  [95.9 F (35.5 C)-99 F (37.2 C)] 99 F (37.2 C) (01/20 0800) Pulse Rate:  [43-70] 61 (01/20 0800) Resp:  [0-25] 25 (01/20 0800) SpO2:  [98 %-100 %] 99 % (01/20 0800) Arterial Line BP: (88-240)/(33-85) 138/43 (01/20 0745) FiO2 (%):  [30 %-35 %] 30 % (01/20 0800) Weight:  [70.4 kg] 70.4 kg (01/20 0400) Last BM Date : 08/13/23  Intake/Output from previous day: 01/19 0701 - 01/20 0700 In: 2448 [I.V.:2081.3; IV Piggyback:366.7] Out: 3450 [Urine:2325; Emesis/NG output:550; Drains:475; Stool:100] Intake/Output this shift: No intake/output data recorded.  PE:  Constitutional: No acute distress. On Vent Lungs: intubated CV: regular rate and rhythm, no murmurs, no peripheral edema GI: Soft, no masses or hepatosplenomegaly, vac in place, SS output, ostomy pink and healthy with some liquid stool mixed with blood Skin: No rashes, palpation reveals normal turgor    Lab Results:  Recent Labs    08/12/23 0305 08/13/23 0348  WBC 23.7* 18.1*  HGB 10.4* 9.2*  HCT 30.4* 27.8*  PLT 63* 47*   BMET Recent Labs    08/12/23 0305 08/13/23 0348  NA 137 142  K 3.6 3.5  CL 104 107  CO2 26 27  GLUCOSE 145* 157*  BUN 51* 63*  CREATININE 2.20* 2.10*  CALCIUM 6.4* 6.7*   PT/INR Recent Labs    08/10/23 1616 08/10/23 2302  LABPROT 22.9* 24.1*  INR 2.0* 2.1*   ABG Recent Labs    08/10/23 1600 08/10/23 1803  PHART 7.29* 7.28*  HCO3 20.2 15.2*    Studies/Results: DG CHEST PORT 1 VIEW Result Date: 08/12/2023 CLINICAL DATA:  78 year old female with history of respiratory failure. EXAM: PORTABLE CHEST 1 VIEW COMPARISON:  Chest x-ray 08/10/2023. FINDINGS: An endotracheal tube is in place with tip 4.7 cm above the carina. Nasogastric tube extends into the  body of the stomach with the side port just distal to the gastroesophageal junction. There is a right-sided internal jugular central venous catheter with tip terminating in the right atrium. Lung volumes are slightly low. Patchy areas of interstitial prominence and peribronchial cuffing as well as some patchy ill-defined opacities are noted throughout the lungs bilaterally, most evident throughout the left mid lung and right lung base, concerning for multilobar bilateral bronchopneumonia. No definite pleural effusions. No pneumothorax. No evidence of pulmonary edema. Heart size is normal. Upper mediastinal contours are within normal limits. Atherosclerotic calcifications are noted in the thoracic aorta. IMPRESSION: 1. Support apparatus, as above. 2. The appearance of the chest is concerning for multilobar bilateral bronchopneumonia, as above. 3. Aortic atherosclerosis. Electronically Signed   By: Trudie Reed M.D.   On: 08/12/2023 13:22    Anti-infectives: Anti-infectives (From admission, onward)    Start     Dose/Rate Route Frequency Ordered Stop   08/11/23 0000  piperacillin-tazobactam (ZOSYN) IVPB 2.25 g        2.25 g 100 mL/hr over 30 Minutes Intravenous Every 6 hours 08/10/23 1712     08/09/23 1545  piperacillin-tazobactam (ZOSYN) IVPB 3.375 g  Status:  Discontinued        3.375 g 12.5 mL/hr over 240 Minutes Intravenous Every 8 hours 08/09/23 1456 08/10/23 1712   08/09/23 0630  cefTRIAXone (ROCEPHIN) 2 g in sodium chloride 0.9 % 100 mL IVPB  2 g 200 mL/hr over 30 Minutes Intravenous On call 08/09/23 0614 08/09/23 1900       Assessment/Plan: 78 y/o F POD 4 ex-lap, LOA, R hemicolectomy, SBR x 3, POD 3 from SBR and ileostomy creation, and POD 2 washout and hematoma evacuation   - hgb overall stable without transfusion in last 24h. Vac output SS - NPO with NGT to LWS.  AROBF - having some liquid output - continue TPN - Continue abx  - plan for OR today for closure   LOS: 7  days   Eric Form, [facility] 08/13/2023, 8:29 AM Please see Amion for pager number during day hours 7:00am-4:30pm

## 2023-08-13 NOTE — Transfer of Care (Signed)
Immediate Anesthesia Transfer of Care Note  Patient: Brooke Sanders  Procedure(s) Performed: EXPLORATORY LAPAROTOMY, CLOSURE OF OPEN ABDOMEN, SMALL BOWEL RESECTION X2  Patient Location: ICU  Anesthesia Type:General  Level of Consciousness: sedated  Airway & Oxygen Therapy: Patient placed on Ventilator (see vital sign flow sheet for setting)  Post-op Assessment: Report given to RN and Post -op Vital signs reviewed and stable  Post vital signs: Reviewed and stable  Last Vitals:  Vitals Value Taken Time  BP 175/66 08/13/23  1505  Temp 36.4 C 08/13/23 1505  Pulse 64 08/13/23 1505  Resp 0 08/13/23 1505  SpO2 100 % 08/13/23 1505  Vitals shown include unfiled device data.  Last Pain:  Vitals:   08/13/23 1200  TempSrc: Esophageal  PainSc:          Complications: No notable events documented.

## 2023-08-13 NOTE — Progress Notes (Signed)
   08/13/23 1600  Spiritual Encounters  Type of Visit Initial  Care provided to: Pt and family  Conversation partners present during encounter Nurse  Referral source Nurse (RN/NT/LPN);Clinical staff  Reason for visit End-of-life  OnCall Visit No  Spiritual Framework  Presenting Themes Meaning/purpose/sources of inspiration;Coping tools;Courage hope and growth;Rituals and practive  Patient Stress Factors None identified  Family Stress Factors Loss;Loss of control;Major life changes  Interventions  Spiritual Care Interventions Made Established relationship of care and support;Compassionate presence;Reflective listening;Normalization of emotions;Decision-making support/facilitation;Explored ethical dilemma;Narrative/life review;Meaning making;Bereavement/grief support;Prayer  Intervention Outcomes  Outcomes Connection to spiritual care;Connection to values and goals of care;Awareness of support;Reduced fear;Patient family open to resources   Chaplain met with patient at bedsde who was surroudned by her family of 9 individuals.  Each member of the family spoke at length to Sorgho about their love and appreciation of her inmpact intheir lives.  At their request - prayed with the family surrounding the bed.  Provided suipport for their expression of anticipatory grief.

## 2023-08-13 NOTE — Progress Notes (Signed)
  Reviewed surgical notes  Uncontrolled hypertension Did receive hydralazine  Lopressor 5 mg every 6hrs added

## 2023-08-13 NOTE — Progress Notes (Addendum)
eLink Physician-Brief Progress Note Patient Name: Brooke Sanders DOB: 08/21/1945 MRN: [id number]   Date of Service  08/13/2023  HPI/Events of Note   CMP    K+ needs to be addressed    calcium 6.7   on vent with c-line and NG  eICU Interventions  Kcl via ng tube ordered, cr 2. Corrected calcium for low albumin around 7, 1 gm calcium ordered     Intervention Category Intermediate Interventions: Electrolyte abnormality - evaluation and management  Ranee Gosselin 08/13/2023, 6:16 AM 06:42 NG to suction, so NG Kcl removed, changed to IV replacement.

## 2023-08-13 NOTE — Anesthesia Postprocedure Evaluation (Signed)
Anesthesia Post Note  Patient: Brooke Sanders  Procedure(s) Performed: EXPLORATORY LAPAROTOMY, CLOSURE OF OPEN ABDOMEN, SMALL BOWEL RESECTION X2     Patient location during evaluation: SICU Anesthesia Type: General Level of consciousness: sedated Pain management: pain level controlled Vital Signs Assessment: post-procedure vital signs reviewed and stable Respiratory status: patient remains intubated per anesthesia plan Cardiovascular status: stable Postop Assessment: no apparent nausea or vomiting Anesthetic complications: no   No notable events documented.  Last Vitals:  Vitals:   08/13/23 1540 08/13/23 1545  BP:    Pulse: 73 73  Resp: (!) 0 (!) 0  Temp: (!) 36.2 C   SpO2: 100% 100%    Last Pain:  Vitals:   08/13/23 1200  TempSrc: Esophageal  PainSc:                  Trevor Iha

## 2023-08-13 NOTE — Progress Notes (Signed)
NAME:  Brooke Sanders, MRN:  [id number], DOB:  July 13, 1946, LOS: 7 ADMISSION DATE:  08/06/2023, CONSULTATION DATE:  1/16 REFERRING MD:  Dr. Tedra Senegal, CHIEF COMPLAINT:  SBO   History of Present Illness:  78 year old female with PMH as below, which is significant for remote SBO, HTN, HLD, hypothyroid, and sarcoidosis. She presented to [facility] 1/13 with complaints of nausea, vomiting, and abdominal pain x 3 days. Onset was 1/10 about 3 hours after eating a meal. She assumed this was due to food poisoning. Symptoms progressed to include nausea/vomiting and diarrhea. She then began to no longer have bowel movements or pass gas. She was unable to keep any PO intake including medications down. When symptoms did not resolve she presented to [facility] ED. CT scan showed evidence of small bowel obstruction.  General surgery was consulted and NGT was placed for decompression. Despite conservative management she did not improve and the decision was made to proceed to the OR. 1/16 she underwent ex lap with lysis of adhesions, small bowel resection with primary anastomosis x 3, and right colectomy. The terminal ileum and the abdomen were left in discontinuity and she was transferred to the ICU on the ventilator. PCCM was asked to evaluate for ICU care.   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   Significant Hospital Events: Including procedures, antibiotic start and stop dates in addition to other pertinent events   1/13 admit for SBO with conservative management To OR for adhesion lysis, colectomy, and small bowel resection. Post op on vent with open abd.  1/17 returned to OR 1/18 returned to OR for wash out  Interim History / Subjective:   For possible OR today No overnight events Weaning doses of Levophed  Objective   Blood pressure (!) 123/48, pulse 60, temperature 99 F (37.2 C), resp. rate (!) 25, height 5' (1.524 m), weight 70.4 kg, SpO2 98%.    Vent Mode: PRVC FiO2 (%):  [30 %-35 %] 30 % Set Rate:  [25 bmp] 25 bmp Vt Set:  [360 mL] 360 mL PEEP:  [5 cmH20] 5 cmH20 Plateau Pressure:  [14 cmH20-15 cmH20] 14 cmH20   Intake/Output Summary (Last 24 hours) at 08/13/2023 0737 Last data filed at 08/13/2023 0600 Gross per 24 hour  Intake 2447.97 ml  Output 3450 ml  Net -1002.03 ml   Filed Weights   08/11/23 0500 08/12/23 0500 08/13/23 0400  Weight: 68.2 kg 68.2 kg 70.4 kg    Examination: General: Frail, elderly, sedated HENT: Moist oral mucosa, endotracheal tube in place Lungs: Clear breath sounds bilaterally Cardiovascular: S1 and S2 appreciated Abdomen: Wound in place Extremities: 1+ edema, warm Neuro: sedated, PERRL GU: Foley  I reviewed nursing notes, Consultant notes, last 24 h vitals and pain scores, last 48 h intake and output, last 24 h labs and trends, and last 24 h imaging results.  Resolved Hospital Problem list     Assessment & Plan:   Small bowel obstruction -Failed conservative management -Was taken to the OR 1/16 for adhesiolysis, colectomy  and small bowel resection -Has wound VAC in place -Planned surgery for today for possible closure -Management per surgery -On Zosyn for intra-abdominal soilage-today is day 5 of Zosyn  Hypovolemic versus distributive shock -Continue vasopressor support -MAP goal of 65  Respiratory  sufficiency -Continue full vent support -VAP bundle -Anticipate being able to wean once closure  Electrolyte derangement Acute kidney injury -Maintain renal perfusion -Avoid nephrotoxic medications -Replete electrolytes -Positive fluid balance of 15 L  Transaminitis -Monitor  Acute blood loss anemia Thrombocytopenia -H&H stable -Continue to monitor  History of sarcoidosis-biopsy-proven -Not on any medications at present  Hypertension -Home medications on hold at present  History of hypothyroidism -Synthroid IV  History of restless legs -Was on home pregabalin -May be started once a ventilator and off sedation  SVT -Continue telemetry monitoring   Best Practice (right click and "Reselect all SmartList Selections" daily)   Diet/type: NPO, tpn DVT prophylaxis LMWH Pressure ulcer(s): N/A GI prophylaxis: PPI Lines: Central line Foley:  Yes, and it is still needed Code Status:  full code Last date of multidisciplinary goals of care discussion [discussed with patient's son at bedside]  Labs   CBC: Recent Labs  Lab 08/10/23 0409 08/10/23 1535 08/10/23 2024 08/11/23 0119 08/11/23 1107 08/11/23 1839 08/12/23 0305 08/13/23 0348  WBC 30.9*   < > 36.2* 21.0* 16.5*  --  23.7* 18.1*  NEUTROABS 26.9*  --   --  18.2* 14.3*  --  21.7* 16.1*  HGB 12.4   < > 10.8* 4.9* 6.8* 8.6* 10.4* 9.2*  HCT 39.5   < > 33.8* 14.9* 19.4* 24.6* 30.4* 27.8*  MCV 98.3   < > 96.6 94.9 88.2  --  89.7 92.7  PLT 148*   < > 110* 74* 61*  --  63* 47*   < > = values in this interval not displayed.    Basic Metabolic Panel: Recent Labs  Lab 08/09/23 1539 08/10/23 0409 08/10/23 2024 08/11/23 0119 08/11/23 1107 08/12/23 0305 08/13/23 0348  NA 149* 144 136 138 138 137 142  K 3.4* 5.0 6.0* 4.7 3.8 3.6 3.5  CL 117* 114* 108 106 103 104 107  CO2 21* 22 10* 22 24 26 27   GLUCOSE 115* 117* 174* 273* 114* 145* 157*  BUN 33* 41* 41* 45* 42* 51* 63*  CREATININE 0.99 2.27* 2.67* 2.00* 1.88*  2.20* 2.10*  CALCIUM 7.2* 7.0* 6.1* 5.8* 6.0* 6.4* 6.7*  MG 2.1 1.9  --  1.7  --  1.8 2.0  PHOS 4.3 2.4*  --  5.0*  --  4.3 2.5   GFR: Estimated Creatinine Clearance: 19 mL/min (A) (by C-G formula based on SCr of 2.1 mg/dL (H)). Recent Labs  Lab 08/06/23 1847 08/07/23 0517 08/09/23 1519 08/09/23 1539 08/10/23 2024 08/10/23 2302 08/11/23 0119 08/11/23 1107 08/12/23 0305 08/13/23 0348  WBC 5.3   < >  --    < > 36.2*  --  21.0* 16.5* 23.7* 18.1*  LATICACIDVEN 1.0  --  1.2  --  8.4* 5.6*  --   --   --   --    < > = values in this interval not displayed.    Liver Function Tests: Recent Labs  Lab 08/06/23 1847 08/09/23 0515 08/09/23 1539 08/10/23 0409 08/13/23 0348  AST 17 12* 19 19 383*  ALT 15 11 13 11  523*  ALKPHOS 37* 34* 37* 25* 60  BILITOT 0.9 0.6 0.6 0.5 0.6  PROT 6.6 5.9* 5.1* 4.7* 3.7*  ALBUMIN 3.6 3.2*  3.2* 2.6* <1.5*   Recent Labs  Lab 08/06/23 1054  LIPASE 15   No results for input(s): "AMMONIA" in the last 168 hours.  ABG    Component Value Date/Time   PHART 7.28 (L) 08/10/2023 1803   PCO2ART 32 08/10/2023 1803   PO2ART 381 (H) 08/10/2023 1803   HCO3 15.2 (L) 08/10/2023 1803   ACIDBASEDEF 10.6 (H) 08/10/2023 1803   O2SAT 100 08/10/2023 1803     Coagulation Profile: Recent Labs  Lab 08/10/23 1616 08/10/23 2302  INR 2.0* 2.1*    Cardiac Enzymes: No results for input(s): "CKTOTAL", "CKMB", "CKMBINDEX", "TROPONINI" in the last 168 hours.  HbA1C: Hemoglobin A1C  Date/Time Value Ref Range Status  12/12/2022 10:36 AM 5.0 4.0 - 5.6 % Final    CBG: Recent Labs  Lab 08/12/23 1217 08/12/23 1641 08/12/23 1955 08/13/23 0034 08/13/23 0356  GLUCAP 132* 143* 169* 175* 144*   The patient is critically ill with multiple organ systems failure and requires high complexity decision making for assessment and support, frequent evaluation and titration of therapies, application of advanced monitoring technologies and extensive interpretation of  multiple databases. Critical Care Time devoted to patient care services described in this note independent of APP/resident time (if applicable)  is 31 minutes.   Virl Diamond MD [facility] Personal pager: See Amion If unanswered, please page CCM On-call: #[phone]

## 2023-08-13 NOTE — Plan of Care (Signed)
  Problem: Clinical Measurements: Goal: Ability to maintain clinical measurements within normal limits will improve Outcome: Not Progressing   Problem: Nutrition: Goal: Adequate nutrition will be maintained Outcome: Not Progressing   Problem: Pain Management: Goal: General experience of comfort will improve Outcome: Not Progressing   Problem: Safety: Goal: Ability to remain free from injury will improve Outcome: Not Progressing   Problem: Skin Integrity: Goal: Risk for impaired skin integrity will decrease Outcome: Not Progressing

## 2023-08-14 ENCOUNTER — Encounter (HOSPITAL_COMMUNITY): Payer: Self-pay | Admitting: Surgery

## 2023-08-14 DIAGNOSIS — Z7189 Other specified counseling: Secondary | ICD-10-CM

## 2023-08-14 DIAGNOSIS — Z515 Encounter for palliative care: Secondary | ICD-10-CM

## 2023-08-14 DIAGNOSIS — K56609 Unspecified intestinal obstruction, unspecified as to partial versus complete obstruction: Secondary | ICD-10-CM | POA: Diagnosis not present

## 2023-08-14 LAB — COMPREHENSIVE METABOLIC PANEL
ALT: 322 U/L — ABNORMAL HIGH (ref 0–44)
AST: 186 U/L — ABNORMAL HIGH (ref 15–41)
Albumin: <1.5 g/dL — ABNORMAL LOW (ref 3.5–5.0)
Alkaline Phosphatase: 104 U/L (ref 38–126)
Anion gap: 7 (ref 5–15)
BUN: 68 mg/dL — ABNORMAL HIGH (ref 8–23)
CO2: 24 mmol/L (ref 22–32)
Calcium: 6.4 mg/dL — CL (ref 8.9–10.3)
Chloride: 106 mmol/L (ref 98–111)
Creatinine, Ser: 1.89 mg/dL — ABNORMAL HIGH (ref 0.44–1.00)
GFR, Estimated: 27 mL/min — ABNORMAL LOW (ref >60)
Glucose, Bld: 119 mg/dL — ABNORMAL HIGH (ref 70–99)
Potassium: 3.3 mmol/L — ABNORMAL LOW (ref 3.5–5.1)
Sodium: 137 mmol/L (ref 135–145)
Total Bilirubin: 0.5 mg/dL (ref 0.0–1.2)
Total Protein: 3.8 g/dL — ABNORMAL LOW (ref 6.5–8.1)

## 2023-08-14 LAB — CBC WITH DIFFERENTIAL/PLATELET
Abs Immature Granulocytes: 0.2 10*3/uL — ABNORMAL HIGH (ref 0.00–0.07)
Band Neutrophils: 1 %
Basophils Absolute: 0 10*3/uL (ref 0.0–0.1)
Basophils Relative: 0 %
Eosinophils Absolute: 0.2 10*3/uL (ref 0.0–0.5)
Eosinophils Relative: 1 %
HCT: 31 % — ABNORMAL LOW (ref 36.0–46.0)
Hemoglobin: 10.1 g/dL — ABNORMAL LOW (ref 12.0–15.0)
Lymphocytes Relative: 10 %
Lymphs Abs: 1.9 10*3/uL (ref 0.7–4.0)
MCH: 30.7 pg (ref 26.0–34.0)
MCHC: 32.6 g/dL (ref 30.0–36.0)
MCV: 94.2 fL (ref 80.0–100.0)
Metamyelocytes Relative: 1 %
Monocytes Absolute: 0.4 10*3/uL (ref 0.1–1.0)
Monocytes Relative: 2 %
Neutro Abs: 16.2 10*3/uL — ABNORMAL HIGH (ref 1.7–7.7)
Neutrophils Relative %: 85 %
Platelets: 53 10*3/uL — ABNORMAL LOW (ref 150–400)
RBC: 3.29 MIL/uL — ABNORMAL LOW (ref 3.87–5.11)
RDW: 15 % (ref 11.5–15.5)
WBC: 18.8 10*3/uL — ABNORMAL HIGH (ref 4.0–10.5)
nRBC: 0.1 % (ref 0.0–0.2)

## 2023-08-14 LAB — MAGNESIUM: Magnesium: 1.8 mg/dL (ref 1.7–2.4)

## 2023-08-14 LAB — GLUCOSE, CAPILLARY
Glucose-Capillary: 104 mg/dL — ABNORMAL HIGH (ref 70–99)
Glucose-Capillary: 122 mg/dL — ABNORMAL HIGH (ref 70–99)
Glucose-Capillary: 133 mg/dL — ABNORMAL HIGH (ref 70–99)

## 2023-08-14 LAB — PHOSPHORUS: Phosphorus: 2.2 mg/dL — ABNORMAL LOW (ref 2.5–4.6)

## 2023-08-14 LAB — CULTURE, RESPIRATORY W GRAM STAIN

## 2023-08-14 MED ORDER — POTASSIUM PHOSPHATES 15 MMOLE/5ML IV SOLN
15.0000 mmol | Freq: Once | INTRAVENOUS | Status: AC
  Administered 2023-08-14: 15 mmol via INTRAVENOUS
  Filled 2023-08-14: qty 5

## 2023-08-14 MED ORDER — INSULIN ASPART 100 UNIT/ML IJ SOLN
0.0000 [IU] | Freq: Four times a day (QID) | INTRAMUSCULAR | Status: DC
Start: 2023-08-14 — End: 2023-08-14

## 2023-08-14 MED ORDER — FUROSEMIDE 10 MG/ML IJ SOLN
40.0000 mg | Freq: Once | INTRAMUSCULAR | Status: AC
  Administered 2023-08-14: 40 mg via INTRAVENOUS
  Filled 2023-08-14: qty 4

## 2023-08-14 MED ORDER — TRAVASOL 10 % IV SOLN
INTRAVENOUS | Status: DC
  Filled 2023-08-14: qty 445.2

## 2023-08-14 MED ORDER — LEVOTHYROXINE SODIUM 100 MCG/5ML IV SOLN: 60.0000 ug | Freq: Every day | INTRAVENOUS | Status: DC

## 2023-08-14 MED ORDER — CALCIUM GLUCONATE-NACL 1-0.675 GM/50ML-% IV SOLN
1.0000 g | Freq: Once | INTRAVENOUS | Status: AC
  Administered 2023-08-14: 1000 mg via INTRAVENOUS
  Filled 2023-08-14: qty 50

## 2023-08-14 NOTE — Plan of Care (Signed)
  Problem: Clinical Measurements: Goal: Ability to maintain clinical measurements within normal limits will improve Outcome: Not Progressing Goal: Respiratory complications will improve Outcome: Not Progressing Goal: Cardiovascular complication will be avoided Outcome: Not Progressing   Problem: Elimination: Goal: Will not experience complications related to bowel motility Outcome: Not Progressing

## 2023-08-14 NOTE — Progress Notes (Signed)
Did visit with the family  Ongoing goals of care discussions  Holding course at present  Palliative care consulted, they do not want patient to suffer  Liberalize family visitation

## 2023-08-14 NOTE — Progress Notes (Signed)
Nutrition Follow-up  DOCUMENTATION CODES:   Non-severe (moderate) malnutrition in context of chronic illness  INTERVENTION:  - Plan to continue TPN. Pharmacy changing to custom TPN this evening.               - TPN management per pharmacy.    - Monitor magnesium, potassium, and phosphorus BID for at least 3 days, MD to replete as needed, as pt is at risk for refeeding syndrome. - She has completed 100 mg Thiamine x5 days.   NUTRITION DIAGNOSIS:   Moderate Malnutrition related to chronic illness as evidenced by moderate fat depletion, severe muscle depletion. *ongoing  GOAL:   Patient will meet greater than or equal to 90% of their needs *progressing, on TPN  MONITOR:   Vent status, Labs, Weight trends, I & O's  REASON FOR ASSESSMENT:   Consult New TPN/TNA  ASSESSMENT:   77 year old female with PMH significant for remote SBO, HTN, HLD, Grave's disease who presented 1/13 with complaints of nausea, vomiting, and abdominal pain x 3 days. Admitted for small bowel obstruction.  1/13 Admit; NGT placed for suction 1/16 s/p ex-lap with small bowel resection x3 and right hemicolectomy, ABThera application  1/17 ex-lap, small bowel resection x1, end ileostomy, ABThera 1/18 ex-lap and washout, ABThera; TPN initiated 1/20 ex-lap with resection of small bowel anastomosis x2 and new anastomoses, closure of abdomen, drain placement  Patient remains intubated on ventilator support MV: 9 L/min Temp (24hrs), Avg:98.2 F (36.8 C), Min:97 F (36.1 C), Max:99.5 F (37.5 C)  She remains on TPN.  Patient has been receiving Clinimix 810 @ 50L/hr + D10 @ 41mL/hr which provides 1200 kcals and 96g of protein.   Per pharmacy today, due to decrease in electrolytes and down-trending LFTs, plan to transition to custom TPN today.  TPN to start at 80mL/hr which will provide 44g of protein and 795kcals. Pharmacy discontinuing D10 today.  Patient has now gone to the OR x4 over the past 5 days for  multiple small bowel resections.  Last OR note from yesterday indicates that from the second anastomosis  to the ileostomy was about 150cm. 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 Surgery note today indicates patient is not a candidate for any further surgical intervention due to the amount of small bowel remaining.  If patient does have short bowel syndrome, she would likely require long term TPN due to malabsorption with short gut.  Per CCM notes, son reports the patient's independence is very importance to her and her QOL and that she would not want to be dependent if unable to transition to close to previous QOL. Palliative care now consulted.     Medications reviewed and include: 100mg  thiamine (x5 days, last dose today), D10 @ 30mL/hr (provides 408 kcals over 24 hours)  Labs reviewed:  K+ 3.3 Creatinine 1.89 Triglycerides 179 (as of 1/20)   Diet Order:   Diet Order             Diet NPO time specified  Diet effective now                   EDUCATION NEEDS:  Education needs have been addressed  Skin:  Skin Assessment: Skin Integrity Issues: Skin Integrity Issues:: Incisions Incisions: Abdomen  Last BM:  1/21 - type 7  Height:  Ht Readings from Last 1 Encounters:  08/09/23 5' (1.524 m)   Weight:  Wt Readings from Last 1 Encounters:  08/14/23 67.9 kg  BMI:  Body mass index is 29.23 kg/m.  Estimated Nutritional Needs:  Kcal:  1500-1750 kcals Protein:  80-95 grams Fluid:  >/= 1.5L    Shelle Iron RD, LDN Contact via Secure Chat.

## 2023-08-14 NOTE — Progress Notes (Signed)
PHARMACY - TOTAL PARENTERAL NUTRITION CONSULT NOTE   Indication: massive bowel resection  Patient Measurements: Height: 5' (152.4 cm) Weight: 67.9 kg (149 lb 11.1 oz) IBW/kg (Calculated) : 45.5 TPN AdjBW (KG): 49.3 Body mass index is 29.23 kg/m. Usual Weight: ~55 kg in November (weight has actually increased  Assessment:  53 yoF admitted with n/v, abdominal pain starting 1/10. Failed conservative management, and required exploratory laparotomy on 1/16. Surgery revealed ischemia and necrosis of the terminal ileum/cecum, requiring resection of this as well as the ascending colon. Bowel was left in discontinuity for now. Pharmacy consulted to start TPN on 1/17 d/t anticipated prolonged NPO status.  Significant events: 1/17: TPN mixed and delivered to ICU, but not given d/t limited access and need for multiple critical medications (pressors, incompatible fluids, etc). Also noted to have significant bleeding from open abdomen throughout the night, requiring multiple units of PRBC and IVF resuscitation  Glucose: No Hx DM - Goal 100-150 - CBGs well controlled w/ no SSI used - note current TPN is especially low in dextrose - D10 infusion running at 39mL/hr Electrolytes: - K slightly low at 3.3 - Phos low at 2.2 - CoCa 8.4 using albumin 1.5--slightly low - Na, Mg WNL Renal:  - AKI slightly improved today with Scr 1.89 - BUN continues to trend up - UOP stable Hepatic: (1/21)  - Tbili, alk phos WNL - Albumin remains low at <1.5 - LFTs elevated but down-trending from yesterday - TG elevated 152 >> 179 from 1/17>>1/20 (propofol now off) I/O: received about 4L in blood products/FFP over the past 4 days but has been stable since 1/18 evening - NG output ~550 mL/24 hrs - UOP 2.1 L/24 hrs - LBM recorded 1/21 GI Imaging: - 1/13 CT a/p: SBO with multiple transition points, possible ischemia; biliary duct dilatation - 1/13 and 1/15 AXRs show worsening SBO GI Surgeries / Procedures:  - 1/16  ex lap: resection x 3 of terminal ileum and R hemicolectomy; abdomen left open & bowel discontinuous - 1/17 return to OR for second look: further resection of unviable small bowel; creation of new anastomosis and end ileostomy (approx 150 cm SB remaining); abd remains open - 1/18 emergent return to OR for hemoperitoneum: evacuation of clot and cauterization of bleeding at 1 of the 2 anastomotic sites; abd remains open - 1/20 Exploratory laparotomy with resection of both small bowel anastomoses and creation of new small bowel anastomosis and closure of abdomen with drain placement   Central access: R internal jugular placed 1/16 in OR; 3x lumen HD cath placed in femoral vein 1/18 TPN start date: 1/17 (not given 1/17)  Nutritional Goals: RD Assessment: pending Estimated Needs Total Energy Estimated Needs: 1500-1750 kcals Total Protein Estimated Needs: 80-95 grams Total Fluid Estimated Needs: >/= 1.5L  Current Nutrition:  NPO, Clinimix 8/10 at 50 ml/hr, D10 @50mL /hr - no longer on propofol  Given decrease in electrolytes and down-trending LFTs, will transition from Clinimix 8/10 to custom TPN 1/21 PM. Custom TPN at goal rate of 22mL/hr will provide 89.4 g protein per day and 1591 total kcal per day.   Plan:  Now Kphos 15 mmol IV x1 ordered by MD Ca 1g x1 ordered by MD  At 1800 today: Start TPN at half of goal rate, 35 mL/hr (will provide 44 g of protein per day and 795 total kcal per day) Discontinue D10 infusion  Add the following electrolytes: Na 30 mEq/L K 40 mEq/L Ca 5 mEq/L Mg 5 mEq/L Phos 15 mmol/L  ZO:XWRUEAV ratio 1:1 Add standard MVI and trace elements to TPN Chromium currently on hold d/t Sport and exercise psychologist Per RD recommendations, will continue thiamine 100 mg IV x 5 days for refeeding risk Continue very sensitive scale SSI with q6hr CBG checks Monitor TPN labs on Mon/Thurs and PRN   Cherylin Mylar, PharmD Clinical Pharmacist  1/21/20257:05 AM

## 2023-08-14 NOTE — Progress Notes (Signed)
NAME:  Brooke Sanders, MRN:  [id number], DOB:  1945/12/12, LOS: 8 ADMISSION DATE:  08/06/2023, CONSULTATION DATE:  1/16 REFERRING MD:  Dr. Tedra Senegal, CHIEF COMPLAINT:  SBO   History of Present Illness:  78 year old female with PMH as below, which is significant for remote SBO, HTN, HLD, hypothyroid, and sarcoidosis. She presented to [facility] 1/13 with complaints of nausea, vomiting, and abdominal pain x 3 days. Onset was 1/10 about 3 hours after eating a meal. She assumed this was due to food poisoning. Symptoms progressed to include nausea/vomiting and diarrhea. She then began to no longer have bowel movements or pass gas. She was unable to keep any PO intake including medications down. When symptoms did not resolve she presented to [facility] ED. CT scan showed evidence of small bowel obstruction.  General surgery was consulted and NGT was placed for decompression. Despite conservative management she did not improve and the decision was made to proceed to the OR. 1/16 she underwent ex lap with lysis of adhesions, small bowel resection with primary anastomosis x 3, and right colectomy. The terminal ileum and the abdomen were left in discontinuity and she was transferred to the ICU on the ventilator. PCCM was asked to evaluate for ICU care.   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  Significant Hospital Events: Including procedures, antibiotic start and stop dates in addition to other pertinent events   1/13 admit for SBO with conservative management To OR for adhesion lysis, colectomy, and small bowel resection. Post op on vent with open abd.  1/17 returned to OR 1/18 returned to OR for wash out 1/20-back to the OR, postop notes reviewed  Interim History / Subjective:   Tolerated surgery well Surgical notes reviewed Discussed at length with son at bedside today Off pressors Having issues with blood pressure control  Objective   Blood pressure (!) 137/43, pulse 99, temperature 99 F (37.2 C), resp. rate (!) 25, height 5' (1.524 m), weight 67.9 kg, SpO2 99%.    Vent Mode: PRVC FiO2 (%):  [30 %-40 %] 30 % Set Rate:  [25 bmp] 25 bmp Vt Set:  [360 mL] 360 mL PEEP:  [5 cmH20] 5 cmH20 Plateau Pressure:  [14 cmH20-15 cmH20] 14 cmH20   Intake/Output Summary (Last 24 hours) at 08/14/2023 1324 Last data filed at 08/14/2023 4010 Gross per 24 hour  Intake 3316.02 ml  Output 3410 ml  Net -93.98 ml   Filed Weights   08/12/23 0500 08/13/23 0400 08/14/23 0400  Weight: 68.2 kg 70.4 kg 67.9 kg    Examination: General: Frail, intubated, sedated HENT: Moist oral mucosa, endotracheal tube in place Lungs: Clear breath sounds bilaterally Cardiovascular: S1 and S2 appreciated Abdomen: Postsurgical, dressings in place Extremities: Skin is warm and dry, 1+ edema Neuro: Sedated GU: Foley  I reviewed nursing notes, Consultant notes,  last 24 h vitals and pain scores, last 48 h intake and output, last 24 h labs and trends, and last 24 h imaging results.  Resolved Hospital Problem list  Assessment & Plan:   Small bowel obstruction -Failed conservative management -Was taken to the OR 1/16 for adhesiolysis, colectomy and small bowel resection -Back in the OR 1/20 for possible closure, nonviable tissue debrided, documentation of still dusky  tissue -Follow-up on surgery -On Zosyn for intra-abdominal soilage, today is day 6 of Zosyn  Hypovolemic versus distributive shock -Able to wean off vasopressors -Now actually hypertensive and with trying to treat with home dose beta-blockers and as needed Apresoline  Hypertension -As needed Apresoline -IV Lopressor -Pain and agitation management  Respiratory insufficiency -Maintain VAP bundle -Continue full vent support -Weaning as tolerated  Electrolyte derangement Acute kidney injury -Continue to maintain renal perfusion -Replete electrolytes -Positive fluid balance -Will start cautious diuresis -Lasix 40 ordered  Transaminitis -Continue to monitor  Acute blood loss anemia Thrombocytopenia -Continue to monitor  History of biopsy-proven sarcoidosis -Not on any medications at present  History of hypothyroidism -IV Synthroid  History of restless legs -Was on pregabalin at home -Hold for now  SVT Continue telemetry monitoring  Ongoing goals of care discussions  According to son, patient will not want to be dependent if unable to transition to close to previous quality of life A lot will depend on stability of intra-abdominal process Will touch base with surgery I did have discussions with patients son regarding having family visit should patient not be able to wean or with ongoing difficulty with stabilizing intra-abdominal process   Best Practice (right click and "Reselect all SmartList Selections" daily)   Diet/type: NPO, tpn DVT prophylaxis LMWH Pressure ulcer(s): N/A GI prophylaxis: PPI Lines: Central line Foley:  Yes, and it is still needed Code Status:  full code Last date of multidisciplinary goals of care discussion [discussed with patient's son at bedside]  Labs   CBC: Recent Labs  Lab 08/11/23 0119 08/11/23 1107 08/11/23 1839 08/12/23 0305 08/13/23 0348 08/14/23 0341  WBC 21.0* 16.5*  --  23.7* 18.1* 18.8*  NEUTROABS 18.2* 14.3*  --   21.7* 16.1* 16.2*  HGB 4.9* 6.8* 8.6* 10.4* 9.2* 10.1*  HCT 14.9* 19.4* 24.6* 30.4* 27.8* 31.0*  MCV 94.9 88.2  --  89.7 92.7 94.2  PLT 74* 61*  --  63* 47* 53*    Basic Metabolic Panel: Recent Labs  Lab 08/10/23 0409 08/10/23 2024 08/11/23 0119 08/11/23 1107 08/12/23 0305 08/13/23 0348 08/14/23 0341  NA 144   < > 138 138 137 142 137  K 5.0   < > 4.7 3.8 3.6 3.5 3.3*  CL 114*   < > 106 103 104 107 106  CO2 22   < > 22 24 26 27 24   GLUCOSE 117*   < > 273* 114* 145* 157* 119*  BUN 41*   < > 45* 42* 51* 63* 68*  CREATININE 2.27*   < > 2.00* 1.88* 2.20* 2.10* 1.89*  CALCIUM 7.0*   < > 5.8* 6.0* 6.4* 6.7* 6.4*  MG 1.9  --  1.7  --  1.8 2.0 1.8  PHOS 2.4*  --  5.0*  --  4.3 2.5 2.2*   < > = values in this interval not displayed.   GFR: Estimated Creatinine Clearance: 20.8 mL/min (A) (by C-G formula based on SCr of 1.89 mg/dL (H)). Recent Labs  Lab 08/09/23 1519 08/09/23 1539 08/10/23 2024 08/10/23 2302 08/11/23 0119 08/11/23 1107 08/12/23 0305 08/13/23 0348 08/14/23 0341  WBC  --    < > 36.2*  --    < > 16.5* 23.7* 18.1* 18.8*  LATICACIDVEN  1.2  --  8.4* 5.6*  --   --   --   --   --    < > = values in this interval not displayed.    Liver Function Tests: Recent Labs  Lab 08/09/23 0515 08/09/23 1539 08/10/23 0409 08/13/23 0348 08/14/23 0341  AST 12* 19 19 383* 186*  ALT 11 13 11  523* 322*  ALKPHOS 34* 37* 25* 60 104  BILITOT 0.6 0.6 0.5 0.6 0.5  PROT 5.9* 5.1* 4.7* 3.7* 3.8*  ALBUMIN 3.2* 3.2* 2.6* <1.5* <1.5*   No results for input(s): "LIPASE", "AMYLASE" in the last 168 hours.  No results for input(s): "AMMONIA" in the last 168 hours.  ABG    Component Value Date/Time   PHART 7.28 (L) 08/10/2023 1803   PCO2ART 32 08/10/2023 1803   PO2ART 381 (H) 08/10/2023 1803   HCO3 15.2 (L) 08/10/2023 1803   ACIDBASEDEF 10.6 (H) 08/10/2023 1803   O2SAT 100 08/10/2023 1803     Coagulation Profile: Recent Labs  Lab 08/10/23 1616 08/10/23 2302  INR 2.0*  2.1*    Cardiac Enzymes: No results for input(s): "CKTOTAL", "CKMB", "CKMBINDEX", "TROPONINI" in the last 168 hours.  HbA1C: Hemoglobin A1C  Date/Time Value Ref Range Status  12/12/2022 10:36 AM 5.0 4.0 - 5.6 % Final    CBG: Recent Labs  Lab 08/13/23 1155 08/13/23 1540 08/13/23 2003 08/13/23 2333 08/14/23 0352  GLUCAP 138* 122* 120* 115* 104*   The patient is critically ill with multiple organ systems failure and requires high complexity decision making for assessment and support, frequent evaluation and titration of therapies, application of advanced monitoring technologies and extensive interpretation of multiple databases. Critical Care Time devoted to patient care services described in this note independent of APP/resident time (if applicable)  is 40 minutes.   Virl Diamond MD [facility] Personal pager: See Amion If unanswered, please page CCM On-call: [phone]

## 2023-08-14 NOTE — Progress Notes (Signed)
Progress Note  1 Day Post-Op  Subjective: Off pressor. Son at bedside. He is questioning how long to give his mother to see if she is going to improve. He reports that her independence is very important to patient and patient's QOL.   Objective: Vital signs in last 24 hours: Temp:  [97 F (36.1 C)-99.5 F (37.5 C)] 98.4 F (36.9 C) (01/21 0900) Pulse Rate:  [64-126] 111 (01/21 0900) Resp:  [0-31] 25 (01/21 0900) BP: (135-193)/(43-58) 137/43 (01/21 0317) SpO2:  [98 %-100 %] 100 % (01/21 0900) Arterial Line BP: (124-211)/(39-67) 184/62 (01/21 0900) FiO2 (%):  [30 %-40 %] 30 % (01/21 0800) Weight:  [67.9 kg] 67.9 kg (01/21 0400) Last BM Date : 08/14/23  Intake/Output from previous day: 01/20 0701 - 01/21 0700 In: 3316 [I.V.:2807.3; IV Piggyback:508.7] Out: 3410 [Urine:2125; Emesis/NG output:650; Drains:520; Stool:15; Blood:100] Intake/Output this shift: Total I/O In: 363.8 [I.V.:271.8; IV Piggyback:92] Out: 150 [Urine:150]  PE: Constitutional: No acute distress. On Vent Lungs: intubated CV: regular rate and rhythm, mild edema in BL hands GI: Soft, wound clean and no evidence of fascial dehiscence at this time, ileostomy viable, nothing in ostomy appliance yet, drain SS   Lab Results:  Recent Labs    08/13/23 0348 08/14/23 0341  WBC 18.1* 18.8*  HGB 9.2* 10.1*  HCT 27.8* 31.0*  PLT 47* 53*   BMET Recent Labs    08/13/23 0348 08/14/23 0341  NA 142 137  K 3.5 3.3*  CL 107 106  CO2 27 24  GLUCOSE 157* 119*  BUN 63* 68*  CREATININE 2.10* 1.89*  CALCIUM 6.7* 6.4*   PT/INR No results for input(s): "LABPROT", "INR" in the last 72 hours. CMP     Component Value Date/Time   NA 137 08/14/2023 0341   K 3.3 (L) 08/14/2023 0341   CL 106 08/14/2023 0341   CO2 24 08/14/2023 0341   GLUCOSE 119 (H) 08/14/2023 0341   BUN 68 (H) 08/14/2023 0341   CREATININE 1.89 (H) 08/14/2023 0341   CREATININE 0.79 05/23/2023 1111   CALCIUM 6.4 (LL) 08/14/2023 0341   PROT 3.8  (L) 08/14/2023 0341   ALBUMIN <1.5 (L) 08/14/2023 0341   AST 186 (H) 08/14/2023 0341   ALT 322 (H) 08/14/2023 0341   ALKPHOS 104 08/14/2023 0341   BILITOT 0.5 08/14/2023 0341   GFRNONAA 27 (L) 08/14/2023 0341   GFRNONAA 84 11/11/2020 1110   GFRAA 98 11/11/2020 1110   Lipase     Component Value Date/Time   LIPASE 15 08/06/2023 1054       Studies/Results: No results found.  Anti-infectives: Anti-infectives (From admission, onward)    Start     Dose/Rate Route Frequency Ordered Stop   08/11/23 0000  piperacillin-tazobactam (ZOSYN) IVPB 2.25 g        2.25 g 100 mL/hr over 30 Minutes Intravenous Every 6 hours 08/10/23 1712     08/09/23 1545  piperacillin-tazobactam (ZOSYN) IVPB 3.375 g  Status:  Discontinued        3.375 g 12.5 mL/hr over 240 Minutes Intravenous Every 8 hours 08/09/23 1456 08/10/23 1712   08/09/23 0630  cefTRIAXone (ROCEPHIN) 2 g in sodium chloride 0.9 % 100 mL IVPB        2 g 200 mL/hr over 30 Minutes Intravenous On call 08/09/23 0614 08/09/23 1900        Assessment/Plan  SBO Septic shock - off pressors  POD5 s/p ex-lap with small bowel resection x3 and right hemicolectomy, ABThera application Dr. Dwain Sarna  POD4 ex-lap small bowel resection x1, end ileostomy, ABThera Dr. Dwain Sarna POD3 ex-lap and washout, ABThera Dr. Derrell Lolling POD1 ex-lap with resection of small bowel anastomosis x2 and new anastomoses, closure of abdomen, drain placement Dr. Luisa Hart - NGT with bilious drainage, no gas or effluent in ileostomy bag yet - continue abx and TPN - JP drain with SS fluid  - continue foley - not a candidate for any further surgical intervention given amount of small bowel remaining - will discuss with MD as far as recommendations around GOC - in discussion with son, independence was very important to QOL for patient. Seems reasonable to see how she does over the next 24-48 hrs before making any major decisions on all this    FEN: NPO, TPN, NGT to LIWS VTE:  lovenox ID: [id number] 1/16>>   AKI - Cr 1.89, improving, UOP improving Post-op VDRF - vent per CCM Hx of CVA RLS Fibromyalgia HTN Hx of Hep C Graves disease   LOS: 8 days      Juliet Rude, [facility] Surgery 08/14/2023, 10:20 AM Please see Amion for pager number during day hours 7:00am-4:30pm

## 2023-08-14 NOTE — Consult Note (Signed)
Palliative Medicine Inpatient Consult Note  Consulting Provider:  Dorena Dew   Reason for consult:   Palliative Care Consult Services Palliative Medicine Consult  Reason for Consult? s/p open abdomen, GOC discussion   08/14/2023  HPI:  Per intake H&P --> 78 year old female with PMH as below, which is significant for remote SBO, HTN, HLD, hypothyroid, and sarcoidosis. Has had a complicated hospitalization in the setting of septic shock from a SBO s/p ex-lap with small bowel resection and right hemicolectomy. Palliative care has been asked to get involved to further address goals of care.   Clinical Assessment/Goals of Care:  *Please note that this is a verbal dictation therefore any spelling or grammatical errors are due to the "Dragon Medical One" system interpretation.  I have reviewed medical records including EPIC notes, labs and imaging, received report from bedside RN, assessed the patient.    I met with patients three children this evening  to further discuss diagnosis prognosis, GOC, EOL wishes, disposition and options.   I introduced Palliative Medicine as specialized medical care for people living with serious illness. It focuses on providing relief from the symptoms and stress of a serious illness. The goal is to improve quality of life for both the patient and the family.  Medical History Review and Understanding:  A review of Kealie's past medical history was completed inclusive of her SBO, HTN, HLD, hypothyroidism, and sarcoidosis.  Social History:  Saddie Benders is originally from [place] though raised her children in [place]. She is not married. She has four children and two grandchildren.  She is identified as the "best mother". Her children share that she always made them feel like they had everything despite financial hardships. She made every moment and birthday very special for her children. They describe her as a big child herself. 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  Dondra loved adventure and per her children spent time with each of them living in their various states inclusive of [place], [place], and [place].   Deijah was an intensely private woman and also quite prideful of her appearance.   Functional and Nutritional State:  Preceding hospitalization Yomira was fully functional of all bADLs and iADLs. She had a good appetite and would often go out to eat with her son, Thurmond Butts.   Advance Directives:  A detailed discussion was had today regarding advanced directives.  Denelle defers to her children for additional decision making.   Discussion:  We reviewed the complex nature of patient prior medical conditions. We discussed how she has lived with pain for a prolonged period of time due to her medical disease burden.   Patients family observe all that she has been through since last week. They note the various surgical interventions she has endured and the poor improvement(s) which have been made. They have all spoken at length with both the surgical team and CCM team. They do not feel that allowing more time will change patients outcomes and note how agonizing it is to see their mother in this present state.   Patients family and I discussed options from here, notable comfort care. We talked about transition to comfort measures in house and what that would entail inclusive of medications to control pain, dyspnea, agitation, nausea, itching, and hiccups.  We discussed stopping all uneccessary measures such as cardiac monitoring, blood draws, needle sticks, and frequent vital signs.   Patients  children reflects on the beautiful person she is and how much she has done for them throughout their lives. They note the strength she instilled in them. Utilized reflective listening throughout our time together.   We plan to meet at [facility] tomorrow and the pursuit of comfort measures  thereafter.   Discussed the importance of continued conversation with family and their  medical providers regarding overall plan of care and treatment options, ensuring decisions are within the context of the patients values and GOCs.  Decision Maker: Orlan Leavens (Son): [phone] (Mobile)   SUMMARY OF RECOMMENDATIONS   Patients family are aware of of poor long term prognosis. They have met and determined that they would like to pursue comfort measures tomorrow morning. We plan to meet at [facility] for further conversation(s).  Family kindly asks that the surgery team do not round in the morning as they feel confident with the decision they are making.   Ongoing PMT support.  Palliative Prophylaxis:  Aspiration, Bowel Regimen, Delirium Protocol, Frequent Pain Assessment, Oral Care, Palliative Wound Care, and Turn Reposition  Additional Recommendations (Limitations, Scope, Preferences): Continue current care.   Psycho-social/Spiritual:  Desire for further Chaplaincy support: Not at this time.  Additional Recommendations: Education on end of life care.    Prognosis: Limited overall - once comfort pursued anticipate hours to days.   Discharge Planning: Discharge will be celestial.   Vitals:   08/14/23 1300 08/14/23 1350  BP:    Pulse: 76 77  Resp: (!) 25 (!) 25  Temp: 99.3 F (37.4 C) 99.3 F (37.4 C)  SpO2: 100% 100%    Intake/Output Summary (Last 24 hours) at 08/14/2023 1622 Last data filed at 08/14/2023 1307 Gross per 24 hour  Intake 2865.63 ml  Output 2985 ml  Net -119.37 ml   Last Weight  Most recent update: 08/14/2023  4:05 AM    Weight  67.9 kg (149 lb 11.1 oz)            Gen: Elderly Caucasian F acutely ill appearing HEENT: ETT, NGT, dry mucous membranes CV: Regular rate and rhythm  PULM: On mechanical ventilator ABD: (+) ostomy EXT: (+) edema Neuro:  Somnolent  PPS: 10%   This conversation/these recommendations were discussed with patient primary  care team, Dr. Wynona Neat  Billing based on MDM: High  Problems Addressed: One acute or chronic illness or injury that poses a threat to life or bodily function  Amount and/or Complexity of Data: Category 3:Discussion of management or test interpretation with external physician/other qualified health care professional/appropriate source (not separately reported)  Risks: Parenteral controlled substances, Decision regarding elective major surgery with identified patient or procedure risk factors, Decision regarding hospitalization or escalation of hospital care, and Decision not to resuscitate or to de-escalate care because of poor prognosis ______________________________________________________ [address] Palliative Medicine Team Team Cell Phone: [phone] Please utilize secure chat with additional questions, if there is no response within 30 minutes please call the above phone number  Palliative Medicine Team providers are available by phone from 7am to 7pm daily and can be reached through the team cell phone.  Should this patient require assistance outside of these hours, please call the patient's attending physician.

## 2023-08-14 NOTE — Progress Notes (Signed)
eLink Physician-Brief Progress Note Patient Name: Brooke Sanders DOB: 1946/06/09 MRN: [id number]   Date of Service  08/14/2023  HPI/Events of Note  K, phos and calcium noted. Correction cal is also low  eICU Interventions  15 mmol K phos and 1 gram cal gluconate ordered     Intervention Category Major Interventions: Electrolyte abnormality - evaluation and management  Oretha Milch 08/14/2023, 4:39 AM

## 2023-08-15 DIAGNOSIS — I10 Essential (primary) hypertension: Secondary | ICD-10-CM | POA: Diagnosis not present

## 2023-08-15 DIAGNOSIS — K56609 Unspecified intestinal obstruction, unspecified as to partial versus complete obstruction: Secondary | ICD-10-CM | POA: Diagnosis not present

## 2023-08-15 LAB — SURGICAL PATHOLOGY

## 2023-08-15 MED ORDER — MORPHINE 100MG IN NS 100ML (1MG/ML) PREMIX INFUSION
0.0000 mg/h | INTRAVENOUS | Status: DC
  Administered 2023-08-15: 5 mg/h via INTRAVENOUS
  Filled 2023-08-15: qty 100

## 2023-08-15 MED ORDER — ACETAMINOPHEN 650 MG RE SUPP: 650.0000 mg | Freq: Four times a day (QID) | RECTAL | Status: DC | PRN

## 2023-08-15 MED ORDER — MIDAZOLAM BOLUS VIA INFUSION (WITHDRAWAL LIFE SUSTAINING TX)
2.0000 mg | INTRAVENOUS | Status: DC | PRN
  Administered 2023-08-15: 2 mg via INTRAVENOUS

## 2023-08-15 MED ORDER — MIDAZOLAM HCL 2 MG/2ML IJ SOLN
1.0000 mg | INTRAMUSCULAR | Status: DC | PRN
  Administered 2023-08-15: 1 mg via INTRAVENOUS

## 2023-08-15 MED ORDER — MIDAZOLAM-SODIUM CHLORIDE 100-0.9 MG/100ML-% IV SOLN: 0.0000 mg/h | INTRAVENOUS | Status: DC

## 2023-08-15 MED ORDER — ACETAMINOPHEN 325 MG PO TABS: 650.0000 mg | ORAL_TABLET | Freq: Four times a day (QID) | ORAL | Status: DC | PRN

## 2023-08-15 MED ORDER — SODIUM CHLORIDE 0.9 % IV SOLN: INTRAVENOUS | Status: DC

## 2023-08-15 MED ORDER — MORPHINE BOLUS VIA INFUSION
5.0000 mg | INTRAVENOUS | Status: DC | PRN
  Administered 2023-08-15: 5 mg via INTRAVENOUS

## 2023-08-15 MED ORDER — ACETAMINOPHEN 160 MG/5ML PO SOLN: 650.0000 mg | Freq: Four times a day (QID) | ORAL | Status: DC | PRN

## 2023-08-15 MED ORDER — POLYVINYL ALCOHOL 1.4 % OP SOLN: 1.0000 [drp] | Freq: Four times a day (QID) | OPHTHALMIC | Status: DC | PRN

## 2023-08-15 MED ORDER — GLYCOPYRROLATE 0.2 MG/ML IJ SOLN: 0.2000 mg | INTRAMUSCULAR | Status: DC | PRN

## 2023-08-15 MED ORDER — GLYCOPYRROLATE 1 MG PO TABS: 1.0000 mg | ORAL_TABLET | ORAL | Status: DC | PRN

## 2023-08-25 NOTE — Plan of Care (Signed)
  Problem: Clinical Measurements: Goal: Respiratory complications will improve Outcome: Not Progressing Goal: Cardiovascular complication will be avoided Outcome: Not Progressing   Problem: Coping: Goal: Level of anxiety will decrease Outcome: Not Progressing   Problem: Elimination: Goal: Will not experience complications related to bowel motility Outcome: Not Progressing

## 2023-08-25 NOTE — Progress Notes (Signed)
NAME:  Brooke Sanders, MRN:  [id number], DOB:  1946-01-19, LOS: 9 ADMISSION DATE:  08/06/2023, CONSULTATION DATE:  1/16 REFERRING MD:  Dr. Tedra Senegal, CHIEF COMPLAINT:  SBO   History of Present Illness:  78 year old female with PMH as below, which is significant for remote SBO, HTN, HLD, hypothyroid, and sarcoidosis. She presented to [facility] 1/13 with complaints of nausea, vomiting, and abdominal pain x 3 days. Onset was 1/10 about 3 hours after eating a meal. She assumed this was due to food poisoning. Symptoms progressed to include nausea/vomiting and diarrhea. She then began to no longer have bowel movements or pass gas. She was unable to keep any PO intake including medications down. When symptoms did not resolve she presented to [facility] ED. CT scan showed evidence of small bowel obstruction.  General surgery was consulted and NGT was placed for decompression. Despite conservative management she did not improve and the decision was made to proceed to the OR. 1/16 she underwent ex lap with lysis of adhesions, small bowel resection with primary anastomosis x 3, and right colectomy. The terminal ileum and the abdomen were left in discontinuity and she was transferred to the ICU on the ventilator. PCCM was asked to evaluate for ICU care.   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  Significant Hospital Events: Including procedures, antibiotic start and stop dates in addition to other pertinent events   1/13 admit for SBO with conservative management To OR for adhesion lysis, colectomy, and small bowel resection. Post op on vent with open abd.  1/17 returned to OR 1/18 returned to OR for wash out 1/20-back to the OR, postop notes reviewed 1/22-ongoing goals of care Will transition to comfort measures today  Interim History / Subjective:   No overnight events Ongoing goals of care discussions   Objective   Blood pressure (!) 135/43, pulse 74, temperature 99.5 F (37.5 C), resp. rate (!) 25, height 5' (1.524 m), weight 67.9 kg, SpO2 100%.    Vent Mode: PRVC FiO2 (%):  [30 %] 30 % Set Rate:  [25 bmp] 25 bmp Vt Set:  [360 mL] 360 mL PEEP:  [5 cmH20] 5 cmH20 Plateau Pressure:  [14 cmH20-15 cmH20] 14 cmH20   Intake/Output Summary (Last 24 hours) at 08/04/2023 2951 Last data filed at 08/24/2023 8841 Gross per 24 hour  Intake 2560.63 ml  Output 4110 ml  Net -1549.37 ml   Filed Weights   08/13/23 0400 08/14/23 0400 07/25/2023 0400  Weight: 70.4 kg 67.9 kg 67.9 kg    Examination: General: Frail, intubated, sedated HENT: Moist oral mucosa Lungs: Clear breath sounds bilaterally Cardiovascular: S1 and S2 appreciated  I reviewed nursing notes, Consultant notes,  last 24 h vitals and pain scores, last 48 h intake and output, last 24 h labs and trends, and last 24 h imaging results.  Resolved Hospital Problem list     Assessment & Plan:   Small bowel obstruction -Failed conservative management -OR 1/16, OR 1/24 possible closure.  Was on Zosyn for intra-abdominal  soilage  Hypovolemic versus distributive shock -Resolved  Hypertension -Started IV Lopressor -Pain and agitation management  Respiratory insufficiency  Electrolyte derangement Acute kidney injury -Repleted  Transaminitis -Was resolving  Acute blood loss  anemia Thrombocytopenia -Stable  History of biopsy-proven sarcoidosis -Not on any medications at present  History of hypothyroidism History of restless legs SVT  Ongoing goals of care discussions.  We have had multiple conversations with patient's family members Family members who were out of state have had a chance to visit with the patient  We will proceed with compassionate extubation and transitioning to comfort measures as patient would have desired  Best Practice (right click and "Reselect all SmartList Selections" daily)   Transitioning to comfort measures  Labs   none  Virl Diamond, MD [place] PCCM Pager: See Loretha Stapler

## 2023-08-25 NOTE — Consult Note (Signed)
WOC will sign off, transitioning to CC, one way extubation this am.    Re consult if needed, will not follow at this time. Thanks  Libra Gatz [employer], RN,CWOCN, CNS, CWON-AP [phone])

## 2023-08-25 NOTE — Progress Notes (Signed)
  Progress Note   Date: 08/14/2023  Patient Name: Brooke Sanders        MRN#: [id number]  Clarification of diagnosis: Postoperative vent dependent respiratory failure

## 2023-08-25 NOTE — Progress Notes (Signed)
Chaplain was paged to pray with the family prior to extubation.  Chaplain provided prayer, as well as grief and emotional support to individuals and to the family as a group.  They surrounded Brooke Sanders with love and honored all of the ways she impacted their lives.  Family included [name]'s sister and her spouse as well as [name]'s children and their spouses.  [address], Bcc Pager, [phone]

## 2023-08-25 NOTE — Progress Notes (Signed)
  Progress Note   Date: 08/13/2023  Patient Name: Brooke Sanders        MRN#: [id number]  Clarification of diagnosis:  Moderate malnutrition

## 2023-08-25 NOTE — Procedures (Signed)
Extubation Procedure Note  Patient Details:   Name: Amaiya A. Gest DOB: [birth year] MRN: [id number]   Airway Documentation:    Vent end date: 08/20/2023 Vent end time: 0856  Pt extubated per order to 2L nasal cannula. Pt is now comfort care. Family at bedside  Renold Genta 07/26/2023, 8:59 AM

## 2023-08-25 NOTE — Death Summary Note (Signed)
DEATH SUMMARY   Patient Details  Name: Brooke Sanders MRN: [id number] DOB: 01/15/1946  Admission/Discharge Information   Admit Date:  2023-08-30  Date of Death: Date of Death: 09-08-23  Time of Death: Time of Death: 0921  Length of Stay: 26-Aug-2023  Referring Physician: [place], Betty G, MD   Reason(s) for Hospitalization  Patient was admitted to the hospital with complaints of nausea vomiting abdominal pain of 3 days duration  Diagnoses  Preliminary cause of death:  Small bowel obstruction Secondary Diagnoses (including complications and co-morbidities):  Principal Problem:   SBO (small bowel obstruction) (HCC) Active Problems:   Sarcoidosis   Essential hypertension   Dyslipidemia   History of coronary artery disease   Coronary artery disease involving native coronary artery of native heart without angina pectoris   Atherosclerosis of aorta (HCC)   Respiratory insufficiency   Hypovolemic shock (HCC)   Acute respiratory failure with hypoxia (HCC)   Malnutrition of moderate degree   Brief Hospital Course (including significant findings, care, treatment, and services provided and events leading to death)  Brooke Sanders is a 78 y.o. year old female who admitted to the hospital following complaints of nausea vomiting abdominal pain of 3 days duration Developed constipation, with progressive symptoms decided to present to the hospital Evaluation did reveals evidence of small bowel obstruction, NG tube was placed for decompression.  Despite conservative management, patient did not improve Was taken to the operating room on 1/16-exploratory laparotomy with lysis of adhesions, small bowel resection and primary anastomosis x 3 with right hemicolectomy.  Was taken back to the OR 1/17, return to the OR on 1/18 for washout On 1/20 taken back to the OR for possible closure,-operative notes indicated for resection of both small bowel anastomosis and creation of new small bowel  anastomosis and closure of abdomen with drain placement. Attempts at weaning was unsuccessful as patient will arouse but blood pressure will become uncontrolled, tachycardic, tachypneic. Ongoing goals of care discussions with the family, they reiterated the patient will not want to be kept alive artificially will not want to continue ongoing care if her quality of life will not improve to premorbid levels. Family members did come into visit patient and further goals of care discussions was had with the patients family, involved palliative care.  Decision was made in the best interest of the patient to transition to comfort measures. Patient was transition to comfort measures only and succumbed to her illness at 72 on 09/08/23     Pertinent Labs and Studies  Significant Diagnostic Studies DG CHEST PORT 1 VIEW Result Date: 08/12/2023 CLINICAL DATA:  78 year old female with history of respiratory failure. EXAM: PORTABLE CHEST 1 VIEW COMPARISON:  Chest x-ray 08/10/2023. FINDINGS: An endotracheal tube is in place with tip 4.7 cm above the carina. Nasogastric tube extends into the body of the stomach with the side port just distal to the gastroesophageal junction. There is a right-sided internal jugular central venous catheter with tip terminating in the right atrium. Lung volumes are slightly low. Patchy areas of interstitial prominence and peribronchial cuffing as well as some patchy ill-defined opacities are noted throughout the lungs bilaterally, most evident throughout the left mid lung and right lung base, concerning for multilobar bilateral bronchopneumonia. No definite pleural effusions. No pneumothorax. No evidence of pulmonary edema. Heart size is normal. Upper mediastinal contours are within normal limits. Atherosclerotic calcifications are noted in the thoracic aorta. IMPRESSION: 1. Support apparatus, as above. 2. The appearance of the chest is  concerning for multilobar bilateral  bronchopneumonia, as above. 3. Aortic atherosclerosis. Electronically Signed   By: Trudie Reed M.D.   On: 08/12/2023 13:22   DG CHEST PORT 1 VIEW Result Date: 08/10/2023 CLINICAL DATA:  Hypoxia EXAM: PORTABLE CHEST 1 VIEW COMPARISON:  08/09/2023 FINDINGS: Endotracheal tube, NG tube and right central line remain in place, unchanged. Linear opacities in the lung bases compatible with atelectasis. No effusions or pneumothorax. Heart and mediastinal contours within normal limits. No acute bony abnormality. IMPRESSION: Bibasilar atelectasis. No active disease. Electronically Signed   By: Charlett Nose M.D.   On: 08/10/2023 17:24   [place] EKG SITE RITE Result Date: 08/10/2023 If Site Rite image not attached, placement could not be confirmed due to current cardiac rhythm.  DG CHEST PORT 1 VIEW Result Date: 08/09/2023 CLINICAL DATA:  Intubated EXAM: PORTABLE CHEST 1 VIEW COMPARISON:  09/06/2022 FINDINGS: Endotracheal tube tip is about 4.2 cm superior to the carina. Esophageal tube tip within the proximal stomach. Subsegmental atelectasis in the right lower lung. Probable small left effusion with left basilar atelectasis. Right IJ central venous catheter tip at the cavoatrial region. Normal cardiac size with aortic atherosclerosis IMPRESSION: 1. Endotracheal tube tip about 4.2 cm superior to the carina. Esophageal tube tip within the proximal stomach. 2. Probable small left effusion with left basilar atelectasis. Subsegmental atelectasis in the right lower lung. Electronically Signed   By: Jasmine Pang M.D.   On: 08/09/2023 19:28   DG Abd Portable 1V Result Date: 08/08/2023 CLINICAL DATA:  [id number] SBO (small bowel obstruction) (HCC) [id number] EXAM: PORTABLE ABDOMEN - 1 VIEW COMPARISON:  08/07/2023 FINDINGS: Enteric tube remains positioned in the proximal stomach, side port at the level of the GE junction. Increasing caliber of dilated small bowel loops now measuring up to 6.2 cm in diameter. Dilute contrast is seen  within dilated small bowel. No gross free intraperitoneal air on portable supine view. IMPRESSION: 1. Increasing caliber of dilated small bowel loops, compatible with progression of small bowel obstruction. 2. Enteric tube remains positioned in the proximal stomach, side port at the level of the GE junction. Consider advancement. Electronically Signed   By: Duanne Guess D.O.   On: 08/08/2023 09:41   DG Abd Portable 1V-Small Bowel Obstruction Protocol-initial, 8 hr delay Result Date: 08/07/2023 CLINICAL DATA:  Small bowel protocol, 8 hour post contrast filled EXAM: PORTABLE ABDOMEN - 1 VIEW COMPARISON:  08/06/2023 FINDINGS: NG tube tip is near the GE junction. Dilated small bowel loops are again noted measuring up to 4.6 cm in diameter. Contrast material seen within dilated small bowel loops. No contrast within the colon. IMPRESSION: Continued small bowel obstruction pattern. No contrast yet within the colon. Electronically Signed   By: Charlett Nose M.D.   On: 08/07/2023 09:23   DG Abd Portable 1 View Result Date: 08/06/2023 CLINICAL DATA:  verify ng placement EXAM: PORTABLE ABDOMEN - 1 VIEW COMPARISON:  CT abdomen pelvis 08/06/2023 FINDINGS: Enteric tube coursing below the diaphragm with tip overlying the expected region of the gastric lumen and side port overlying the expected region of the gastroesophageal junction. The heart and mediastinal contours are within normal limits. Of sclerotic plaque. No focal consolidation. No pulmonary edema. No pleural effusion. No pneumothorax. Multiple loops of small bowel dilated with gas within the upper abdomen. No acute osseous abnormality. IMPRESSION: 1. Enteric tube coursing below the diaphragm with tip overlying the expected region of the gastric lumen and side port overlying the expected region of the gastroesophageal junction. Recommend  advancement by 2 cm. 2. Small-bowel obstruction. Electronically Signed   By: Tish Frederickson M.D.   On: 08/06/2023 18:06   CT  ABDOMEN PELVIS W CONTRAST Result Date: 08/06/2023 CLINICAL DATA:  Diffuse abdominal distention. Abdominal pain. Nonlocalized. EXAM: CT ABDOMEN AND PELVIS WITH CONTRAST TECHNIQUE: Multidetector CT imaging of the abdomen and pelvis was performed using the standard protocol following bolus administration of intravenous contrast. RADIATION DOSE REDUCTION: This exam was performed according to the departmental dose-optimization program which includes automated exposure control, adjustment of the mA and/or kV according to patient size and/or use of iterative reconstruction technique. CONTRAST:  80mL OMNIPAQUE IOHEXOL 300 MG/ML  SOLN COMPARISON:  None Available. FINDINGS: Lower chest: Bibasilar subsegmental atelectasis. Mild cardiomegaly, without pericardial or pleural effusion. Tiny hiatal hernia. Hepatobiliary: Normal liver. Cholecystectomy. Mild intrahepatic biliary duct dilatation. Moderate extrahepatic duct dilatation at 2.0 cm on coronal image 47. Followed to the level of the ampulla with gradual tapering. No calcified stone or obstructive mass identified. Pancreas: Mild pancreatic atrophy. No duct dilatation or acute inflammation. Spleen: Normal in size, without focal abnormality. Adrenals/Urinary Tract: Normal adrenal glands. Normal kidneys, without hydronephrosis. Normal urinary bladder. Stomach/Bowel: Normal remainder of the stomach. The colon is relatively decompressed especially distally Mid ileal loops are dilated and fluid-filled including up to 4.1 cm in 31/2. There are at least 2 small bowel transitions occurring adjacent to each other in the pelvis. The most well-defined is identified on coronal image 50 through 37. Just inferior and anterior to this, a second transition is identified on images 59 through 53 coronal. There may be a more caudal transition with small bowel feces sign on 47 through 43 of series 5 coronal. There is a suspicion of mesenteric edema within the pelvis including on 65/2. No  pneumatosis or free intraperitoneal air. Vascular/Lymphatic: Aortic atherosclerosis. Retroaortic left renal vein. No abdominopelvic adenopathy. Reproductive: Hysterectomy.  No adnexal mass. Other: Small volume abdominopelvic ascites. Musculoskeletal: Soft tissue density in the inferior right breast on 01/02 likely represents asymmetric parenchyma when correlated with 04/08/2023 chest CT (negative screening mammogram 09/28/2022). Osteopenia. L4-5 trans pedicle screw fixation. Trace L4-5 anterolisthesis. Lumbar spondylosis. IMPRESSION: 1. Small-bowel obstruction with multiple adjacent transitions within the pelvis, suspicious for closed loop obstruction and possible internal hernia. Recommend surgical consultation. 2. Small volume abdominopelvic ascites with suggestion of mesenteric edema adjacent to the presumed close loop obstruction. Cannot exclude complicating ischemia. 3. Cholecystectomy with biliary duct dilatation. Correlate with bilirubin levels and if elevated consider nonemergent outpatient pre and post-contrast MRCP. 4.  Tiny hiatal hernia. 5.  Aortic Atherosclerosis (ICD10-I70.0). These results will be called to the ordering clinician or representative by the Radiologist Assistant, and communication documented in the PACS or [employer]. Electronically Signed   By: Jeronimo Greaves M.D.   On: 08/06/2023 12:48    Microbiology Recent Results (from the past 240 hours)  Surgical pcr screen     Status: Abnormal   Collection Time: 08/09/23 10:07 AM   Specimen: Nasal Mucosa; Nasal Swab  Result Value Ref Range Status   MRSA, PCR NEGATIVE NEGATIVE Final   Staphylococcus aureus POSITIVE (A) NEGATIVE Final    Comment: (NOTE) The Xpert SA Assay (FDA approved for NASAL specimens in patients 8 years of age and older), is one component of a comprehensive surveillance program. It is not intended to diagnose infection nor to guide or monitor treatment. Performed at [facility], [address], [place], [place] [address]   MRSA Next Gen by PCR, Nasal  Status: None   Collection Time: 08/09/23  3:12 PM   Specimen: Nasal Mucosa; Nasal Swab  Result Value Ref Range Status   MRSA by PCR Next Gen NOT DETECTED NOT DETECTED Final    Comment: (NOTE) The GeneXpert MRSA Assay (FDA approved for NASAL specimens only), is one component of a comprehensive MRSA colonization surveillance program. It is not intended to diagnose MRSA infection nor to guide or monitor treatment for MRSA infections. Test performance is not FDA approved in patients less than 55 years old. Performed at [facility], [address] [place], [place] [address]   Culture, Respiratory w Gram Stain     Status: None   Collection Time: 08/12/23  2:28 PM   Specimen: Tracheal Aspirate; Respiratory  Result Value Ref Range Status   Specimen Description   Final    TRACHEAL ASPIRATE Performed at [facility], [address] [place], [place] [address]    Special Requests   Final    NONE Performed at [facility], [address] [place], [place] [address]    Gram Stain   Final    FEW WBC PRESENT, PREDOMINANTLY MONONUCLEAR NO ORGANISMS SEEN    Culture   Final    NO GROWTH 2 DAYS Performed at [facility], [address] [place], [place] [address]    Report Status 08/14/2023 FINAL  Final    Lab Basic Metabolic Panel: Recent Labs  Lab 08/10/23 0409 08/10/23 2024 08/11/23 0119 08/11/23 1107 08/12/23 0305 08/13/23 0348 08/14/23 0341  NA 144   < > 138 138 137 142 137  K 5.0   < > 4.7 3.8 3.6 3.5 3.3*  CL 114*   < > 106 103 104 107 106  CO2 22   < > 22 24 26 27 24   GLUCOSE 117*   < > 273* 114* 145* 157* 119*  BUN 41*   < > 45* 42* 51* 63* 68*  CREATININE 2.27*   < > 2.00* 1.88* 2.20* 2.10* 1.89*  CALCIUM 7.0*   < > 5.8* 6.0* 6.4* 6.7* 6.4*  MG 1.9  --  1.7  --  1.8 2.0 1.8  PHOS 2.4*  --  5.0*  --  4.3 2.5 2.2*   < > = values in  this interval not displayed.   Liver Function Tests: Recent Labs  Lab 08/09/23 0515 08/09/23 1539 08/10/23 0409 08/13/23 0348 08/14/23 0341  AST 12* 19 19 383* 186*  ALT 11 13 11  523* 322*  ALKPHOS 34* 37* 25* 60 104  BILITOT 0.6 0.6 0.5 0.6 0.5  PROT 5.9* 5.1* 4.7* 3.7* 3.8*  ALBUMIN 3.2* 3.2* 2.6* <1.5* <1.5*   No results for input(s): "LIPASE", "AMYLASE" in the last 168 hours. No results for input(s): "AMMONIA" in the last 168 hours. CBC: Recent Labs  Lab 08/11/23 0119 08/11/23 1107 08/11/23 1839 08/12/23 0305 08/13/23 0348 08/14/23 0341  WBC 21.0* 16.5*  --  23.7* 18.1* 18.8*  NEUTROABS 18.2* 14.3*  --  21.7* 16.1* 16.2*  HGB 4.9* 6.8* 8.6* 10.4* 9.2* 10.1*  HCT 14.9* 19.4* 24.6* 30.4* 27.8* 31.0*  MCV 94.9 88.2  --  89.7 92.7 94.2  PLT 74* 61*  --  63* 47* 53*   Cardiac Enzymes: No results for input(s): "CKTOTAL", "CKMB", "CKMBINDEX", "TROPONINI" in the last 168 hours. Sepsis Labs: Recent Labs  Lab 08/09/23 1519 08/09/23 1539 08/10/23 2024 08/10/23 2302 08/11/23 0119 08/11/23 1107 08/12/23 0305 08/13/23 0348 08/14/23 0341  WBC  --    < >  36.2*  --    < > 16.5* 23.7* 18.1* 18.8*  LATICACIDVEN 1.2  --  8.4* 5.6*  --   --   --   --   --    < > = values in this interval not displayed.    Procedures/Operations  Exploratory laparotomy 08/10/2023, 08/13/2023   Brooke Sanders A Landa Mullinax 07/29/2023, 5:16 PM

## 2023-08-25 DEATH — deceased

## 2023-09-07 IMAGING — MG MM DIGITAL SCREENING BILAT W/ TOMO AND CAD
8 series · 9 of 24 positions shown · non-contrast
Comparison: Previous exam(s).

CLINICAL DATA: Screening.

EXAM:
DIGITAL SCREENING BILATERAL MAMMOGRAM WITH TOMOSYNTHESIS AND CAD
TECHNIQUE: Bilateral screening digital craniocaudal and mediolateral oblique
mammograms were obtained. Bilateral screening digital breast
tomosynthesis was performed. The images were evaluated with
computer-aided detection.

[L MLO synth-2D]
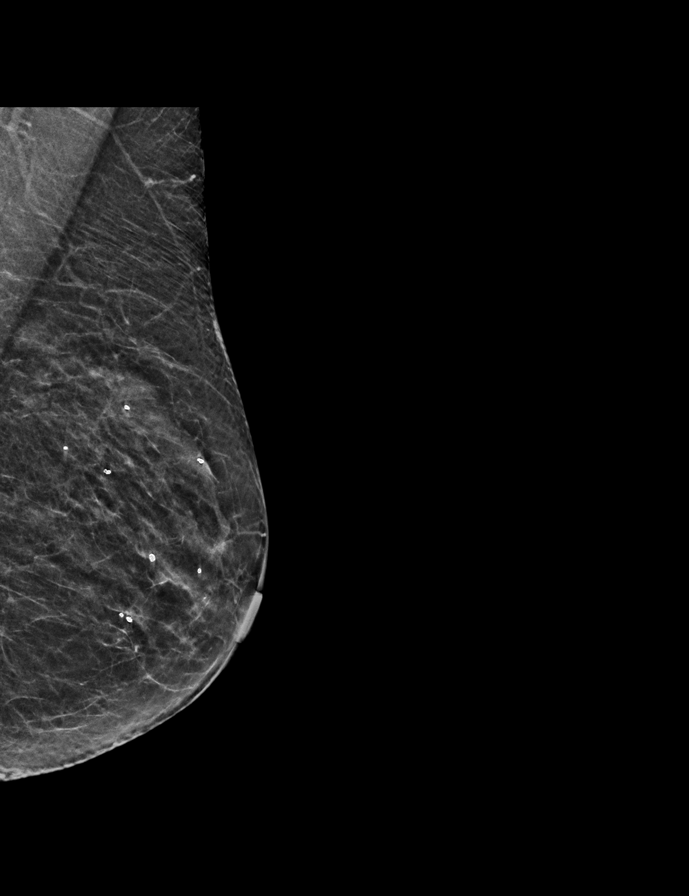

[R MLO synth-2D]
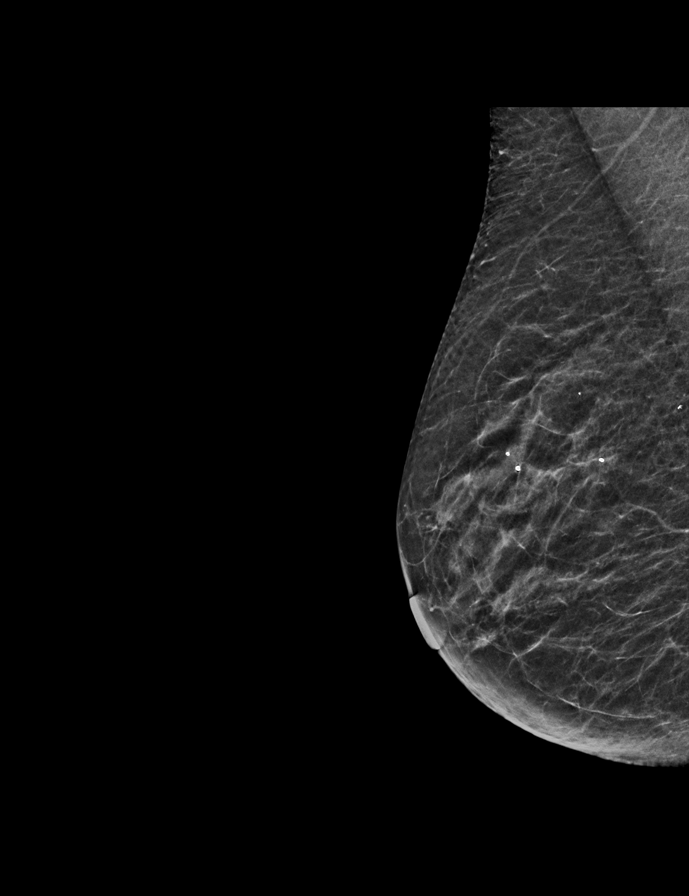

[R CC synth-2D]
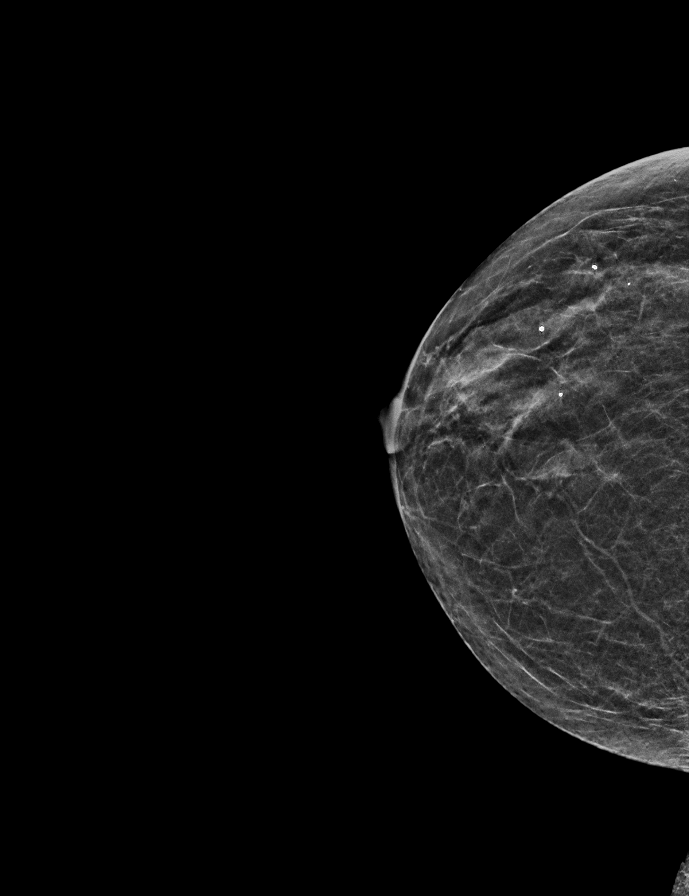

[L CC synth-2D]
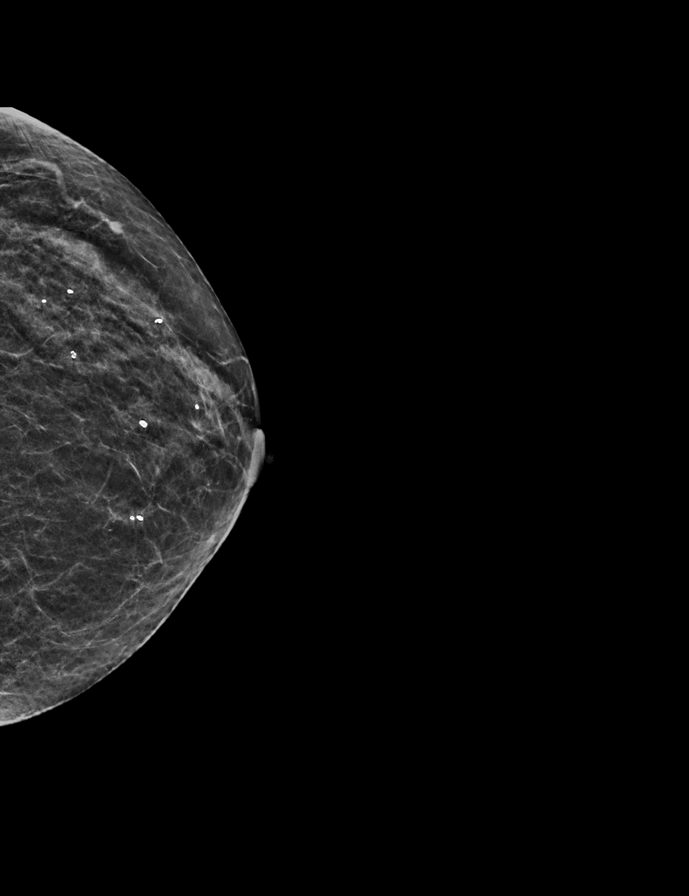

[L CC tomo · 2 of 38 frames shown]
[frame 13/38]
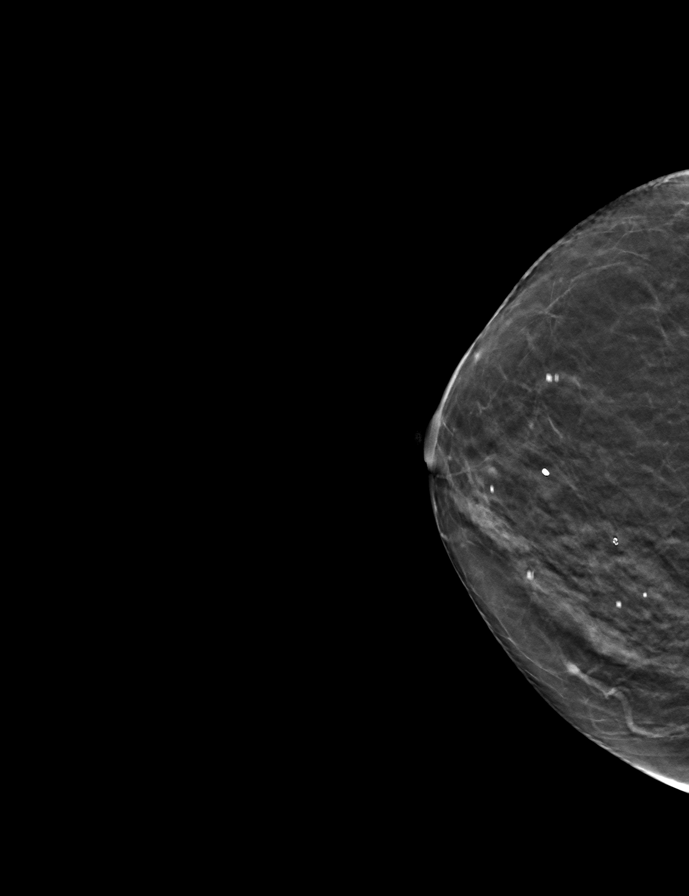
[frame 19/38]
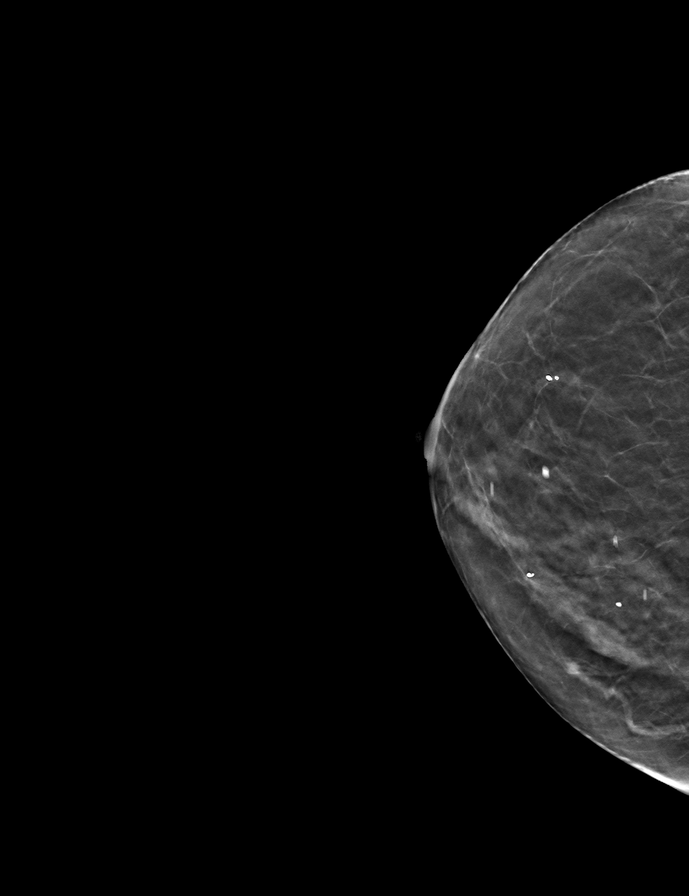

[R CC tomo · tomo slice 19/38.0]
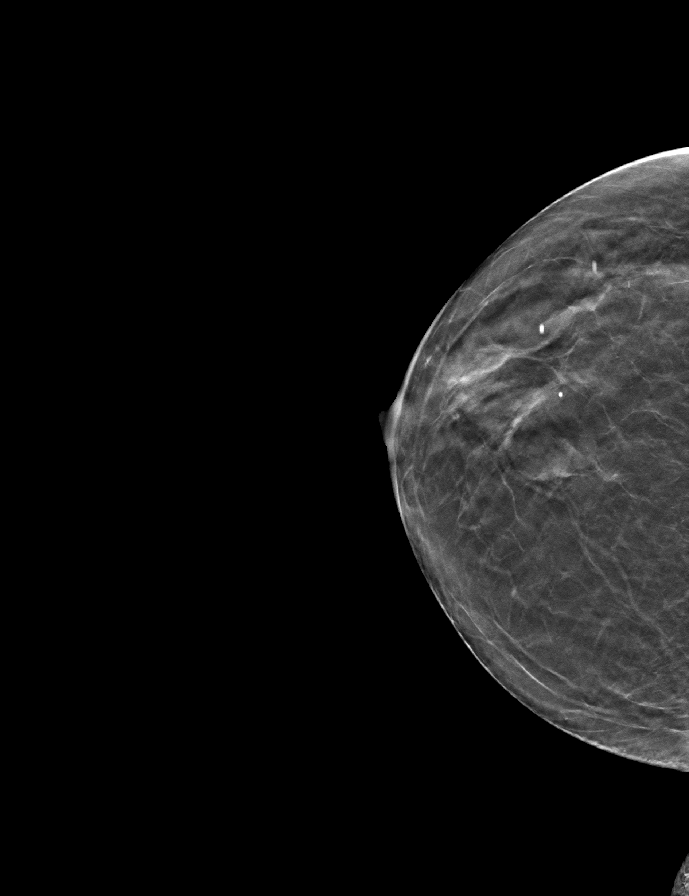

[R MLO tomo · tomo slice 21/41.0]
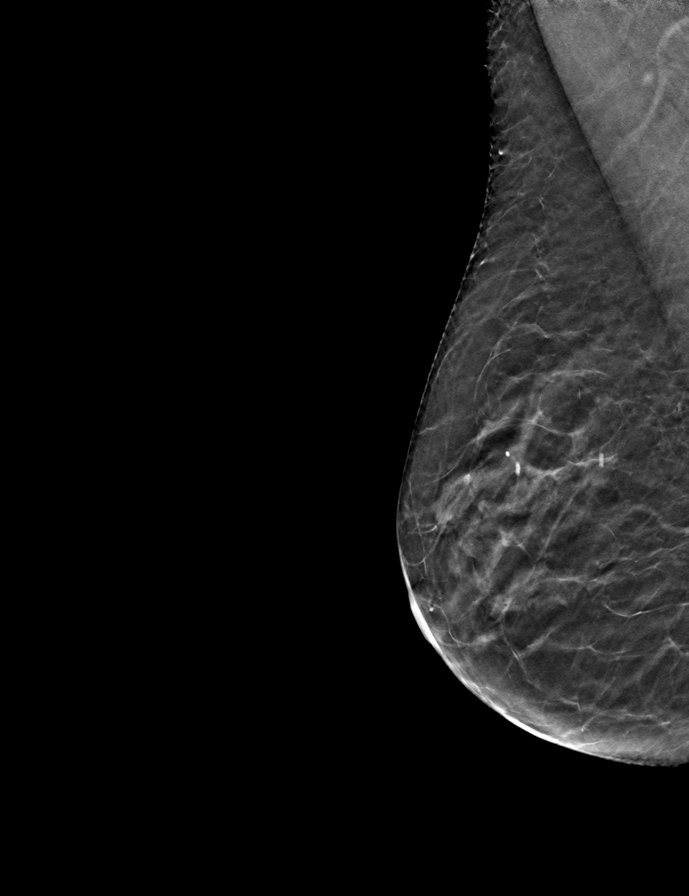

[L MLO tomo · tomo slice 21/42.0]
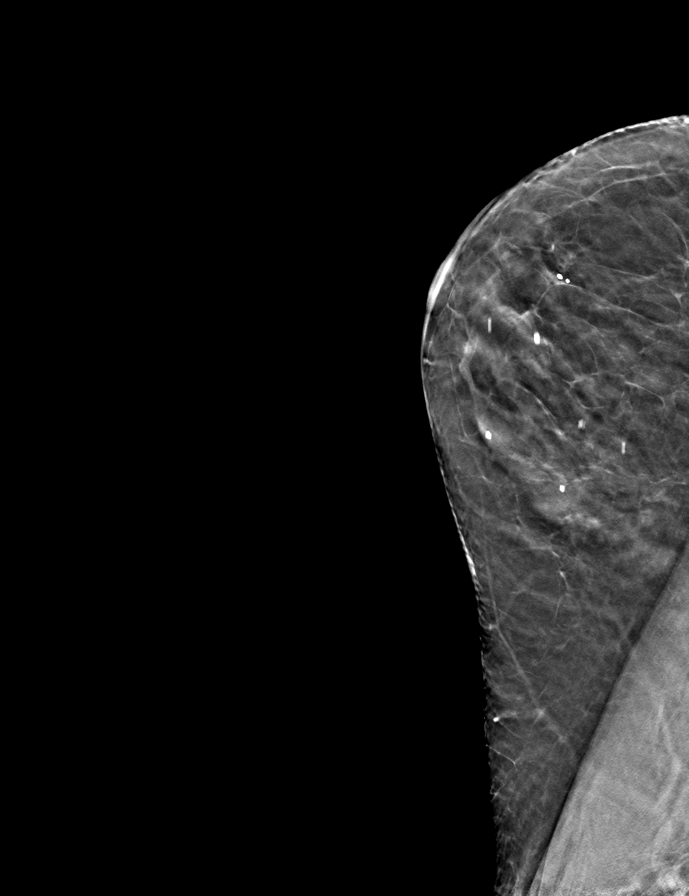

[9 of 24 positions shown; findings below may reference images not displayed]

ACR Breast Density Category b: There are scattered areas of
fibroglandular density.
FINDINGS: There are no findings suspicious for malignancy.
IMPRESSION: No mammographic evidence of malignancy. A result letter of this
screening mammogram will be mailed directly to the patient.

RECOMMENDATION:
Screening mammogram in one year. (Code:51-O-LD2)

BI-RADS CATEGORY  1: Negative.

## 2023-11-15 ENCOUNTER — Encounter: Payer: Medicare Other | Admitting: Family Medicine
